# Patient Record
Sex: Male | Born: 1945 | Race: Black or African American | Hispanic: No | Smoking: Never smoker
Health system: Southern US, Community
[De-identification: ages and names within clinical notes are randomized; demographics above are authoritative.]

## PROBLEM LIST (undated history)

## (undated) DIAGNOSIS — M1990 Unspecified osteoarthritis, unspecified site: Secondary | ICD-10-CM

## (undated) DIAGNOSIS — N400 Enlarged prostate without lower urinary tract symptoms: Secondary | ICD-10-CM

## (undated) HISTORY — PX: TONSILLECTOMY: SUR1361

## (undated) HISTORY — PX: CYST REMOVAL NECK: SHX6281

## (undated) HISTORY — DX: Benign prostatic hyperplasia without lower urinary tract symptoms: N40.0

## (undated) HISTORY — PX: BIOPSY PROSTATE: PRO28

## (undated) HISTORY — PX: CHOLECYSTECTOMY: SHX55

---

## 2000-05-06 ENCOUNTER — Ambulatory Visit (HOSPITAL_COMMUNITY): Admission: RE | Admit: 2000-05-06 | Discharge: 2000-05-06 | Payer: Self-pay | Admitting: *Deleted

## 2002-01-28 ENCOUNTER — Emergency Department (HOSPITAL_COMMUNITY): Admission: EM | Admit: 2002-01-28 | Discharge: 2002-01-28 | Payer: Self-pay | Admitting: Emergency Medicine

## 2002-01-28 ENCOUNTER — Encounter: Payer: Self-pay | Admitting: Emergency Medicine

## 2016-12-08 ENCOUNTER — Encounter: Payer: Self-pay | Admitting: Family Medicine

## 2016-12-08 ENCOUNTER — Ambulatory Visit (INDEPENDENT_AMBULATORY_CARE_PROVIDER_SITE_OTHER): Payer: Medicare Other | Admitting: Family Medicine

## 2016-12-08 DIAGNOSIS — R351 Nocturia: Secondary | ICD-10-CM

## 2016-12-08 DIAGNOSIS — N401 Enlarged prostate with lower urinary tract symptoms: Secondary | ICD-10-CM | POA: Diagnosis not present

## 2016-12-08 DIAGNOSIS — E669 Obesity, unspecified: Secondary | ICD-10-CM

## 2016-12-08 DIAGNOSIS — R012 Other cardiac sounds: Secondary | ICD-10-CM | POA: Diagnosis not present

## 2016-12-08 DIAGNOSIS — E66811 Obesity, class 1: Secondary | ICD-10-CM

## 2016-12-08 DIAGNOSIS — N400 Enlarged prostate without lower urinary tract symptoms: Secondary | ICD-10-CM

## 2016-12-08 HISTORY — DX: Benign prostatic hyperplasia without lower urinary tract symptoms: N40.0

## 2016-12-08 NOTE — Assessment & Plan Note (Addendum)
Check EKG today; will refer for echocardiogram

## 2016-12-08 NOTE — Patient Instructions (Addendum)
Let's request your vaccine record from the last provider Return for a Medicare Wellness visit in the next few months Start a coated baby 81 mg aspirin daily for heart protection

## 2016-12-08 NOTE — Assessment & Plan Note (Addendum)
managed by urologist

## 2016-12-08 NOTE — Progress Notes (Signed)
BP 112/78   Pulse 91   Temp 97.6 F (36.4 C) (Oral)   Resp 14   Ht 5\' 10"  (1.778 m)   Wt 209 lb 12.8 oz (95.2 kg)   SpO2 96%   BMI 30.10 kg/m    Subjective:    Patient ID: Colin Barron, male    DOB: 01-Apr-1946, 71 y.o.   MRN: [id number]  HPI: Colin Barron is a 71 y.o. male  Chief Complaint  Patient presents with  . Establish Care    HPI Patient is here to establish care He really does not have much in the way of major health issues His last provider moved  He takes medicine for his prostate, flomax; he goes every six months to see the specialist; his prostate is usually swollen but last time, everything was good; they check his urine and blood work there for that; they have done a couple of biopsies; [facility]  Today's BP is excellent; never had a problem with his BP; usually 117/76 or something close  He gets flu shots every year; he thinks when he turned [age], he already got the flu shot, not absolutely certain  [facility] 12/08/2016  Decreased Interest 0  Down, Depressed, Hopeless 0  PHQ - 2 Score 0   Relevant past medical, surgical, family and social history reviewed Past Medical History:  Diagnosis Date  . BPH (benign prostatic hyperplasia) 12/08/2016    Past Surgical History:  Procedure Laterality Date  . BIOPSY PROSTATE    . CYST REMOVAL NECK    . TONSILLECTOMY     Family History  Problem Relation Age of Onset  . Diabetes Mother   . Hypertension Mother   . Intracerebral hemorrhage Brother   . Hypertension Brother   . Stroke Maternal Grandmother   . Stroke Maternal Grandfather   . Hypertension Brother        POSS   Social History   Social History  . Marital status: Divorced    Spouse name: N/A  . Number of children: N/A  . Years of education: N/A   Occupational History  . Not on file.   Social History Main Topics  . Smoking status: Never Smoker  . Smokeless tobacco: Never Used  . Alcohol use  No  . Drug use: No  . Sexual activity: Yes   Other Topics Concern  . Not on file   Social History Narrative  . No narrative on file    Interim medical history since last visit reviewed. Allergies and medications reviewed  Review of Systems Per HPI unless specifically indicated above     Objective:    BP 112/78   Pulse 91   Temp 97.6 F (36.4 C) (Oral)   Resp 14   Ht 5\' 10"  (1.778 m)   Wt 209 lb 12.8 oz (95.2 kg)   SpO2 96%   BMI 30.10 kg/m   Wt Readings from Last 3 Encounters:  12/08/16 209 lb 12.8 oz (95.2 kg)    Physical Exam  Constitutional: He appears well-developed and well-nourished. No distress.  HENT:  Head: Normocephalic and atraumatic.  Eyes: EOM are normal. No scleral icterus.  Neck: No thyromegaly present.  Cardiovascular: Normal rate and regular rhythm.   Pulmonary/Chest: Effort normal and breath sounds normal.  Abdominal: Soft. Bowel sounds are normal. He exhibits no distension.  Musculoskeletal: He exhibits no edema.  Neurological: Coordination normal.  Skin: Skin is warm and dry. No pallor.  Psychiatric: He has a normal mood and affect. His behavior is normal. Judgment and thought content normal.   No results found for this or any previous visit.    Assessment & Plan:   Problem List Items Addressed This Visit      Genitourinary   BPH (benign prostatic hyperplasia)    managed by urologist      Relevant Medications   tamsulosin (FLOMAX) 0.4 MG CAPS capsule     Other   Obesity (BMI 30.0-34.9)    Encouraged modest weight loss to help prevent problems down the road      Split S2 (second heart sound)    Check EKG today; will refer for echocardiogram      Relevant Orders   EKG 12-Lead (Completed)   ECHOCARDIOGRAM COMPLETE       Follow up plan: Return in about 2 months (around 02/07/2017) for Medicare Wellness check.  An after-visit summary was printed and given to the patient at check-out.  Please see the patient instructions  which may contain other information and recommendations beyond what is mentioned above in the assessment and plan.  Meds ordered this encounter  Medications  . tamsulosin (FLOMAX) 0.4 MG CAPS capsule    Sig: Take 0.4 mg by mouth daily.  . Multiple Vitamin (MULTIVITAMIN) tablet    Sig: Take 1 tablet by mouth daily.  Marland Kitchen aspirin EC 81 MG tablet    Sig: Take 1 tablet (81 mg total) by mouth daily.    Orders Placed This Encounter  Procedures  . EKG 12-Lead  . ECHOCARDIOGRAM COMPLETE

## 2016-12-08 NOTE — Assessment & Plan Note (Signed)
Encouraged modest weight loss to help prevent problems down the road

## 2016-12-17 ENCOUNTER — Telehealth: Payer: Self-pay | Admitting: Family Medicine

## 2016-12-17 NOTE — Telephone Encounter (Signed)
I decided to get an echo for the split S2; left detailed message; I have ordered an echo to investigate the heart sound noted during his exam; do not think anything is worrisome but want to investigate; please call with questions or comments

## 2017-02-07 ENCOUNTER — Ambulatory Visit: Payer: Medicare Other

## 2017-02-08 ENCOUNTER — Telehealth: Payer: Self-pay | Admitting: Family Medicine

## 2017-02-28 ENCOUNTER — Ambulatory Visit: Payer: Medicare Other

## 2017-03-04 ENCOUNTER — Ambulatory Visit: Payer: Medicare Other

## 2017-06-01 NOTE — Telephone Encounter (Signed)
Visit complete.

## 2017-11-24 ENCOUNTER — Telehealth: Payer: Self-pay

## 2017-11-24 NOTE — Telephone Encounter (Signed)
Called pt to sched AWV w/ [facility]. LVM requesting returned call.  

## 2017-12-05 ENCOUNTER — Ambulatory Visit (INDEPENDENT_AMBULATORY_CARE_PROVIDER_SITE_OTHER): Payer: Medicare Other | Admitting: Family Medicine

## 2017-12-05 ENCOUNTER — Encounter: Payer: Self-pay | Admitting: Family Medicine

## 2017-12-05 DIAGNOSIS — Z0000 Encounter for general adult medical examination without abnormal findings: Secondary | ICD-10-CM | POA: Diagnosis not present

## 2017-12-05 NOTE — Assessment & Plan Note (Signed)
Offered to check lipids and glucose; patient declined labs recommended today

## 2017-12-05 NOTE — Progress Notes (Signed)
Patient: Colin Barron, Male    DOB: 11/21/1945, 72 y.o.   MRN: [id number]  Visit Date: 12/05/2017  Today's Provider: Baruch Gouty, MD   Chief Complaint  Patient presents with  . Medicare Wellness    Subjective:   Colin Barron is a 72 y.o. male who presents today for his Subsequent Annual Wellness Visit.  Patient was seen last August; no medical excitement since then reported  Noticed a place of discoloration to left side of face that he feels has spread; states started 9 months ago but is not as visible now. Not itching or painful. Childhood acne  USPSTF grade A and B recommendations Depression:  Depression screen [facility] 2/9 12/05/2017 12/08/2016  Decreased Interest 0 0  Down, Depressed, Hopeless 0 0  PHQ - 2 Score 0 0   Hypertension: BP Readings from Last 3 Encounters:  12/05/17 116/72  12/08/16 112/78   Obesity: Wt Readings from Last 3 Encounters:  12/05/17 205 lb 14.4 oz (93.4 kg)  12/08/16 209 lb 12.8 oz (95.2 kg)   BMI Readings from Last 3 Encounters:  12/05/17 28.72 kg/m  12/08/16 30.10 kg/m    Immunizations: States he got a pneumonia vaccine at 16; discussed vaccines Skin cancer: no history of such; is not wear sunscreen but is not in the sun a lot.  Lung cancer:  No history of smoking, does not qualify  Prostate cancer:  Dad and brother have prostate cancer; follows up with urology every 6 months for surveillance.- takes flomax PRN for flow issues.  No results found for: PSA Colorectal cancer: States last colonscopy was 6 years ago from [facility] and was negative; mother had cancer, but "carcinoma", not sure if colon; see AVS AAA: past age Aspirin: states was taking it in the past but has forgotten to take it. Diet: Started eating more fruits and vegetables, eats chicken, fish, beans. Eats about 1-2 fruits or vegetables a day.  Drinks water and flavored drinks like koolaid 2-3 times a week.  Exercise: walks and mows the yard and works around the house;  states active 7 days a week about 45 minutes.  Alcohol: never Tobacco use:  never HIV, hep B, hep C: sexually active- is in a monogamous relationship with one person, declines testing STD testing and prevention (chl/gon/syphilis): declines  Glucose:  No results found for: GLUCOSE, GLUCAP Lipids:  No results found for: CHOL No results found for: HDL No results found for: LDLCALC No results found for: TRIG No results found for: CHOLHDL No results found for: LDLDIRECT   Review of Systems  Past Medical History:  Diagnosis Date  . BPH (benign prostatic hyperplasia) 12/08/2016    Past Surgical History:  Procedure Laterality Date  . BIOPSY PROSTATE    . CYST REMOVAL NECK    . TONSILLECTOMY      Family History  Problem Relation Age of Onset  . Diabetes Mother   . Hypertension Mother   . Intracerebral hemorrhage Brother   . Hypertension Brother   . Stroke Maternal Grandmother   . Stroke Maternal Grandfather   . Hypertension Brother        POSS    Social History   Tobacco Use  . Smoking status: Never Smoker  . Smokeless tobacco: Never Used  Substance Use Topics  . Alcohol use: No  . Drug use: No    Outpatient Encounter Medications as of 12/05/2017  Medication Sig Note  . tamsulosin (FLOMAX) 0.4 MG CAPS capsule Take 0.4 mg by mouth  daily.   . [DISCONTINUED] aspirin EC 81 MG tablet Take 1 tablet (81 mg total) by mouth daily. 12/05/2017: forgets to take it  . Multiple Vitamin (MULTIVITAMIN) tablet Take 1 tablet by mouth daily.    No facility-administered encounter medications on file as of 12/05/2017.     Functional Ability / Safety Screening 1.  Was the timed Get Up and Go test less than 12 seconds?  yes 2.  Does the patient need help with the phone, transportation, shopping,      preparing meals, housework, laundry, medications, or managing money?  no 3.  Does the patient's home have:  loose throw rugs in the hallway?   no      Grab bars in the bathroom? no       Handrails on the stairs?   yes      Good lighting?   yes 4.  Has the patient noticed any hearing difficulties?   no  Advanced Directives Does patient have a HCPOA?    no If yes, name and contact information: - states would not like to discuss this at this time.  Does patient have a living will or MOST form?  no  Immunizations: may need PCV-13, shringix  Fall Risk Assessment See under rooming  Depression Screen See under rooming [facility] screen [facility] 12/05/2017 12/08/2016  Decreased Interest 0 0  Down, Depressed, Hopeless 0 0  PHQ - 2 Score 0 0    Objective:   Vitals: BP 116/72   Pulse 75   Temp 97.8 F (36.6 C)   Ht 5\' 11"  (1.803 m)   Wt 205 lb 14.4 oz (93.4 kg)   SpO2 96%   BMI 28.72 kg/m  Body mass index is 28.72 kg/m. No exam data present  Physical Exam Mood/affect:  Calm, good eye contact with examiner; euthymic Appearance:  Neatly dressed, casual, good hygiene  No flowsheet data found.  Assessment & Plan:     Annual Wellness Visit  Reviewed patient's Family Medical History Reviewed and updated list of patient's medical providers Assessment of cognitive impairment was done Assessed patient's functional ability Established a written schedule for health screening services Health Risk Assessent Completed and Reviewed  Exercise Activities and Dietary recommendations Goals    . DIET - EAT MORE FRUITS AND VEGETABLES (pt-stated)       Immunization History  Administered Date(s) Administered  . Influenza, High Dose Seasonal PF 01/28/2017    Health Maintenance  Topic Date Due  . INFLUENZA VACCINE  01/05/2018 (Originally 11/24/2017)  . Hepatitis C Screening  12/06/2018 (Originally [birth year])  . PNA vac Low Risk Adult (1 of 2 - PCV13) 12/06/2018 (Originally 04/22/2010)  . COLONOSCOPY  04/26/2021  . TETANUS/TDAP  04/26/2026    Discussed health benefits of physical activity, and encouraged him to engage in regular exercise appropriate for his age and  condition.   No orders of the defined types were placed in this encounter.   Current Outpatient Medications:  .  tamsulosin (FLOMAX) 0.4 MG CAPS capsule, Take 0.4 mg by mouth daily., Disp: , Rfl:  .  Multiple Vitamin (MULTIVITAMIN) tablet, Take 1 tablet by mouth daily., Disp: , Rfl:  Medications Discontinued During This Encounter  Medication Reason  . aspirin EC 81 MG tablet Patient Preference    Next Medicare Wellness Visit in 12+ months  Problem List Items Addressed This Visit      Other   Medicare annual wellness visit, subsequent    USPSTF grade A and B recommendations reviewed  with patient; age-appropriate recommendations, preventive care, screening tests, etc discussed and encouraged; healthy living encouraged; see AVS for patient education given to patient

## 2017-12-05 NOTE — Assessment & Plan Note (Signed)
USPSTF grade A and B recommendations reviewed with patient; age-appropriate recommendations, preventive care, screening tests, etc discussed and encouraged; healthy living encouraged; see AVS for patient education given to patient  

## 2017-12-05 NOTE — Patient Instructions (Addendum)
I will recommend the pneumonia vaccine called Prevnar (PCV-13) You can talk to your pharmacist about it and get it with your flu shot if desired  Consider getting the new shingles vaccine called Shingrix; that is available for individuals 73 years of age and older, and is recommended even if you have had shingles in the past and/or already received the old shingles vaccine (Zostavax); it is a two-part series, and is available at many local pharmacies  Check to see if your mother had colon cancer; if she did, you are DUE for a colonoscopy because you should get screened every FIVE Let me know if I can make that referral for you  Health Maintenance, Male A healthy lifestyle and preventive care is important for your health and wellness. Ask your health care provider about what schedule of regular examinations is right for you. What should I know about weight and diet? Eat a Healthy Diet  Eat plenty of vegetables, fruits, whole grains, low-fat dairy products, and lean protein.  Do not eat a lot of foods high in solid fats, added sugars, or salt.  Maintain a Healthy Weight Regular exercise can help you achieve or maintain a healthy weight. You should:  Do at least 150 minutes of exercise each week. The exercise should increase your heart rate and make you sweat (moderate-intensity exercise).  Do strength-training exercises at least twice a week.  Watch Your Levels of Cholesterol and Blood Lipids  Have your blood tested for lipids and cholesterol every 5 years starting at 73 years of age. If you are at high risk for heart disease, you should start having your blood tested when you are 73 years old. You may need to have your cholesterol levels checked more often if: ? Your lipid or cholesterol levels are high. ? You are older than 73 years of age. ? You are at high risk for heart disease.  What should I know about cancer screening? Many types of cancers can be detected early and may  often be prevented. Lung Cancer  You should be screened every year for lung cancer if: ? You are a current smoker who has smoked for at least 30 years. ? You are a former smoker who has quit within the past 15 years.  Talk to your health care provider about your screening options, when you should start screening, and how often you should be screened.  Colorectal Cancer  Routine colorectal cancer screening usually begins at 73 years of age and should be repeated every 5-10 years until you are 70104 years old. You may need to be screened more often if early forms of precancerous polyps or small growths are found. Your health care provider may recommend screening at an earlier age if you have risk factors for colon cancer.  Your health care provider may recommend using home test kits to check for hidden blood in the stool.  A small camera at the end of a tube can be used to examine your colon (sigmoidoscopy or colonoscopy). This checks for the earliest forms of colorectal cancer.  Prostate and Testicular Cancer  Depending on your age and overall health, your health care provider may do certain tests to screen for prostate and testicular cancer.  Talk to your health care provider about any symptoms or concerns you have about testicular or prostate cancer.  Skin Cancer  Check your skin from head to toe regularly.  Tell your health care provider about any new moles or changes in moles,  especially if: ? There is a change in a mole's size, shape, or color. ? You have a mole that is larger than a pencil eraser.  Always use sunscreen. Apply sunscreen liberally and repeat throughout the day.  Protect yourself by wearing long sleeves, pants, a wide-brimmed hat, and sunglasses when outside.  What should I know about heart disease, diabetes, and high blood pressure?  If you are 7018-73 years of age, have your blood pressure checked every 3-5 years. If you are 73 years of age or older, have your  blood pressure checked every year. You should have your blood pressure measured twice-once when you are at a hospital or clinic, and once when you are not at a hospital or clinic. Record the average of the two measurements. To check your blood pressure when you are not at a hospital or clinic, you can use: ? An automated blood pressure machine at a pharmacy. ? A home blood pressure monitor.  Talk to your health care provider about your target blood pressure.  If you are between 8145-71105 years old, ask your health care provider if you should take aspirin to prevent heart disease.  Have regular diabetes screenings by checking your fasting blood sugar level. ? If you are at a normal weight and have a low risk for diabetes, have this test once every three years after the age of 11045. ? If you are overweight and have a high risk for diabetes, consider being tested at a younger age or more often.  A one-time screening for abdominal aortic aneurysm (AAA) by ultrasound is recommended for men aged 65-75 years who are current or former smokers. What should I know about preventing infection? Hepatitis B If you have a higher risk for hepatitis B, you should be screened for this virus. Talk with your health care provider to find out if you are at risk for hepatitis B infection. Hepatitis C Blood testing is recommended for:  Everyone born from [id number] through 1966.  Anyone with known risk factors for hepatitis C.  Sexually Transmitted Diseases (STDs)  You should be screened each year for STDs including gonorrhea and chlamydia if: ? You are sexually active and are younger than 73 years of age. ? You are older than 73 years of age and your health care provider tells you that you are at risk for this type of infection. ? Your sexual activity has changed since you were last screened and you are at an increased risk for chlamydia or gonorrhea. Ask your health care provider if you are at risk.  Talk with your  health care provider about whether you are at high risk of being infected with HIV. Your health care provider may recommend a prescription medicine to help prevent HIV infection.  What else can I do?  Schedule regular health, dental, and eye exams.  Stay current with your vaccines (immunizations).  Do not use any tobacco products, such as cigarettes, chewing tobacco, and e-cigarettes. If you need help quitting, ask your health care provider.  Limit alcohol intake to no more than 2 drinks per day. One drink equals 12 ounces of beer, 5 ounces of wine, or 1 ounces of hard liquor.  Do not use street drugs.  Do not share needles.  Ask your health care provider for help if you need support or information about quitting drugs.  Tell your health care provider if you often feel depressed.  Tell your health care provider if you have ever been abused or  do not feel safe at home. This information is not intended to replace advice given to you by your health care provider. Make sure you discuss any questions you have with your health care provider. Document Released: 10/09/2007 Document Revised: 12/10/2015 Document Reviewed: 01/14/2015 Elsevier Interactive Patient Education  [employer].

## 2018-07-24 ENCOUNTER — Encounter: Payer: Self-pay | Admitting: Family Medicine

## 2018-10-03 ENCOUNTER — Emergency Department (HOSPITAL_COMMUNITY): Payer: Medicare Other

## 2018-10-03 ENCOUNTER — Encounter (HOSPITAL_COMMUNITY): Payer: Self-pay | Admitting: Emergency Medicine

## 2018-10-03 ENCOUNTER — Other Ambulatory Visit: Payer: Self-pay

## 2018-10-03 ENCOUNTER — Emergency Department (HOSPITAL_COMMUNITY)
Admission: EM | Admit: 2018-10-03 | Discharge: 2018-10-03 | Disposition: A | Discharge status: Home / Self Care | Payer: Medicare Other | Attending: Emergency Medicine | Admitting: Emergency Medicine

## 2018-10-03 DIAGNOSIS — Z79899 Other long term (current) drug therapy: Secondary | ICD-10-CM | POA: Insufficient documentation

## 2018-10-03 DIAGNOSIS — N401 Enlarged prostate with lower urinary tract symptoms: Secondary | ICD-10-CM | POA: Diagnosis not present

## 2018-10-03 DIAGNOSIS — R1030 Lower abdominal pain, unspecified: Secondary | ICD-10-CM | POA: Diagnosis not present

## 2018-10-03 DIAGNOSIS — R338 Other retention of urine: Secondary | ICD-10-CM

## 2018-10-03 DIAGNOSIS — R339 Retention of urine, unspecified: Secondary | ICD-10-CM | POA: Insufficient documentation

## 2018-10-03 LAB — CBC WITH DIFFERENTIAL/PLATELET
Abs Immature Granulocytes: 0.03 10*3/uL (ref 0.00–0.07)
Basophils Absolute: 0 10*3/uL (ref 0.0–0.1)
Basophils Relative: 0 %
Eosinophils Absolute: 0 10*3/uL (ref 0.0–0.5)
Eosinophils Relative: 0 %
HCT: 42.8 % (ref 39.0–52.0)
Hemoglobin: 14.8 g/dL (ref 13.0–17.0)
Immature Granulocytes: 0 %
Lymphocytes Relative: 7 %
Lymphs Abs: 0.7 10*3/uL (ref 0.7–4.0)
MCH: 31.8 pg (ref 26.0–34.0)
MCHC: 34.6 g/dL (ref 30.0–36.0)
MCV: 92 fL (ref 80.0–100.0)
Monocytes Absolute: 0.6 10*3/uL (ref 0.1–1.0)
Monocytes Relative: 6 %
Neutro Abs: 8.1 10*3/uL — ABNORMAL HIGH (ref 1.7–7.7)
Neutrophils Relative %: 87 %
Platelets: 150 10*3/uL (ref 150–400)
RBC: 4.65 MIL/uL (ref 4.22–5.81)
RDW: 12.3 % (ref 11.5–15.5)
WBC: 9.4 10*3/uL (ref 4.0–10.5)
nRBC: 0 % (ref 0.0–0.2)

## 2018-10-03 LAB — URINALYSIS, ROUTINE W REFLEX MICROSCOPIC
Bilirubin Urine: NEGATIVE
Glucose, UA: NEGATIVE mg/dL
Ketones, ur: 5 mg/dL — AB
Leukocytes,Ua: NEGATIVE
Nitrite: NEGATIVE
Protein, ur: NEGATIVE mg/dL
Specific Gravity, Urine: 1.008 (ref 1.005–1.030)
pH: 6 (ref 5.0–8.0)

## 2018-10-03 LAB — BASIC METABOLIC PANEL
Anion gap: 12 (ref 5–15)
BUN: 7 mg/dL — ABNORMAL LOW (ref 8–23)
CO2: 22 mmol/L (ref 22–32)
Calcium: 9.2 mg/dL (ref 8.9–10.3)
Chloride: 103 mmol/L (ref 98–111)
Creatinine, Ser: 0.87 mg/dL (ref 0.61–1.24)
GFR calc Af Amer: >60 mL/min (ref >60)
GFR calc non Af Amer: >60 mL/min (ref >60)
Glucose, Bld: 112 mg/dL — ABNORMAL HIGH (ref 70–99)
Potassium: 3.5 mmol/L (ref 3.5–5.1)
Sodium: 137 mmol/L (ref 135–145)

## 2018-10-03 IMAGING — CT CT ABDOMEN AND PELVIS WITH CONTRAST
2 of 5 series · 16 of 46 positions shown, 18 images · IV contrast (APPLIED)
Comparison: CT scan of [DATE].

CLINICAL DATA: Acute generalized abdominal pain.

EXAM:
CT ABDOMEN AND PELVIS WITH CONTRAST
TECHNIQUE: Multidetector CT imaging of the abdomen and pelvis was performed
using the standard protocol following bolus administration of
intravenous contrast.
CONTRAST:  100mL OMNIPAQUE IOHEXOL 300 MG/ML  SOLN

[Series 3: abd/ pelvis 5.0 i30f 2 · axial · 0.87mm/px · z∈[+798,+1244]mm · 13 of 99 slices shown, 15 images]
[im 5/99  soft-tissue]
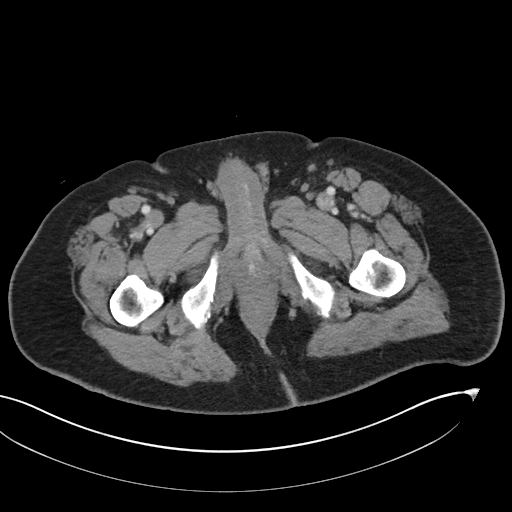
[im 5/99  bone]
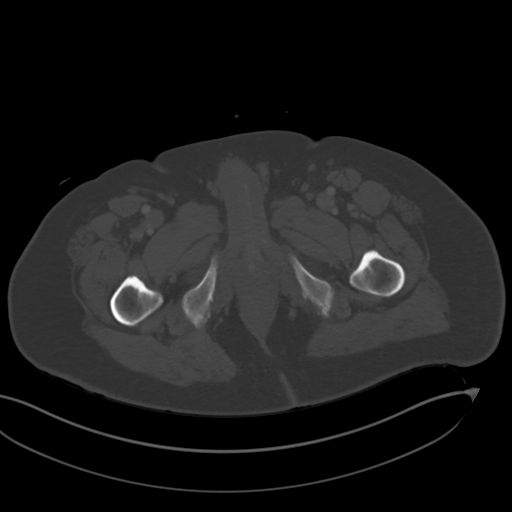
[im 15/99  soft-tissue]
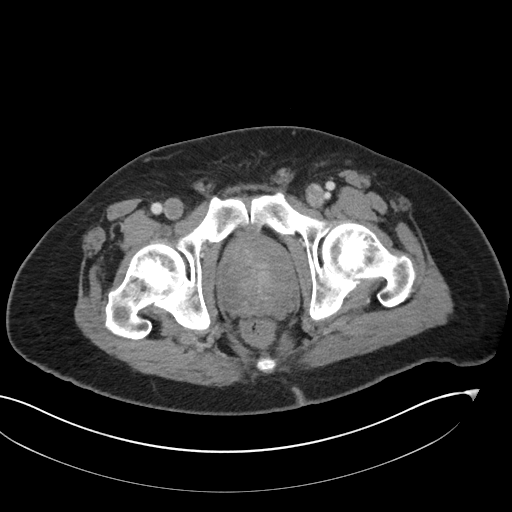
[im 19/99  soft-tissue]
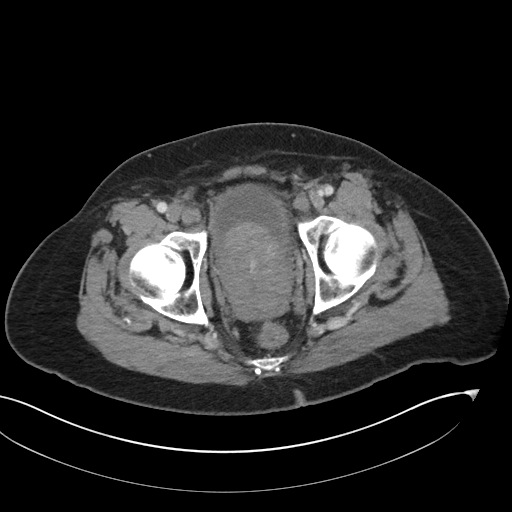
[im 29/99  soft-tissue]
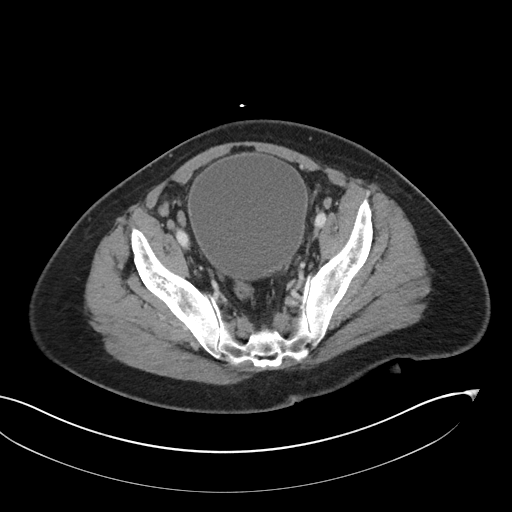
[im 33/99  soft-tissue]
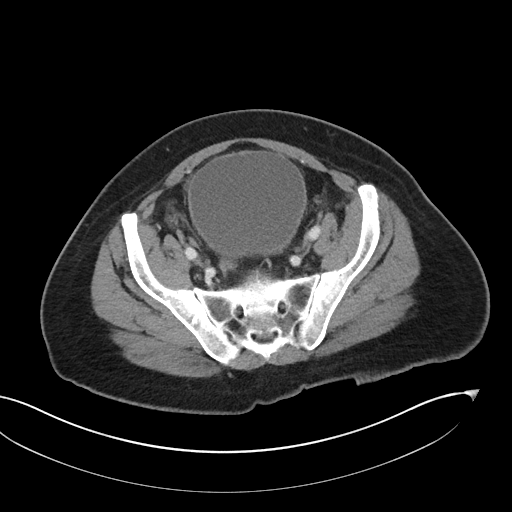
[im 43/99  soft-tissue]
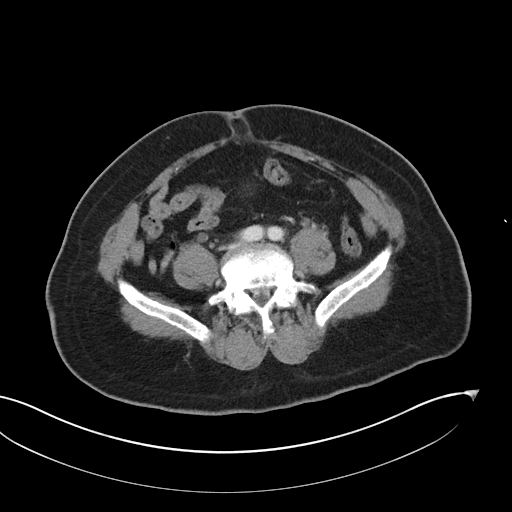
[im 52/99  soft-tissue]
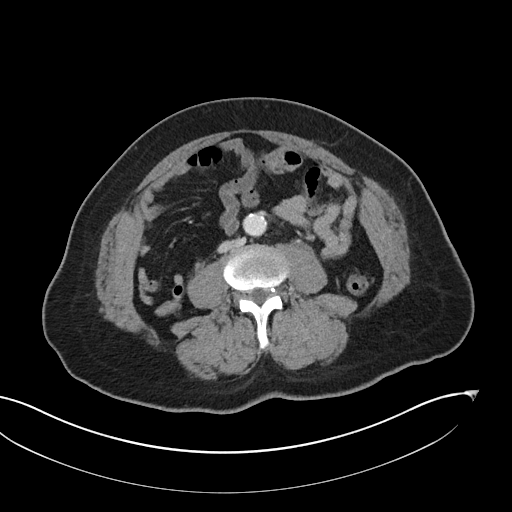
[im 57/99  soft-tissue]
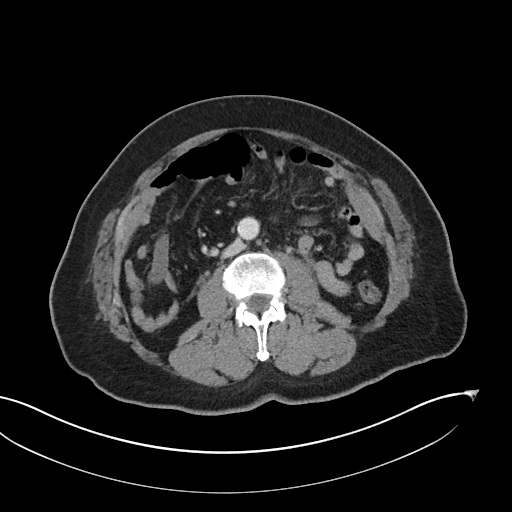
[im 66/99  soft-tissue]
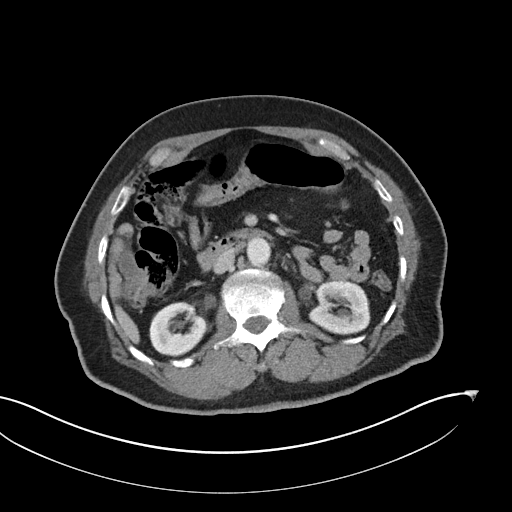
[im 66/99  bone]
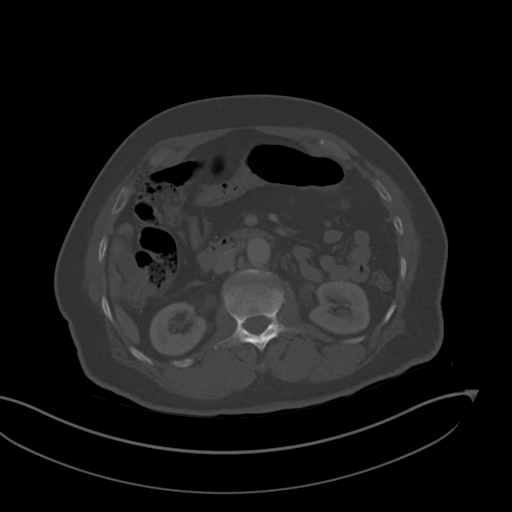
[im 71/99  soft-tissue]
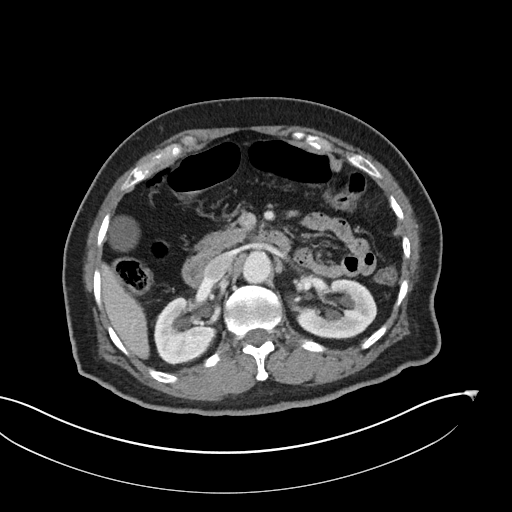
[im 80/99  soft-tissue]
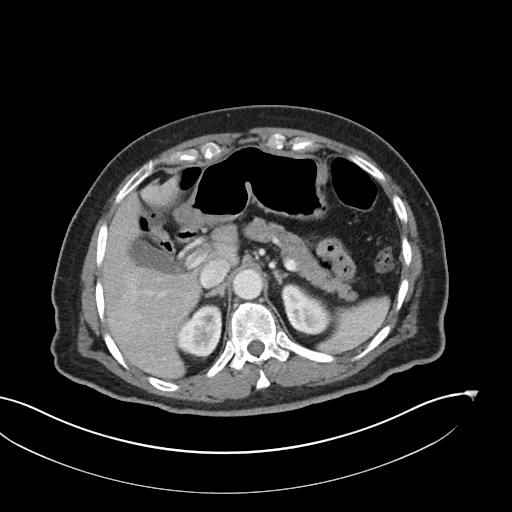
[im 85/99  soft-tissue]
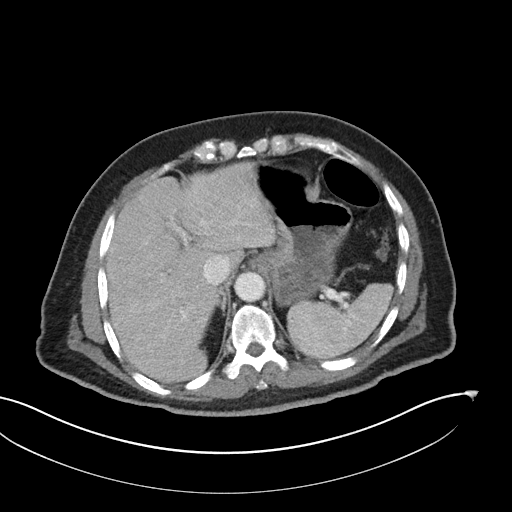
[im 94/99  soft-tissue]
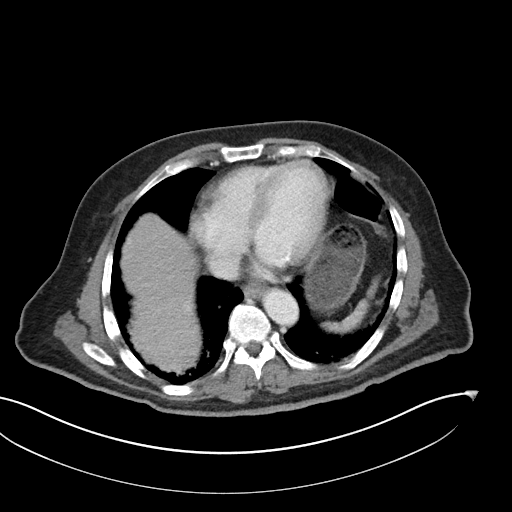

[Series 6: coronal soft tissue · coronal · 0.80mm/px · 3 of 99 slices shown]
[im 33/99  soft-tissue]
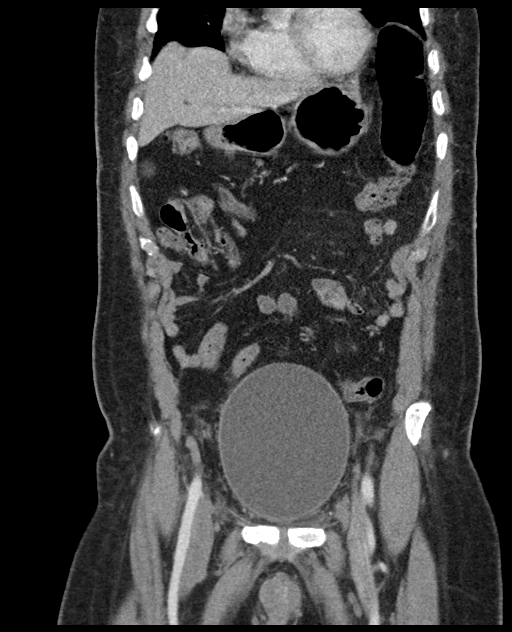
[im 44/99  soft-tissue]
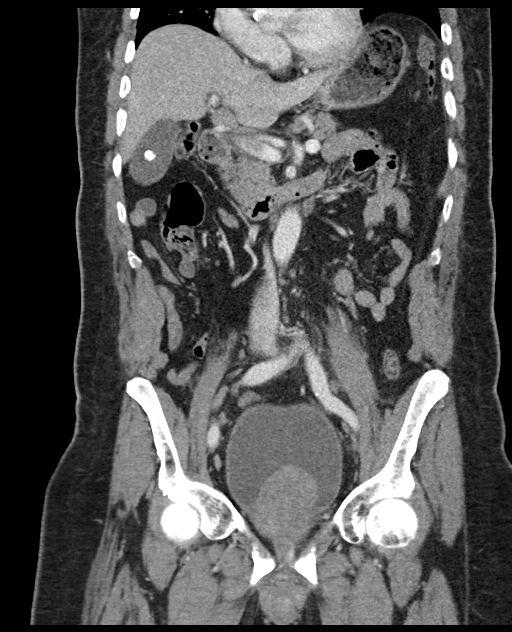
[im 55/99  soft-tissue]
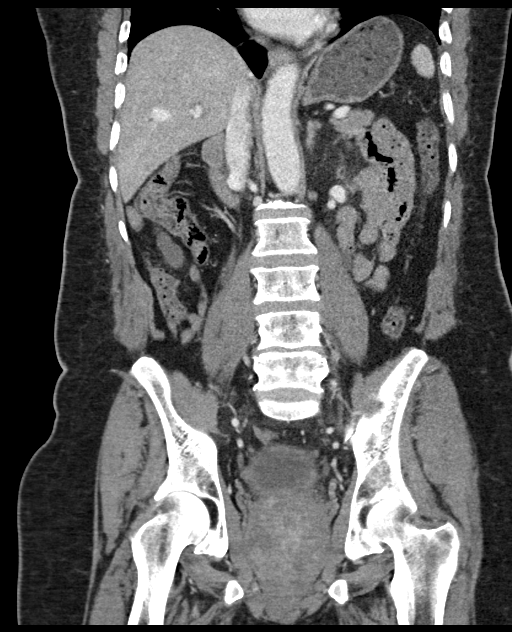

[16 of 46 positions shown; findings below may reference images not displayed]

FINDINGS: Lower chest: No acute abnormality.

Hepatobiliary: Solitary gallstone is noted. No biliary dilatation is
noted. 1.8 cm enhancing abnormality is noted in right hepatic lobe.

Pancreas: Unremarkable. No pancreatic ductal dilatation or
surrounding inflammatory changes.

Spleen: Normal in size without focal abnormality.

Adrenals/Urinary Tract: Adrenal glands appear normal. Bilateral
renal cysts are noted. Mild bilateral hydroureteronephrosis is noted
without obstructing calculus. Mild urinary bladder distention is
noted, most likely due to bladder outlet obstruction secondary to
severely enlarged prostate gland.

Stomach/Bowel: The stomach appears normal. There is no evidence of
bowel obstruction or inflammation. The appendix is not visualized.

Vascular/Lymphatic: Aortic atherosclerosis. No enlarged abdominal or
pelvic lymph nodes.

Reproductive: As noted above, severely enlarged prostate gland is
noted with extension into inferior portion of bladder.

Other: No abdominal wall hernia or abnormality. No abdominopelvic
ascites.

Musculoskeletal: No acute or significant osseous findings.
IMPRESSION: Mild bilateral hydroureteronephrosis is noted without obstructing
calculus. This appears to be due to mild urinary bladder distention,
which most likely is due to bladder outlet obstruction secondary to
severely enlarged prostate gland.

Solitary gallstone is noted.

1.8 cm enhancing abnormality is noted in right hepatic lobe. MRI of
the liver with and without gadolinium administration is recommended
on nonemergent basis to rule out potential neoplasm.

Aortic Atherosclerosis ([QW]-[QW]).

## 2018-10-03 MED ORDER — IOHEXOL 300 MG/ML  SOLN
100.0000 mL | Freq: Once | INTRAMUSCULAR | Status: AC | PRN
  Administered 2018-10-03: 100 mL via INTRAVENOUS

## 2018-10-03 NOTE — ED Notes (Signed)
RN notified brother of plan and that pt is not up for admission or discharge at this time.

## 2018-10-03 NOTE — ED Notes (Signed)
Pt returned from CT °

## 2018-10-03 NOTE — ED Notes (Signed)
Brother notified pt is up for discharge and will be ready in approximately 30-40 minutes.

## 2018-10-03 NOTE — Discharge Instructions (Addendum)
Thank you for allowing me to care for you today. Please return to the emergency department if you have new or worsening symptoms. Take your medications as instructed.  ° °

## 2018-10-03 NOTE — ED Notes (Signed)
Leven Hoel (brother) [phone]

## 2018-10-03 NOTE — ED Triage Notes (Signed)
Pt states he went for a colonoscopy today and has not been able to urinate since the procedure. Pt states he thinks he has not urinated since last night. Pt states he has the urge to urinate but cannot.

## 2018-10-03 NOTE — ED Notes (Signed)
Charge RN notified of placement of foley.

## 2018-10-03 NOTE — ED Notes (Signed)
Patient verbalizes understanding of discharge instructions. Opportunity for questioning and answers were provided. Armband removed by staff, pt discharged from ED. Pt and brother given instructions on foley care and follow up. Pt wheeled to lobby and taken home by brother.

## 2018-10-03 NOTE — ED Notes (Signed)
PT bladder scanned at Greater than 200ML

## 2018-10-03 NOTE — ED Provider Notes (Addendum)
[facility] Provider Note   CSN: [id number] Arrival date & time: 10/03/18  1600    History   Chief Complaint No chief complaint on file.   HPI Colin Barron is a 73 y.o. male.     HPI  73 year old male with history of BPH comes in a chief complaint of urinary discomfort and retention.  Patient states that he had colonoscopy earlier today.  He has not been able to urinate since yesterday and is having some abdominal discomfort.  The abdominal discomfort started few hours after the colonoscopy.  He denies any history of Foley catheter placement or complication from anesthesia.  Past Medical History:  Diagnosis Date  . BPH (benign prostatic hyperplasia) 12/08/2016    Patient Active Problem List   Diagnosis Date Noted  . Medicare annual wellness visit, subsequent 12/05/2017  . 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 BPH (benign prostatic hyperplasia) 12/08/2016    Past Surgical History:  Procedure Laterality Date  . BIOPSY PROSTATE    . CYST REMOVAL NECK    . TONSILLECTOMY          Home Medications    Prior to Admission medications   Medication Sig Start Date End Date Taking? Authorizing Provider  tamsulosin (FLOMAX) 0.4 MG CAPS capsule Take 0.4 mg by mouth daily.   Yes [provider]    Family History Family History  Problem Relation Age of Onset  . Diabetes Mother   . Hypertension Mother   . Intracerebral hemorrhage Brother   . Hypertension Brother   . Stroke Maternal Grandmother   . Stroke Maternal Grandfather   . Hypertension Brother        POSS    Social History Social History   Tobacco Use  . Smoking status: Never Smoker  . Smokeless tobacco: Never Used  Substance Use Topics  . Alcohol use: No  . Drug use: No     Allergies   Amoxicillin   Review of Systems Review of Systems  Constitutional: Positive for activity change.  Respiratory: Negative for shortness of breath.    Cardiovascular: Negative for chest pain.  Gastrointestinal: Positive for abdominal pain.  Genitourinary: Positive for difficulty urinating.  All other systems reviewed and are negative.    Physical Exam Updated Vital Signs BP (!) 159/85   Pulse 67   Temp 98.6 F (37 C) (Oral)   Resp 15   Ht 5\' 11"  (1.803 m)   Wt 93.9 kg   SpO2 96%   BMI 28.87 kg/m   Physical Exam Vitals signs and nursing note reviewed.  Constitutional:      Appearance: He is well-developed.  HENT:     Head: Atraumatic.  Neck:     Musculoskeletal: Neck supple.  Cardiovascular:     Rate and Rhythm: Normal rate.  Pulmonary:     Effort: Pulmonary effort is normal.  Abdominal:     Palpations: Abdomen is soft.     Tenderness: There is abdominal tenderness. There is guarding. There is no rebound.     Comments: Lower quadrant abdominal tenderness with guarding.  Skin:    General: Skin is warm.  Neurological:     Mental Status: He is alert and oriented to person, place, and time.      ED Treatments / Results  Labs (all labs ordered are listed, but only abnormal results are displayed) Labs Reviewed  BASIC METABOLIC PANEL - Abnormal; Notable for the following components:  Result Value   Glucose, Bld 112 (*)    BUN 7 (*)    All other components within normal limits  CBC WITH DIFFERENTIAL/PLATELET - Abnormal; Notable for the following components:   Neutro Abs 8.1 (*)    All other components within normal limits  URINE CULTURE  URINALYSIS, ROUTINE W REFLEX MICROSCOPIC    EKG None  Radiology No results found.  Procedures Procedures (including critical care time)  Medications Ordered in ED Medications - No data to display   Initial Impression / Assessment and Plan / ED Course  I have reviewed the triage vital signs and the nursing notes.  Pertinent labs & imaging results that were available during my care of the patient were reviewed by me and considered in my medical decision making  (see chart for details).  Clinical Course as of Oct 04 1526  Tue Oct 03, 2018  1908 Just had colonoscopy today. Hx BPH. Has not urinated since last night. Diffuse abd tenderness. Plan to CT to make sure there is no complication from scope. If neg, place foley and get UA and culture. Urology f/u.    [KM]  2108 I called [phone] and attempted to speak with patient's brother, Aline Brochure to discuss findings and plan. I received no answer.I left voicemail to call me back.  Patient has distended bladder on CT scan with mild bilateral hydronephrosis and enlarged prostate which is consistent with the patient's inability to urinate.  We will place a Foley catheter.  Patient reports that he has a urologist already Dr. Dia Sitter will follow-up with the urologist.  We will send for a urinalysis and urine culture.  Lab work is reassuring with a normal CBC and BMP.   [KM]    Clinical Course User Index [KM] Alveria Apley, PA-C       73 year old with history of BPH comes in with chief complaint of lower quadrant abdominal pain and urinary retention.  He is status post colonoscopy from earlier today.  On exam he is noted to have lower quadrant abdominal tenderness with some guarding.  Initial concern was that likely patient is having urinary retention post anesthesia due to colonoscopy and that the pain is because of bladder spasm.  Bladder scan however reveals  inconsistent readings on postvoid residual between 100 to 200 cc and more than 200 cc.  Given the reduced postvoid residual findings on multiple attempts and the recent CT scan, we will get a CAT scan to ensure there is no complication of colonoscopy such as perforated viscus.  If the CT scan is negative for acute finding then we will put in a Foley catheter and discharge patient with either PCP or urology follow-up. APP to f.u.  Final Clinical Impressions(s) / ED Diagnoses   Final diagnoses:  None    ED Discharge Orders    None        Varney Biles, MD 10/03/18 Bevelyn Buckles, MD 10/04/18 [phone]

## 2018-10-03 NOTE — ED Notes (Signed)
PT bladder scanned at <200

## 2018-10-04 ENCOUNTER — Encounter: Payer: Self-pay | Admitting: Family Medicine

## 2018-10-05 LAB — URINE CULTURE: Culture: NO GROWTH

## 2018-10-27 ENCOUNTER — Other Ambulatory Visit: Payer: Self-pay

## 2018-10-27 ENCOUNTER — Emergency Department (HOSPITAL_COMMUNITY)
Admission: EM | Admit: 2018-10-27 | Discharge: 2018-10-27 | Disposition: A | Discharge status: Home / Self Care | Payer: Medicare Other | Attending: Emergency Medicine | Admitting: Emergency Medicine

## 2018-10-27 ENCOUNTER — Encounter (HOSPITAL_COMMUNITY): Payer: Self-pay

## 2018-10-27 DIAGNOSIS — Z79899 Other long term (current) drug therapy: Secondary | ICD-10-CM | POA: Diagnosis not present

## 2018-10-27 DIAGNOSIS — N390 Urinary tract infection, site not specified: Secondary | ICD-10-CM | POA: Diagnosis not present

## 2018-10-27 DIAGNOSIS — R339 Retention of urine, unspecified: Secondary | ICD-10-CM | POA: Insufficient documentation

## 2018-10-27 LAB — URINALYSIS, ROUTINE W REFLEX MICROSCOPIC
Bilirubin Urine: NEGATIVE
Glucose, UA: NEGATIVE mg/dL
Ketones, ur: NEGATIVE mg/dL
Nitrite: POSITIVE — AB
Protein, ur: 100 mg/dL — AB
RBC / HPF: >50 RBC/hpf — ABNORMAL HIGH (ref 0–5)
Specific Gravity, Urine: 1.012 (ref 1.005–1.030)
WBC, UA: >50 WBC/hpf — ABNORMAL HIGH (ref 0–5)
pH: 5 (ref 5.0–8.0)

## 2018-10-27 MED ORDER — CEPHALEXIN 500 MG PO CAPS: 500.0000 mg | ORAL_CAPSULE | Freq: Three times a day (TID) | ORAL | 0 refills | Status: AC

## 2018-10-27 NOTE — ED Provider Notes (Signed)
[facility] DEPT Provider Note   CSN: [id number] Arrival date & time: 10/27/18  0840    History   Chief Complaint Chief Complaint  Patient presents with  . Urinary Retention    HPI Colin Barron is a 73 y.o. male with past medical history of BPH, presenting to the emergency department with urinary retention.  Patient states he just had his Foley catheter removed yesterday at [facility] urology.  He states the plan was to self cath in and out at home, however he was unable to do so.  He has been able to void small amounts of urine since yesterday, however not much.  He states he has been having bladder spasms this morning.  He has had a small amount of blood in his urine and some burning with urination.  Denies fever, nausea, vomiting, or other complaints. He is followed by Dr. Roni Bread with [facility].     The history is provided by the patient.    Past Medical History:  Diagnosis Date  . BPH (benign prostatic hyperplasia) 12/08/2016    Patient Active Problem List   Diagnosis Date Noted  . Medicare annual wellness visit, subsequent 12/05/2017  . 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 BPH (benign prostatic hyperplasia) 12/08/2016    Past Surgical History:  Procedure Laterality Date  . BIOPSY PROSTATE    . CHOLECYSTECTOMY    . CYST REMOVAL NECK    . TONSILLECTOMY          Home Medications    Prior to Admission medications   Medication Sig Start Date End Date Taking? Authorizing Provider  tamsulosin (FLOMAX) 0.4 MG CAPS capsule Take 0.4 mg by mouth daily after supper.    Yes [provider]  cephALEXin (KEFLEX) 500 MG capsule Take 1 capsule (500 mg total) by mouth 3 (three) times daily for 7 days. 10/27/18 11/03/18  Demetria Iwai, [place] N, PA-C    Family History Family History  Problem Relation Age of Onset  . Diabetes Mother   . Hypertension Mother   . Intracerebral hemorrhage Brother   . Hypertension Brother   . Stroke  Maternal Grandmother   . Stroke Maternal Grandfather   . Hypertension Brother        POSS    Social History Social History   Tobacco Use  . Smoking status: Never Smoker  . Smokeless tobacco: Never Used  Substance Use Topics  . Alcohol use: No  . Drug use: No     Allergies   Amoxicillin   Review of Systems Review of Systems  Gastrointestinal: Positive for abdominal pain.  Genitourinary: Positive for difficulty urinating, dysuria and hematuria.  All other systems reviewed and are negative.    Physical Exam Updated Vital Signs BP 115/73   Pulse 73   Temp 98.3 F (36.8 C) (Oral)   Resp 15   Ht 5\' 11"  (1.803 m)   Wt 93 kg   SpO2 98%   BMI 28.59 kg/m   Physical Exam Vitals signs and nursing note reviewed.  Constitutional:      General: He is not in acute distress.    Appearance: He is well-developed.  HENT:     Head: Normocephalic and atraumatic.  Eyes:     Conjunctiva/sclera: Conjunctivae normal.  Cardiovascular:     Rate and Rhythm: Regular rhythm. Tachycardia present.  Pulmonary:     Effort: Pulmonary effort is normal. No respiratory distress.     Breath sounds: Normal breath sounds.  Abdominal:  General: Bowel sounds are normal.     Palpations: Abdomen is soft.     Tenderness: There is abdominal tenderness. There is no guarding or rebound.  Skin:    General: Skin is warm.  Neurological:     Mental Status: He is alert.  Psychiatric:        Behavior: Behavior normal.      ED Treatments / Results  Labs (all labs ordered are listed, but only abnormal results are displayed) Labs Reviewed  URINALYSIS, ROUTINE W REFLEX MICROSCOPIC - Abnormal; Notable for the following components:      Result Value   Color, Urine YELLOW (*)    APPearance TURBID (*)    Hgb urine dipstick LARGE (*)    Protein, ur 100 (*)    Nitrite POSITIVE (*)    Leukocytes,Ua MODERATE (*)    RBC / HPF >50 (*)    WBC, UA >50 (*)    Bacteria, UA MANY (*)    All other  components within normal limits  URINE CULTURE    EKG None  Radiology No results found.  Procedures Procedures (including critical care time)  Medications Ordered in ED Medications - No data to display   Initial Impression / Assessment and Plan / ED Course  I have reviewed the triage vital signs and the nursing notes.  Pertinent labs & imaging results that were available during my care of the patient were reviewed by me and considered in my medical decision making (see chart for details).        Patient with history of BPH, presenting with recurrent urinary retention.  He was seen at his urologist yesterday, and had his Foley catheter removed.  He was sent home with the intention of self cathing in and out to void, however he was unable to do so in the clinic he says and also at home.  He states he had 4 attempts yesterday and was unsuccessful.  He comes in with bladder spasms and urinary retention.  Prior to placing a coud catheter, his bladder scan showed about 560 cc of urine.  Urine appeared cloudy, however drained well.  UA consistent with infection.  Culture sent.  Will discharge with catheter in place and leg bag, as well as Keflex for UTI.  Instructed patient to follow-up closely with his urologist for further management.  Return precautions discussed.  Patient agreeable to plan and safe for discharge.  Patient was discussed with and evaluated by Dr. Rush Landmarkegeler.  Discussed results, findings, treatment and follow up. Patient advised of return precautions. Patient verbalized understanding and agreed with plan.   Final Clinical Impressions(s) / ED Diagnoses   Final diagnoses:  Urinary retention  Acute lower UTI    ED Discharge Orders         Ordered    cephALEXin (KEFLEX) 500 MG capsule  3 times daily     10/27/18 1134           Tamla Winkels, [place] N, [place] JerseyPA-C 10/27/18 1134    Tegeler, Canary Brimhristopher J, MD 10/27/18 [phone]

## 2018-10-27 NOTE — ED Notes (Signed)
Leg bag provided for patient upon discharge.

## 2018-10-27 NOTE — ED Triage Notes (Signed)
Patient reports that he has an obstruction and was told he could have surgery or have a catheter placed. Patient states his catheter was removed on Thursday. Patient has not urinated since then.

## 2018-10-27 NOTE — Discharge Instructions (Addendum)
Please read instructions below. Take your antibiotic, keflex, every 8 hours (3 times per day) until it is gone. Drink plenty of water. Schedule an appointment with your urologist as soon as possible for a visit. Return to the ER if you develop a fever, vomiting, back pain, or new or concerning symptoms.

## 2018-10-29 LAB — URINE CULTURE: Culture: >=100000 — AB

## 2018-10-30 ENCOUNTER — Telehealth: Payer: Self-pay | Admitting: Emergency Medicine

## 2018-10-30 NOTE — Telephone Encounter (Signed)
Post ED Visit - Positive Culture Follow-up: Successful Patient Follow-Up  Culture assessed and recommendations reviewed by:  []  Elenor Quinones, Pharm.D. []  Heide Guile, Pharm.D., BCPS AQ-ID []  Parks Neptune, Pharm.D., BCPS []  Alycia Rossetti, Pharm.D., BCPS []  [place], Pharm.D., BCPS, AAHIVP []  Legrand Como, Pharm.D., BCPS, AAHIVP []  Salome Arnt, PharmD, BCPS []  Johnnette Gourd, PharmD, BCPS []  Hughes Better, PharmD, BCPS []  Leeroy Cha, PharmD Karel Jarvis PharmD  Positive urine culture  []  Patient discharged without antimicrobial prescription and treatment is now indicated []  Organism is resistant to prescribed ED discharge antimicrobial []  Patient with positive blood cultures  Changes discussed with ED provider: Rodell Perna PA Symptom check, check to see if afebrile, if febrile needs to return to ED for reeval and possible blood cultures  Attempting to contact patient   Hazle Nordmann 10/30/2018, 1:48 PM

## 2018-10-30 NOTE — Progress Notes (Signed)
ED Antimicrobial Stewardship Positive Culture Follow Up   Colin Barron is an 73 y.o. male who presented to [facility] on 10/27/2018 with a chief complaint of  Chief Complaint  Patient presents with  . Urinary Retention    Recent Results (from the past 720 hour(s))  Urine culture     Status: None   Collection Time: 10/03/18  9:38 PM   Specimen: Urine, Catheterized  Result Value Ref Range Status   Specimen Description URINE, CATHETERIZED  Final   Special Requests NONE  Final   Culture   Final    NO GROWTH Performed at [facility], [address], [place], [place] [address]    Report Status 10/05/2018 FINAL  Final  Urine culture     Status: Abnormal   Collection Time: 10/27/18  9:53 AM   Specimen: Urine, Clean Catch  Result Value Ref Range Status   Specimen Description   Final    URINE, CLEAN CATCH Performed at [facility], [place] [address] [place], [place] [address]    Special Requests   Final    NONE Performed at [facility], [place] [address] [place], [place] [address]    Culture >=100,000 COLONIES/mL STAPHYLOCOCCUS AUREUS (A)  Final   Report Status 10/29/2018 FINAL  Final   Organism ID, Bacteria STAPHYLOCOCCUS AUREUS (A)  Final      Susceptibility   Staphylococcus aureus - MIC*    CIPROFLOXACIN <=0.5 SENSITIVE Sensitive     GENTAMICIN <=0.5 SENSITIVE Sensitive     NITROFURANTOIN <=16 SENSITIVE Sensitive     OXACILLIN <=0.25 SENSITIVE Sensitive     TETRACYCLINE <=1 SENSITIVE Sensitive     VANCOMYCIN 1 SENSITIVE Sensitive     TRIMETH/SULFA <=10 SENSITIVE Sensitive     CLINDAMYCIN <=0.25 SENSITIVE Sensitive     RIFAMPIN <=0.5 SENSITIVE Sensitive     Inducible Clindamycin NEGATIVE Sensitive     * >=100,000 COLONIES/mL STAPHYLOCOCCUS AUREUS    [x]  Treated with Keflex , organism sensitive to prescribed antimicrobial []  Patient discharged originally without antimicrobial agent and treatment is now indicated  But - with  Staph aureus in urine, concern for urosepsis.  Risk low with patient afebrile in ED, and no prosthetic devices.  ID pharmacist suggests call to patient, ensure afebrile, and if fever -not improved, would obtain blood cx.   ED Provider: Rodell Perna PA (telephone)   [name] 10/30/2018, 10:43 AM Clinical Pharmacist [phone]

## 2018-11-10 ENCOUNTER — Telehealth: Payer: Self-pay | Admitting: Emergency Medicine

## 2018-11-10 NOTE — Telephone Encounter (Signed)
Post ED Visit - Positive Culture Follow-up  Culture report reviewed by antimicrobial stewardship pharmacist: [place] Team []  Elenor Quinones, Pharm.D. []  Heide Guile, Pharm.D., BCPS AQ-ID []  Parks Neptune, Pharm.D., BCPS []  Alycia Rossetti, Pharm.D., BCPS []  [place], Pharm.D., BCPS, AAHIVP []  Legrand Como, Pharm.D., BCPS, AAHIVP []  Salome Arnt, PharmD, BCPS []  Johnnette Gourd, PharmD, BCPS []  Hughes Better, PharmD, BCPS []  Leeroy Cha, PharmD []  Laqueta Linden, PharmD, BCPS []  Albertina Parr, PharmD  [place] Team []  Leodis Sias, PharmD []  Lindell Spar, PharmD []  Royetta Asal, PharmD [x]  Graylin Shiver, Rph []  Rema Fendt) Glennon Mac, PharmD []  Arlyn Dunning, PharmD []  Netta Cedars, PharmD []  Dia Sitter, PharmD []  Leone Haven, PharmD []  Gretta Arab, PharmD []  Theodis Shove, PharmD []  Peggyann Juba, PharmD []  Reuel Boom, PharmD   Positive urine culture Treated with Cephalexin, organism sensitive to the same.Instructed to call patient to make sure still afebrile.   11/10/2018 - patient returned call after receiving letter. Denies any fever and reports following up with PMD and urology.    Sandi Raveling Ansley Stanwood 11/10/2018, 2:48 PM

## 2018-11-20 ENCOUNTER — Ambulatory Visit (INDEPENDENT_AMBULATORY_CARE_PROVIDER_SITE_OTHER): Payer: Medicare Other | Admitting: Nurse Practitioner

## 2018-11-20 ENCOUNTER — Encounter: Payer: Self-pay | Admitting: Nurse Practitioner

## 2018-11-20 ENCOUNTER — Other Ambulatory Visit: Payer: Self-pay

## 2018-11-20 VITALS — BP 117/80 | HR 80 | Ht 71.0 in | Wt 200.0 lb

## 2018-11-20 DIAGNOSIS — N401 Enlarged prostate with lower urinary tract symptoms: Secondary | ICD-10-CM

## 2018-11-20 DIAGNOSIS — R338 Other retention of urine: Secondary | ICD-10-CM | POA: Diagnosis not present

## 2018-11-20 DIAGNOSIS — I700 Atherosclerosis of aorta: Secondary | ICD-10-CM

## 2018-11-20 DIAGNOSIS — R609 Edema, unspecified: Secondary | ICD-10-CM

## 2018-11-20 NOTE — Progress Notes (Signed)
Virtual Visit via Video Note  I connected with Colin Barron on 11/20/18 at  9:40 AM EDT by a video enabled telemedicine application and verified that I am speaking with the correct person using two identifiers.   Staff discussed the limitations of evaluation and management by telemedicine and the availability of in person appointments. The patient expressed understanding and agreed to proceed.  Patient location: brother house  My location: work office Other people present: Romeo AppleHarrison Truss.  HPI  Patient has gone to the ER twice in the past 2 months for urinary retention on 10/03/2018 and 10/27/2927. He has a history of BPH and  Has been seeing [facility] in [place]. States he went in for a colonoscopy and states after colonoscopy was unable to urinate so he went to ER Urodynamic test- shows bladder is working fine. Urology states he may need surgery or to do self-catheterization but he is unable to self-cath. He has foley catheter placed. States he has mild swelling in bilateral ankles since starting 2 weeks ago after he was started on oxybutynin and finasteride. Has been doing foot massage and elevating feet and has had significant improvement.   He would like a second opinion- [facility] Earlene Plateravis Viprakasit; he has appointment for 11/28/2018.   Aortic atherosclerosis  Seen on CT scan done in ER.   PHQ2/9: Depression screen [facility] 2[facility] 11/20/2018 12/05/2017 12/08/2016  Decreased Interest 0 0 0  Down, Depressed, Hopeless 0 0 0  PHQ - 2 Score 0 0 0  Altered sleeping 0 - -  Tired, decreased energy 0 - -  Change in appetite 0 - -  Feeling bad or failure about yourself  0 - -  Trouble concentrating 0 - -  Moving slowly or fidgety/restless 0 - -  Suicidal thoughts 0 - -  PHQ-9 Score 0 - -  Difficult doing work/chores Not difficult at all - -    PHQ reviewed. Negative  Patient Active Problem List   Diagnosis Date Noted  . Medicare annual wellness visit, subsequent  12/05/2017  . 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 BPH (benign prostatic hyperplasia) 12/08/2016    Past Medical History:  Diagnosis Date  . BPH (benign prostatic hyperplasia) 12/08/2016    Past Surgical History:  Procedure Laterality Date  . BIOPSY PROSTATE    . CHOLECYSTECTOMY    . CYST REMOVAL NECK    . TONSILLECTOMY      Social History   Tobacco Use  . Smoking status: Never Smoker  . Smokeless tobacco: Never Used  Substance Use Topics  . Alcohol use: No     Current Outpatient Medications:  .  docusate sodium (COLACE) 100 MG capsule, Take 100 mg by mouth daily., Disp: , Rfl:  .  finasteride (PROSCAR) 5 MG tablet, Take 5 mg by mouth daily., Disp: , Rfl:  .  oxybutynin (DITROPAN) 5 MG tablet, Take 5 mg by mouth 3 (three) times daily as needed., Disp: , Rfl:  .  tamsulosin (FLOMAX) 0.4 MG CAPS capsule, Take 0.4 mg by mouth daily after supper. , Disp: , Rfl:   Allergies  Allergen Reactions  . Amoxicillin Other (See Comments)    Did it involve swelling of the face/tongue/throat, SOB, or low BP? No Did it involve sudden or severe rash/hives, skin peeling, or any reaction on the inside of your mouth or nose? No Did you need to seek medical attention at a hospital or doctor's office? No When did it last happen?Over 10 years  ago If all above answers are "NO", may proceed with cephalosporin use.  Brown Spots    ROS   No other specific complaints in a complete review of systems (except as listed in HPI above).  Objective  Vitals:   11/20/18 0933  BP: 117/80  Pulse: 80  SpO2: 96%  Weight: 200 lb (90.7 kg)  Height: 5\' 11"  (1.803 m)     Body mass index is 27.89 kg/m.  Nursing Note and Vital Signs reviewed.  Physical Exam  Constitutional: Patient appears well-developed and well-nourished. No distress.  HENT: Head: Normocephalic and atraumatic. Cardiovascular: Normal rate, bilateral mild ankle swelling.  Pulmonary/Chest: Effort normal   Musculoskeletal: Normal range of motion,  Neurological: alert and oriented, speech normal.  Psychiatric: Patient has a normal mood and affect. behavior is normal. Judgment and thought content normal.    Assessment & Plan 1. Benign prostatic hyperplasia with urinary retention Continue follow-up with urology.   2. Aortic atherosclerosis (HCC) Discussed diet, will check lipids at follow-up   3. Peripheral edema Limit sodium, 15-70mmHg compression stockings, elevate legs Discussed     Follow Up Instructions:    I discussed the assessment and treatment plan with the patient. The patient was provided an opportunity to ask questions and all were answered. The patient agreed with the plan and demonstrated an understanding of the instructions.   The patient was advised to call back or seek an in-person evaluation if the symptoms worsen or if the condition fails to improve as anticipated.  I provided 32 minutes of non-face-to-face time during this encounter.   Fredderick Severance, NP

## 2018-11-28 ENCOUNTER — Encounter: Payer: Self-pay | Admitting: Family Medicine

## 2018-12-08 ENCOUNTER — Encounter: Payer: Medicare Other | Admitting: Family Medicine

## 2018-12-18 ENCOUNTER — Other Ambulatory Visit: Payer: Self-pay

## 2018-12-18 ENCOUNTER — Ambulatory Visit (INDEPENDENT_AMBULATORY_CARE_PROVIDER_SITE_OTHER): Payer: Medicare Other | Admitting: Nurse Practitioner

## 2018-12-18 ENCOUNTER — Encounter: Payer: Self-pay | Admitting: Nurse Practitioner

## 2018-12-18 VITALS — BP 108/76 | HR 63 | Temp 96.9°F | Resp 14 | Ht 74.0 in | Wt 192.5 lb

## 2018-12-18 DIAGNOSIS — Z0000 Encounter for general adult medical examination without abnormal findings: Secondary | ICD-10-CM

## 2018-12-18 NOTE — Patient Instructions (Addendum)
Always:  - Increase water intake - Increase activity as tolerated.  _______________ Can Do always or sometimes:   - Metamucil Powder; 1 teaspon start once a day for 3 days then twice a day for 3 days then can go up to 3 times a day if needed.  - Take docusate sodium 100 mg twice a day  ________  Rarely just as needed; Please follow direction on box:   - Take Polyethylene glycol 17 grams per day  OR  - Sennosides 8.6 mg: 2 tablets orally once a day at bedtime   _______ If unable to have Bowel movement, having abdominal pain, or nausea and vomiting please get medical assistance immediately.  General recommendations: 150 minutes of physical activity weekly, eat two servings of fish weekly, eat one serving of tree nuts ( cashews, pistachios, pecans, almonds.Marland Kitchen) every other day, eat 6 servings of fruit/vegetables daily and drink plenty of water and avoid sweet beverages. Recommend at least 64 ounces of water daily.   Stay Safe in the [place] The majority of sun exposure occurs before age 65 and skin cancer can take 20 years or more to develop. Whether your sun bathing days are behind you or you still spend time pursuing the perfect tan, you should be concerned about skin cancer.  Remember, the sun's ultraviolet (UV) rays can reflect off water, sand, concrete and snow, and can reach below the water's surface. Certain types of UV light penetrate fog and clouds, so it's possible to get sunburn even on overcast days.  Avoid direct sunlight as much as possible during the peak sun hours, generally 10 a.m. to 3 p.m., or seek shade during this part of day. Wear broad-spectrum sunscreen - with an SPF of at least 30 - containing both UVA and UVB protection. Look for ingredients like [employer] (also known as avobenzone) or titanium dioxide on the label. Reapply sunscreen frequently, at least every two hours when outdoors, especially if you perspire or you've been swimming. Your best bet is to choose  water-resistant products that are more likely to stay on your skin. Wear lip balm with an SPF 15 or higher. Wear a hat and other protective clothing while in the sun. Tightly woven fibers and darker clothing generally provide more protection. Also, look for products approved by the American Academy of Dermatology. Wear UV-protective sunglasses.

## 2018-12-18 NOTE — Progress Notes (Signed)
Name: Colin CuretLeon H Barron   MRN: [id number]    DOB: Oct 29, 1945   Date:12/18/2018       Progress Note  Subjective  Chief Complaint  Chief Complaint  Patient presents with  . Medicare Wellness    HPI  Patient presents for annual CPE.  USPSTF grade A and B recommendations: Saw urologist for BPH with obstruction plans to have simple prostatectomy. He has urinary bag.  Diet:  Eats three meals a day Has good appetite Eats vegetables 2-3 days Eats fruits 1-2 times a day.  Water- drinks 8 glasses of water a day Exercise:  Limited activity with catheter bag Walks abot 35-40 minutes daily.   Depression: phq 9 is negative Depression screen [facility] 2/9 12/18/2018 11/20/2018 12/05/2017 12/08/2016  Decreased Interest 0 0 0 0  Down, Depressed, Hopeless 0 0 0 0  PHQ - 2 Score 0 0 0 0  Altered sleeping 0 0 - -  Tired, decreased energy 0 0 - -  Change in appetite 0 0 - -  Feeling bad or failure about yourself  0 0 - -  Trouble concentrating 0 0 - -  Moving slowly or fidgety/restless 0 0 - -  Suicidal thoughts 0 0 - -  PHQ-9 Score 0 0 - -  Difficult doing work/chores Not difficult at all Not difficult at all - -    Hypertension:  BP Readings from Last 3 Encounters:  12/18/18 108/76  11/20/18 117/80  10/27/18 137/73    Obesity: Wt Readings from Last 3 Encounters:  12/18/18 192 lb 8 oz (87.3 kg)  11/20/18 200 lb (90.7 kg)  10/27/18 205 lb (93 kg)   BMI Readings from Last 3 Encounters:  12/18/18 24.72 kg/m  11/20/18 27.89 kg/m  10/27/18 28.59 kg/m     Lipids:  No results found for: CHOL No results found for: HDL No results found for: LDLCALC No results found for: TRIG No results found for: CHOLHDL No results found for: LDLDIRECT Glucose:  Glucose, Bld  Date Value Ref Range Status  10/03/2018 112 (H) 70 - 99 mg/dL Final      [facility]  AUDIT-C Score  0      Divorced STD testing and prevention  (HIV/chl/gon/syphilis): denies  Hep C: negative   Skin cancer: discussed Colorectal cancer: up to date  Prostate cancer: seeing urologist  No results found for: PSA  Lung cancer:  Low Dose CT Chest recommended if Age 47-80 years, 30 pack-year currently smoking OR have quit w/in 15years. Patient does not qualify.   AAA:  The USPSTF recommends one-time screening with ultrasonography in men ages 7965 to 75 years who have ever smoked. Does not qualify   Advanced Care Planning: A voluntary discussion about advance care planning including the explanation and discussion of advance directives.  Discussed health care proxy and Living will, and the patient was able to identify a health care proxy as brother, [name] .  Patient does not have a living will at present time. If patient does have living will, I have requested they bring this to the clinic to be scanned in to their chart.  Patient Active Problem List   Diagnosis Date Noted  . Aortic atherosclerosis (HCC) 11/20/2018  . 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 BPH (benign prostatic hyperplasia) 12/08/2016    Past Surgical History:  Procedure Laterality Date  . BIOPSY PROSTATE    . CHOLECYSTECTOMY    . CYST REMOVAL NECK    .  TONSILLECTOMY      Family History  Problem Relation Age of Onset  . Diabetes Mother   . Hypertension Mother   . Intracerebral hemorrhage Brother   . Hypertension Brother   . Stroke Maternal Grandmother   . Stroke Maternal Grandfather   . Hypertension Brother        POSS    Social History   Socioeconomic History  . Marital status: Divorced    Spouse name: Not on file  . Number of children: Not on file  . Years of education: 63  . Highest education level: Bachelor's degree (e.g., BA, AB, BS)  Occupational History  . Not on file  Social Needs  . Financial resource strain: Not hard at all  . Food insecurity    Worry: Never true    Inability: Never true  . Transportation needs     Medical: No    Non-medical: No  Tobacco Use  . Smoking status: Never Smoker  . Smokeless tobacco: Never Used  Substance and Sexual Activity  . Alcohol use: No  . Drug use: No  . Sexual activity: Yes  Lifestyle  . Physical activity    Days per week: 0 days    Minutes per session: 0 min  . Stress: Only a little  Relationships  . Social connections    Talks on phone: More than three times a week    Gets together: More than three times a week    Attends religious service: Never    Active member of club or organization: No    Attends meetings of clubs or organizations: Never    Relationship status: Divorced  . Intimate partner violence    Fear of current or ex partner: No    Emotionally abused: No    Physically abused: No    Forced sexual activity: No  Other Topics Concern  . Not on file  Social History Narrative  . Not on file     Current Outpatient Medications:  .  docusate sodium (COLACE) 100 MG capsule, Take 100 mg by mouth daily., Disp: , Rfl:  .  finasteride (PROSCAR) 5 MG tablet, Take 5 mg by mouth daily., Disp: , Rfl:  .  tamsulosin (FLOMAX) 0.4 MG CAPS capsule, Take 0.4 mg by mouth daily after supper. , Disp: , Rfl:  .  oxybutynin (DITROPAN) 5 MG tablet, Take 5 mg by mouth 3 (three) times daily as needed., Disp: , Rfl:   Allergies  Allergen Reactions  . Amoxicillin Other (See Comments)    Did it involve swelling of the face/tongue/throat, SOB, or low BP? No Did it involve sudden or severe rash/hives, skin peeling, or any reaction on the inside of your mouth or nose? No Did you need to seek medical attention at a hospital or doctor's office? No When did it last happen?Over 10 years ago If all above answers are "NO", may proceed with cephalosporin use.  Brown Spots     Review of Systems  Constitutional: Negative for chills, fever and malaise/fatigue.  HENT: Negative for sore throat and tinnitus.   Respiratory: Negative for cough and shortness of breath.    Cardiovascular: Negative for chest pain, palpitations and leg swelling.  Gastrointestinal: Negative for abdominal pain, blood in stool, constipation and diarrhea.  Musculoskeletal: Negative for joint pain and myalgias.  Skin: Negative for rash.  Neurological: Negative for dizziness, tingling and headaches.  Psychiatric/Behavioral: The patient is not nervous/anxious and does not have insomnia.      Objective  Vitals:   12/18/18 1143  BP: 108/76  Pulse: 63  Resp: 14  Temp: (!) 96.9 F (36.1 C)  SpO2: 97%  Weight: 192 lb 8 oz (87.3 kg)  Height: 6\' 2"  (1.88 m)    Body mass index is 24.72 kg/m.  Physical Exam Constitutional: Patient appears well-developed and well-nourished. No distress.  HENT: Head: Normocephalic and atraumatic. Ears: B TMs ok, no erythema or effusion;  Eyes: Conjunctivae and EOM are normal. Pupils are equal, round, and reactive to light. No scleral icterus.  Neck: Normal range of motion. Neck supple. No JVD present. No thyromegaly present.  Cardiovascular: Normal rate, regular rhythm and normal heart sounds.  No murmur heard. mild BLE edema. Pulmonary/Chest: Effort normal and breath sounds normal. No respiratory distress. Abdominal: Soft. Bowel sounds are normal, no distension. There is no tenderness. no masses MALE GENITALIA: has urinary catheter palced RECTAL: Prostate normal size and consistency, no rectal masses or hemorrhoids Musculoskeletal: Normal range of motion, no joint effusions. No gross deformities Neurological: he is alert and oriented to person, place, and time. No cranial nerve deficit. Coordination, balance, strength, speech and gait are normal.  Skin: Skin is warm and dry. No rash noted. No erythema.  Psychiatric: Patient has a normal mood and affect. behavior is normal. Judgment and thought content normal.   Recent Results (from the past 2160 hour(s))  Basic metabolic panel     Status: Abnormal   Collection Time: 10/03/18  6:33 PM   Result Value Ref Range   Sodium 137 135 - 145 mmol/L   Potassium 3.5 3.5 - 5.1 mmol/L   Chloride 103 98 - 111 mmol/L   CO2 22 22 - 32 mmol/L   Glucose, Bld 112 (H) 70 - 99 mg/dL   BUN 7 (L) 8 - 23 mg/dL   Creatinine, Ser 1.610.87 0.61 - 1.24 mg/dL   Calcium 9.2 8.9 - 09.610.3 mg/dL   GFR calc non Af Amer >60 >60 mL/min   GFR calc Af Amer >60 >60 mL/min   Anion gap 12 5 - 15    Comment: Performed at [facility], [address] [place], [place] [address]  CBC with Differential     Status: Abnormal   Collection Time: 10/03/18  6:33 PM  Result Value Ref Range   WBC 9.4 4.0 - 10.5 K/uL   RBC 4.65 4.22 - 5.81 MIL/uL   Hemoglobin 14.8 13.0 - 17.0 g/dL   HCT 98.142.8 19.139.0 - 47.852.0 %   MCV 92.0 80.0 - 100.0 fL   MCH 31.8 26.0 - 34.0 pg   MCHC 34.6 30.0 - 36.0 g/dL   RDW 29.512.3 62.111.5 - 30.815.5 %   Platelets 150 150 - 400 K/uL   nRBC 0.0 0.0 - 0.2 %   Neutrophils Relative % 87 %   Neutro Abs 8.1 (H) 1.7 - 7.7 K/uL   Lymphocytes Relative 7 %   Lymphs Abs 0.7 0.7 - 4.0 K/uL   Monocytes Relative 6 %   Monocytes Absolute 0.6 0.1 - 1.0 K/uL   Eosinophils Relative 0 %   Eosinophils Absolute 0.0 0.0 - 0.5 K/uL   Basophils Relative 0 %   Basophils Absolute 0.0 0.0 - 0.1 K/uL   Immature Granulocytes 0 %   Abs Immature Granulocytes 0.03 0.00 - 0.07 K/uL    Comment: Performed at [facility], [address] [place], [place] [address]  Urine culture     Status: None   Collection Time: 10/03/18  9:38 PM  Specimen: Urine, Catheterized  Result Value Ref Range   Specimen Description URINE, CATHETERIZED    Special Requests NONE    Culture      NO GROWTH Performed at [facility], [address], [place], [place] [address]    Report Status 10/05/2018 FINAL   Urinalysis, Routine w reflex microscopic     Status: Abnormal   Collection Time: 10/03/18  9:43 PM  Result Value Ref Range   Color, Urine YELLOW YELLOW   APPearance CLEAR CLEAR   Specific Gravity, Urine 1.008 1.005 - 1.030   pH 6.0  5.0 - 8.0   Glucose, UA NEGATIVE NEGATIVE mg/dL   Hgb urine dipstick SMALL (A) NEGATIVE   Bilirubin Urine NEGATIVE NEGATIVE   Ketones, ur 5 (A) NEGATIVE mg/dL   Protein, ur NEGATIVE NEGATIVE mg/dL   Nitrite NEGATIVE NEGATIVE   Leukocytes,Ua NEGATIVE NEGATIVE   RBC / HPF 0-5 0 - 5 RBC/hpf   WBC, UA 0-5 0 - 5 WBC/hpf   Bacteria, UA RARE (A) NONE SEEN   Squamous Epithelial / LPF 0-5 0 - 5   Mucus PRESENT     Comment: Performed at [facility], [address] [place], [place] [address]  Urinalysis, Routine w reflex microscopic- may I&O cath if menses     Status: Abnormal   Collection Time: 10/27/18  9:53 AM  Result Value Ref Range   Color, Urine YELLOW (A) YELLOW    Comment: BIOCHEMICALS MAY BE AFFECTED BY COLOR   APPearance TURBID (A) CLEAR   Specific Gravity, Urine 1.012 1.005 - 1.030   pH 5.0 5.0 - 8.0   Glucose, UA NEGATIVE NEGATIVE mg/dL   Hgb urine dipstick LARGE (A) NEGATIVE   Bilirubin Urine NEGATIVE NEGATIVE   Ketones, ur NEGATIVE NEGATIVE mg/dL   Protein, ur 098 (A) NEGATIVE mg/dL   Nitrite POSITIVE (A) NEGATIVE   Leukocytes,Ua MODERATE (A) NEGATIVE   RBC / HPF >50 (H) 0 - 5 RBC/hpf   WBC, UA >50 (H) 0 - 5 WBC/hpf   Bacteria, UA MANY (A) NONE SEEN   WBC Clumps PRESENT     Comment: Performed at [facility], [address] [place], [place] [address]  Urine culture     Status: Abnormal   Collection Time: 10/27/18  9:53 AM   Specimen: Urine, Clean Catch  Result Value Ref Range   Specimen Description      URINE, CLEAN CATCH Performed at [facility], [address] [place], [place] [address]    Special Requests      NONE Performed at [facility], [address] [place], [place] [address]    Culture >=100,000 COLONIES/mL STAPHYLOCOCCUS AUREUS (A)    Report Status 10/29/2018 FINAL    Organism ID, Bacteria STAPHYLOCOCCUS AUREUS (A)       Susceptibility   Staphylococcus aureus - MIC*    CIPROFLOXACIN  <=0.5 SENSITIVE Sensitive     GENTAMICIN <=0.5 SENSITIVE Sensitive     NITROFURANTOIN <=16 SENSITIVE Sensitive     OXACILLIN <=0.25 SENSITIVE Sensitive     TETRACYCLINE <=1 SENSITIVE Sensitive     VANCOMYCIN 1 SENSITIVE Sensitive     TRIMETH/SULFA <=10 SENSITIVE Sensitive     CLINDAMYCIN <=0.25 SENSITIVE Sensitive     RIFAMPIN <=0.5 SENSITIVE Sensitive     Inducible Clindamycin NEGATIVE Sensitive     * >=100,000 COLONIES/mL STAPHYLOCOCCUS AUREUS      Fall Risk: Fall Risk  12/18/2018 11/20/2018 12/05/2017 12/08/2016  Falls in  the past year? 0 0 No No  Number falls in past yr: 0 0 - -  Injury with Fall? 0 0 - -      Functional Status Survey: Is the patient deaf or have difficulty hearing?: No Does the patient have difficulty seeing, even when wearing glasses/contacts?: No Does the patient have difficulty concentrating, remembering, or making decisions?: No Does the patient have difficulty walking or climbing stairs?: No Does the patient have difficulty dressing or bathing?: No Does the patient have difficulty doing errands alone such as visiting a doctor's office or shopping?: No   Assessment & Plan  1. Preventative health care - Lipid Profile - COMPLETE METABOLIC PANEL WITH GFR   -Prostate cancer screening and PSA options - following up with urology  -USPSTF grade A and B recommendations reviewed with patient; age-appropriate recommendations, preventive care, screening tests, etc discussed and encouraged; healthy living encouraged; see AVS for patient education given to patient -Discussed importance of 150 minutes of physical activity weekly, eat two servings of fish weekly, eat one serving of tree nuts ( cashews, pistachios, pecans, almonds.Marland Kitchen.) every other day, eat 6 servings of fruit/vegetables daily and drink plenty of water and avoid sweet beverages.

## 2018-12-19 LAB — COMPLETE METABOLIC PANEL WITH GFR
AG Ratio: 1.2 (calc) (ref 1.0–2.5)
ALT: 8 U/L — ABNORMAL LOW (ref 9–46)
AST: 14 U/L (ref 10–35)
Albumin: 4.2 g/dL (ref 3.6–5.1)
Alkaline phosphatase (APISO): 54 U/L (ref 35–144)
BUN: 7 mg/dL (ref 7–25)
CO2: 28 mmol/L (ref 20–32)
Calcium: 9.7 mg/dL (ref 8.6–10.3)
Chloride: 103 mmol/L (ref 98–110)
Creat: 0.85 mg/dL (ref 0.70–1.18)
GFR, Est African American: 100 mL/min/{1.73_m2} (ref >=60)
GFR, Est Non African American: 86 mL/min/{1.73_m2} (ref >=60)
Globulin: 3.5 g/dL (calc) (ref 1.9–3.7)
Glucose, Bld: 102 mg/dL — ABNORMAL HIGH (ref 65–99)
Potassium: 4.2 mmol/L (ref 3.5–5.3)
Sodium: 137 mmol/L (ref 135–146)
Total Bilirubin: 0.9 mg/dL (ref 0.2–1.2)
Total Protein: 7.7 g/dL (ref 6.1–8.1)

## 2018-12-19 LAB — LIPID PANEL
Cholesterol: 156 mg/dL (ref <200)
HDL: 50 mg/dL (ref >=40)
LDL Cholesterol (Calc): 88 mg/dL (calc)
Non-HDL Cholesterol (Calc): 106 mg/dL (calc) (ref <130)
Total CHOL/HDL Ratio: 3.1 (calc) (ref <5.0)
Triglycerides: 85 mg/dL (ref <150)

## 2019-02-20 ENCOUNTER — Other Ambulatory Visit: Payer: Self-pay

## 2019-02-20 ENCOUNTER — Ambulatory Visit (INDEPENDENT_AMBULATORY_CARE_PROVIDER_SITE_OTHER): Payer: Medicare Other

## 2019-02-20 VITALS — BP 118/78 | HR 92 | Temp 96.8°F | Resp 16 | Ht 71.0 in | Wt 185.7 lb

## 2019-02-20 DIAGNOSIS — Z0000 Encounter for general adult medical examination without abnormal findings: Secondary | ICD-10-CM | POA: Diagnosis not present

## 2019-02-20 NOTE — Progress Notes (Signed)
Subjective:   Colin Barron is a 73 y.o. male who presents for Medicare Annual/Subsequent preventive examination.  Review of Systems:   Cardiac Risk Factors include: advanced age (>3men, >42 women);male gender     Objective:    Vitals: BP 118/78 (BP Location: Left Arm, Patient Position: Sitting, Cuff Size: Normal)   Pulse 92   Temp (!) 96.8 F (36 C) (Temporal)   Resp 16   Ht 5\' 11"  (1.803 m)   Wt 185 lb 11.2 oz (84.2 kg)   SpO2 99%   BMI 25.90 kg/m   Body mass index is 25.9 kg/m.  Advanced Directives 02/20/2019 10/27/2018 10/03/2018 12/08/2016  Does Patient Have a Medical Advance Directive? No No No No  Would patient like information on creating a medical advance directive? No - Patient declined No - Patient declined - -    Tobacco Social History   Tobacco Use  Smoking Status Never Smoker  Smokeless Tobacco Never Used     Counseling given: Not Answered   Clinical Intake:  Pre-visit preparation completed: Yes  Pain : No/denies pain     BMI - recorded: 25.9 Nutritional Status: BMI 25 -29 Overweight Nutritional Risks: None Diabetes: No  How often do you need to have someone help you when you read instructions, pamphlets, or other written materials from your doctor or pharmacy?: 1 - Never  Interpreter Needed?: No  Information entered by :: [email or web address] LPN  Past Medical History:  Diagnosis Date  . BPH (benign prostatic hyperplasia) 12/08/2016   Past Surgical History:  Procedure Laterality Date  . BIOPSY PROSTATE    . CHOLECYSTECTOMY    . CYST REMOVAL NECK    . TONSILLECTOMY     Family History  Problem Relation Age of Onset  . Diabetes Mother   . Hypertension Mother   . Intracerebral hemorrhage Brother   . Hypertension Brother   . Stroke Maternal Grandmother   . Stroke Maternal Grandfather   . Hypertension Brother        POSS   Social History   Socioeconomic History  . Marital status: Divorced    Spouse name: Not on file  . Number of  children: 3  . Years of education: 60  . Highest education level: Bachelor's degree (e.g., BA, AB, BS)  Occupational History  . Not on file  Social Needs  . Financial resource strain: Not hard at all  . Food insecurity    Worry: Never true    Inability: Never true  . Transportation needs    Medical: No    Non-medical: No  Tobacco Use  . Smoking status: Never Smoker  . Smokeless tobacco: Never Used  Substance and Sexual Activity  . Alcohol use: No  . Drug use: No  . Sexual activity: Yes  Lifestyle  . Physical activity    Days per week: 4 days    Minutes per session: 40 min  . Stress: Only a little  Relationships  . Social connections    Talks on phone: More than three times a week    Gets together: More than three times a week    Attends religious service: Never    Active member of club or organization: No    Attends meetings of clubs or organizations: Never    Relationship status: Divorced  Other Topics Concern  . Not on file  Social History Narrative   Pt lives with his brother    Outpatient Encounter Medications as of 02/20/2019  Medication Sig  .  docusate sodium (COLACE) 100 MG capsule Take 100 mg by mouth daily.  . finasteride (PROSCAR) 5 MG tablet Take 5 mg by mouth daily.  . tamsulosin (FLOMAX) 0.4 MG CAPS capsule Take 0.4 mg by mouth daily after supper.   . [DISCONTINUED] oxybutynin (DITROPAN) 5 MG tablet Take 5 mg by mouth 3 (three) times daily as needed.   No facility-administered encounter medications on file as of 02/20/2019.     Activities of Daily Living In your present state of health, do you have any difficulty performing the following activities: 02/20/2019 12/18/2018  Hearing? N N  Comment declines hearing aids -  Vision? N N  Difficulty concentrating or making decisions? N N  Walking or climbing stairs? N N  Dressing or bathing? N N  Doing errands, shopping? N N  Preparing Food and eating ? N -  Using the Toilet? N -  In the past six  months, have you accidently leaked urine? Y -  Comment prostate surgery -  Do you have problems with loss of bowel control? N -  Managing your Medications? N -  Managing your Finances? N -  Housekeeping or managing your Housekeeping? N -  Some recent data might be hidden    Patient Care Team: Lada, Janit BernMelinda P, MD as PCP - General (Family Medicine)   Assessment:   This is a routine wellness examination for Colin Barron.  Exercise Activities and Dietary recommendations Current Exercise Habits: Home exercise routine, Type of exercise: walking, Time (Minutes): 40, Frequency (Times/Week): 4, Weekly Exercise (Minutes/Week): 160, Intensity: Moderate, Exercise limited by: None identified  Goals    . DIET - EAT MORE FRUITS AND VEGETABLES (pt-stated)       Fall Risk Fall Risk  02/20/2019 12/18/2018 11/20/2018 12/05/2017 12/08/2016  Falls in the past year? 0 0 0 No No  Number falls in past yr: 0 0 0 - -  Injury with Fall? 0 0 0 - -  Follow up Falls prevention discussed - - - -   FALL RISK PREVENTION PERTAINING TO THE HOME:  Any stairs in or around the home? Yes  If so, do they handrails? Yes   Home free of loose throw rugs in walkways, pet beds, electrical cords, etc? Yes  Adequate lighting in your home to reduce risk of falls? Yes   ASSISTIVE DEVICES UTILIZED TO PREVENT FALLS:  Life alert? No  Use of a cane, walker or w/c? No  Grab bars in the bathroom? No  Shower chair or bench in shower? No  Elevated toilet seat or a handicapped toilet? No   DME ORDERS:  DME order needed?  No   TIMED UP AND GO:  Was the test performed? Yes .  Length of time to ambulate 10 feet: 5 sec.   GAIT:  Appearance of gait: Gait stead-fast and without the use of an assistive device.   Education: Fall risk prevention has been discussed.  Intervention(s) required? No   Depression Screen PHQ 2/9 Scores 02/20/2019 12/18/2018 11/20/2018 12/05/2017  PHQ - 2 Score 0 0 0 0  PHQ- 9 Score - 0 0 -    Cognitive  Function     6CIT Screen 02/20/2019  What Year? 0 points  What month? 0 points  What time? 0 points  Count back from 20 0 points  Months in reverse 0 points  Repeat phrase 0 points  Total Score 0    Immunization History  Administered Date(s) Administered  . Influenza, High Dose Seasonal PF 01/28/2017  Qualifies for Shingles Vaccine? Yes . Due for Shingrix. Education has been provided regarding the importance of this vaccine. Pt has been advised to call insurance company to determine out of pocket expense. Advised may also receive vaccine at local pharmacy or Health Dept. Verbalized acceptance and understanding.  Tdap: Although this vaccine is not a covered service during a Wellness Exam, does the patient still wish to receive this vaccine today?  No .  Education has been provided regarding the importance of this vaccine. Advised may receive this vaccine at local pharmacy or Health Dept. Aware to provide a copy of the vaccination record if obtained from local pharmacy or Health Dept. Verbalized acceptance and understanding.  Flu Vaccine: Due for Flu vaccine. Does the patient want to receive this vaccine today?  No . Education has been provided regarding the importance of this vaccine but still declined. Advised may receive this vaccine at local pharmacy or Health Dept. Aware to provide a copy of the vaccination record if obtained from local pharmacy or Health Dept. Verbalized acceptance and understanding.  Pneumococcal Vaccine: Due for Pneumococcal vaccine. Does the patient want to receive this vaccine today?  No . Education has been provided regarding the importance of this vaccine but still declined. Advised may receive this vaccine at local pharmacy or Health Dept. Aware to provide a copy of the vaccination record if obtained from local pharmacy or Health Dept. Verbalized acceptance and understanding.   Screening Tests Health Maintenance  Topic Date Due  . INFLUENZA VACCINE   07/25/2019 (Originally 11/25/2018)  . PNA vac Low Risk Adult (1 of 2 - PCV13) 02/20/2020 (Originally 04/22/2010)  . TETANUS/TDAP  04/26/2026  . COLONOSCOPY  10/02/2028  . Hepatitis C Screening  Completed   Cancer Screenings:  Colorectal Screening: Completed 10/04/18. Repeat every 10 years;   Lung Cancer Screening: (Low Dose CT Chest recommended if Age 68-80 years, 30 pack-year currently smoking OR have quit w/in 15years.) does not qualify.   Additional Screening:  Hepatitis C Screening: does qualify; Completed 08/02/17  Vision Screening: Recommended annual ophthalmology exams for early detection of glaucoma and other disorders of the eye. Is the patient up to date with their annual eye exam?  Yes  Who is the provider or what is the name of the office in which the pt attends annual eye exams? Dr. Gershon Crane  Dental Screening: Recommended annual dental exams for proper oral hygiene  Community Resource Referral:  CRR required this visit?  No       Plan:    I have personally reviewed and addressed the Medicare Annual Wellness questionnaire and have noted the following in the patient's chart:  A. Medical and social history B. Use of alcohol, tobacco or illicit drugs  C. Current medications and supplements D. Functional ability and status E.  Nutritional status F.  Physical activity G. Advance directives H. List of other physicians I.  Hospitalizations, surgeries, and ER visits in previous 12 months J.  [place] such as hearing and vision if needed, cognitive and depression L. Referrals and appointments   In addition, I have reviewed and discussed with patient certain preventive protocols, quality metrics, and best practice recommendations. A written personalized care plan for preventive services as well as general preventive health recommendations were provided to patient.   Signed,  Clemetine Marker, LPN Nurse Health Advisor   Nurse Notes: pt doing well and appreciative  of visit today.

## 2019-02-20 NOTE — Patient Instructions (Signed)
Colin Barron , Thank you for taking time to come for your Medicare Wellness Visit. I appreciate your ongoing commitment to your health goals. Please review the following plan we discussed and let me know if I can assist you in the future.   Screening recommendations/referrals: Colonoscopy: done 10/03/18 Recommended yearly ophthalmology/optometry visit for glaucoma screening and checkup Recommended yearly dental visit for hygiene and checkup  Vaccinations: Influenza vaccine: postponed Pneumococcal vaccine: postponed Tdap vaccine: postponed Shingles vaccine: Shingrix discussed. Please contact your pharmacy for coverage information.   Advanced directives: Advance directive discussed with you today. Even though you declined this today please call our office should you change your mind and we can give you the proper paperwork for you to fill out.  Conditions/risks identified: Keep up the great work!  Next appointment: Please follow up in one year for your Medicare Annual Wellness visit.    Preventive Care 42 Years and Older, Male Preventive care refers to lifestyle choices and visits with your health care provider that can promote health and wellness. What does preventive care include?  A yearly physical exam. This is also called an annual well check.  Dental exams once or twice a year.  Routine eye exams. Ask your health care provider how often you should have your eyes checked.  Personal lifestyle choices, including:  Daily care of your teeth and gums.  Regular physical activity.  Eating a healthy diet.  Avoiding tobacco and drug use.  Limiting alcohol use.  Practicing safe sex.  Taking low doses of aspirin every day.  Taking vitamin and mineral supplements as recommended by your health care provider. What happens during an annual well check? The services and screenings done by your health care provider during your annual well check will depend on your age, overall  health, lifestyle risk factors, and family history of disease. Counseling  Your health care provider may ask you questions about your:  Alcohol use.  Tobacco use.  Drug use.  Emotional well-being.  Home and relationship well-being.  Sexual activity.  Eating habits.  History of falls.  Memory and ability to understand (cognition).  Work and work Statistician. Screening  You may have the following tests or measurements:  Height, weight, and BMI.  Blood pressure.  Lipid and cholesterol levels. These may be checked every 5 years, or more frequently if you are over 24 years old.  Skin check.  Lung cancer screening. You may have this screening every year starting at age 63 if you have a 30-pack-year history of smoking and currently smoke or have quit within the past 15 years.  Fecal occult blood test (FOBT) of the stool. You may have this test every year starting at age 30.  Flexible sigmoidoscopy or colonoscopy. You may have a sigmoidoscopy every 5 years or a colonoscopy every 10 years starting at age 74.  Prostate cancer screening. Recommendations will vary depending on your family history and other risks.  Hepatitis C blood test.  Hepatitis B blood test.  Sexually transmitted disease (STD) testing.  Diabetes screening. This is done by checking your blood sugar (glucose) after you have not eaten for a while (fasting). You may have this done every 1-3 years.  Abdominal aortic aneurysm (AAA) screening. You may need this if you are a current or former smoker.  Osteoporosis. You may be screened starting at age 69 if you are at high risk. Talk with your health care provider about your test results, treatment options, and if necessary, the need for  more tests. Vaccines  Your health care provider may recommend certain vaccines, such as:  Influenza vaccine. This is recommended every year.  Tetanus, diphtheria, and acellular pertussis (Tdap, Td) vaccine. You may need a Td  booster every 10 years.  Zoster vaccine. You may need this after age 67.  Pneumococcal 13-valent conjugate (PCV13) vaccine. One dose is recommended after age 61.  Pneumococcal polysaccharide (PPSV23) vaccine. One dose is recommended after age 45. Talk to your health care provider about which screenings and vaccines you need and how often you need them. This information is not intended to replace advice given to you by your health care provider. Make sure you discuss any questions you have with your health care provider. Document Released: 05/09/2015 Document Revised: 12/31/2015 Document Reviewed: 02/11/2015 Elsevier Interactive Patient Education  2017 [place] Prevention in the Home Falls can cause injuries. They can happen to people of all ages. There are many things you can do to make your home safe and to help prevent falls. What can I do on the outside of my home?  Regularly fix the edges of walkways and driveways and fix any cracks.  Remove anything that might make you trip as you walk through a door, such as a raised step or threshold.  Trim any bushes or trees on the path to your home.  Use bright outdoor lighting.  Clear any walking paths of anything that might make someone trip, such as rocks or tools.  Regularly check to see if handrails are loose or broken. Make sure that both sides of any steps have handrails.  Any raised decks and porches should have guardrails on the edges.  Have any leaves, snow, or ice cleared regularly.  Use sand or salt on walking paths during winter.  Clean up any spills in your garage right away. This includes oil or grease spills. What can I do in the bathroom?  Use night lights.  Install grab bars by the toilet and in the tub and shower. Do not use towel bars as grab bars.  Use non-skid mats or decals in the tub or shower.  If you need to sit down in the shower, use a plastic, non-slip stool.  Keep the floor dry. Clean up  any water that spills on the floor as soon as it happens.  Remove soap buildup in the tub or shower regularly.  Attach bath mats securely with double-sided non-slip rug tape.  Do not have throw rugs and other things on the floor that can make you trip. What can I do in the bedroom?  Use night lights.  Make sure that you have a light by your bed that is easy to reach.  Do not use any sheets or blankets that are too big for your bed. They should not hang down onto the floor.  Have a firm chair that has side arms. You can use this for support while you get dressed.  Do not have throw rugs and other things on the floor that can make you trip. What can I do in the kitchen?  Clean up any spills right away.  Avoid walking on wet floors.  Keep items that you use a lot in easy-to-reach places.  If you need to reach something above you, use a strong step stool that has a grab bar.  Keep electrical cords out of the way.  Do not use floor polish or wax that makes floors slippery. If you must use wax, use non-skid  floor wax.  Do not have throw rugs and other things on the floor that can make you trip. What can I do with my stairs?  Do not leave any items on the stairs.  Make sure that there are handrails on both sides of the stairs and use them. Fix handrails that are broken or loose. Make sure that handrails are as long as the stairways.  Check any carpeting to make sure that it is firmly attached to the stairs. Fix any carpet that is loose or worn.  Avoid having throw rugs at the top or bottom of the stairs. If you do have throw rugs, attach them to the floor with carpet tape.  Make sure that you have a light switch at the top of the stairs and the bottom of the stairs. If you do not have them, ask someone to add them for you. What else can I do to help prevent falls?  Wear shoes that:  Do not have high heels.  Have rubber bottoms.  Are comfortable and fit you well.  Are  closed at the toe. Do not wear sandals.  If you use a stepladder:  Make sure that it is fully opened. Do not climb a closed stepladder.  Make sure that both sides of the stepladder are locked into place.  Ask someone to hold it for you, if possible.  Clearly mark and make sure that you can see:  Any grab bars or handrails.  First and last steps.  Where the edge of each step is.  Use tools that help you move around (mobility aids) if they are needed. These include:  Canes.  Walkers.  Scooters.  Crutches.  Turn on the lights when you go into a dark area. Replace any light bulbs as soon as they burn out.  Set up your furniture so you have a clear path. Avoid moving your furniture around.  If any of your floors are uneven, fix them.  If there are any pets around you, be aware of where they are.  Review your medicines with your doctor. Some medicines can make you feel dizzy. This can increase your chance of falling. Ask your doctor what other things that you can do to help prevent falls. This information is not intended to replace advice given to you by your health care provider. Make sure you discuss any questions you have with your health care provider. Document Released: 02/06/2009 Document Revised: 09/18/2015 Document Reviewed: 05/17/2014 Elsevier Interactive Patient Education  2017 [employer].

## 2019-03-16 ENCOUNTER — Telehealth: Payer: Self-pay | Admitting: Family Medicine

## 2019-03-16 ENCOUNTER — Other Ambulatory Visit: Payer: Self-pay

## 2019-03-16 ENCOUNTER — Encounter: Payer: Self-pay | Admitting: Family Medicine

## 2019-03-16 ENCOUNTER — Ambulatory Visit (INDEPENDENT_AMBULATORY_CARE_PROVIDER_SITE_OTHER): Payer: Medicare Other | Admitting: Family Medicine

## 2019-03-16 VITALS — BP 122/84 | HR 78 | Temp 97.9°F | Resp 14 | Ht 71.0 in | Wt 186.9 lb

## 2019-03-16 DIAGNOSIS — R8290 Unspecified abnormal findings in urine: Secondary | ICD-10-CM

## 2019-03-16 LAB — POCT URINALYSIS DIPSTICK
Bilirubin, UA: NEGATIVE
Glucose, UA: NEGATIVE
Ketones, UA: NEGATIVE
Nitrite, UA: NEGATIVE
Protein, UA: POSITIVE — AB
Spec Grav, UA: 1.015 (ref 1.010–1.025)
Urobilinogen, UA: 1 E.U./dL
pH, UA: 6 (ref 5.0–8.0)

## 2019-03-16 MED ORDER — SULFAMETHOXAZOLE-TRIMETHOPRIM 800-160 MG PO TABS: 1.0000 | ORAL_TABLET | Freq: Two times a day (BID) | ORAL | 0 refills | Status: AC

## 2019-03-16 NOTE — Telephone Encounter (Signed)
Colin Barron has sent script

## 2019-03-16 NOTE — Telephone Encounter (Signed)
Pt was seen today and states that you where suppose to call an antibotic in. He is asking that you send it to [facility] because he is staying in town with his brother

## 2019-03-16 NOTE — Telephone Encounter (Signed)
Pt calling again.  Upset that medication is not at the pharmacy for him.  Pt would like a call from the office once medication has been called I to the [facility] so that he can know to go and pick it up.

## 2019-03-16 NOTE — Progress Notes (Signed)
Name: Colin Barron   MRN: [id number]    DOB: 11-14-1945   Date:03/16/2019       Progress Note  Subjective  Chief Complaint  Chief Complaint  Patient presents with  . Follow-up    had prostae surgery 7 weeks ago now have odor when urinating    HPI  Pt presents with his brother for concern for possible UTI.  His brother report noticing strong odor to his urine for several weeks now.  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 Barbee Cough with [facility].  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  Denies fevers/chills, abdominal pain, no frank hematuria, no back pain.  Still taking flomax and proscar.  Patient Active Problem List   Diagnosis Date Noted  . Aortic atherosclerosis ([place]) 11/20/2018  . 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 BPH (benign prostatic hyperplasia) 12/08/2016    Social History   Tobacco Use  . Smoking status: Never Smoker  . Smokeless tobacco: Never Used  Substance Use Topics  . Alcohol use: No    Current Outpatient Medications:  .  docusate sodium (COLACE) 100 MG capsule, Take 100 mg by mouth daily., Disp: , Rfl:  .  finasteride (PROSCAR) 5 MG tablet, Take 5 mg by mouth daily., Disp: , Rfl:  .  tamsulosin (FLOMAX) 0.4 MG CAPS capsule, Take 0.4 mg by mouth daily after supper. , Disp: , Rfl:   Allergies  Allergen Reactions  . Amoxicillin Other (See Comments)    Did it involve swelling of the face/tongue/throat, SOB, or low BP? No Did it involve sudden or severe rash/hives, skin peeling, or any reaction on the inside of your mouth or nose? No Did you need to seek medical attention at a hospital or doctor's office? No When did it last happen?Over 10 years ago If all above answers are "NO", may proceed with cephalosporin use.  [place]    I personally reviewed active problem list, medication list, allergies, notes from last encounter, lab results with the  patient/caregiver today.  ROS  Ten systems reviewed and is negative except as mentioned in HPI  Objective  Vitals:   03/16/19 0835  BP: 122/84  Pulse: 78  Resp: 14  Temp: 97.9 F (36.6 C)  TempSrc: Temporal  SpO2: 96%  Weight: 186 lb 14.4 oz (84.8 kg)  Height: 5\' 11"  (1.803 m)    Body mass index is 26.07 kg/m.  Nursing Note and Vital Signs reviewed.  Physical Exam  Constitutional: Patient appears well-developed and well-nourished. No distress.  HENT: Head: Normocephalic and atraumatic. Eyes: Conjunctivae and EOM are normal. Pupils are equal, round, and reactive to light. No scleral icterus.  Neck: Normal range of motion. Neck supple. No JVD present. No thyromegaly present.  Cardiovascular: Normal rate, regular rhythm and normal heart sounds.  No murmur heard. No BLE edema. Pulmonary/Chest: Effort normal and breath sounds normal. No respiratory distress. Abdominal: Soft. Bowel sounds are normal, no distension. There is no tenderness. no masses. No CVA tenderness. MALE GENITALIA: Normal descended testes bilaterally, no masses palpated, no hernias, no lesions, no discharge RECTAL: Deferred Musculoskeletal: Normal range of motion, no joint effusions. No gross deformities Neurological: he is alert and oriented to person, place, and time. No cranial nerve deficit. Coordination, balance, strength, speech and gait are normal.  Skin: Skin is warm and dry. No rash noted. No erythema.  Psychiatric: Patient has a normal mood and affect. behavior  is normal. Judgment and thought content normal.  Results for orders placed or performed in visit on 03/16/19 (from the past 72 hour(s))  POCT urinalysis dipstick     Status: Abnormal   Collection Time: 03/16/19  8:45 AM  Result Value Ref Range   Color, UA gold    Clarity, UA cloudy    Glucose, UA Negative Negative   Bilirubin, UA negative    Ketones, UA negative    Spec Grav, UA 1.015 1.010 - 1.025   Blood, UA moderate    pH, UA 6.0  5.0 - 8.0   Protein, UA Positive (A) Negative   Urobilinogen, UA 1.0 0.2 or 1.0 E.U./dL   Nitrite, UA negative    Leukocytes, UA Large (3+) (A) Negative   Appearance cloudy    Odor small     Assessment & Plan  1. Foul smelling urine - POCT urinalysis dipstick - sulfamethoxazole-trimethoprim (BACTRIM DS) 800-160 MG tablet; Take 1 tablet by mouth 2 (two) times daily for 5 days.  Dispense: 10 tablet; Refill: 0 - Will treat proactively due to UA and recent surgery. - Urine Culture  -Red flags and when to present for emergency care or RTC including fever >101.92F, chest pain, shortness of breath, new/worsening/un-resolving symptoms, reviewed with patient at time of visit. Follow up and care instructions discussed and provided in AVS.

## 2019-03-18 LAB — URINE CULTURE

## 2019-06-04 ENCOUNTER — Other Ambulatory Visit: Payer: Self-pay

## 2019-06-04 ENCOUNTER — Ambulatory Visit: Payer: Medicare PPO | Attending: Internal Medicine

## 2019-06-04 DIAGNOSIS — Z23 Encounter for immunization: Secondary | ICD-10-CM | POA: Insufficient documentation

## 2019-06-04 NOTE — Progress Notes (Signed)
   Covid-19 Vaccination Clinic  Name:  ABDULRAHMAN BRACEY    MRN: [id number] DOB: 06/25/1945  06/04/2019  Mr. Eilers was observed post Covid-19 immunization for 15 minutes without incidence. He was provided with Vaccine Information Sheet and instruction to access the V-Safe system.   Mr. Karim was instructed to call 911 with any severe reactions post vaccine: Marland Kitchen Difficulty breathing  . Swelling of your face and throat  . A fast heartbeat  . A bad rash all over your body  . Dizziness and weakness    Immunizations Administered    Name Date Dose VIS Date Route   Moderna COVID-19 Vaccine 06/04/2019 10:21 AM 0.5 mL 03/27/2019 Intramuscular   Manufacturer: Moderna   Lot: [id number]   NDC: 26948-546-27

## 2019-07-04 ENCOUNTER — Ambulatory Visit: Payer: Medicare PPO | Attending: Internal Medicine

## 2019-07-04 DIAGNOSIS — Z23 Encounter for immunization: Secondary | ICD-10-CM | POA: Insufficient documentation

## 2019-07-04 NOTE — Progress Notes (Signed)
   Covid-19 Vaccination Clinic  Name:  Colin Barron    MRN: [id number] DOB: 18-Mar-1946  07/04/2019  Mr. Colin Barron was observed post Covid-19 immunization for 15 minutes without incident. He was provided with Vaccine Information Sheet and instruction to access the V-Safe system.   Mr. Colin Barron was instructed to call 911 with any severe reactions post vaccine: Marland Kitchen Difficulty breathing  . Swelling of face and throat  . A fast heartbeat  . A bad rash all over body  . Dizziness and weakness   Immunizations Administered    Name Date Dose VIS Date Route   Moderna COVID-19 Vaccine 07/04/2019  9:39 AM 0.5 mL 03/27/2019 Intramuscular   Manufacturer: Moderna   Lot: [id number]   NDC: 64383-779-39

## 2019-12-20 NOTE — Progress Notes (Signed)
Name: Colin CuretLeon H Barron   MRN: [id number]    DOB: 10/08/1945   Date:12/21/2019       Progress Note  Subjective  Chief Complaint  Chief Complaint  Patient presents with  . Annual Exam    HPI  Patient is a 74 year old male Last annual exam was in August 2020 Presents today for his annual exam. 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 His gland size is estimated at 300 g. He is s/p Robotic Simple Prostatectomy on 01/09/19 with Dr. Celso Sickleaynor.  Did well post surgery, some leakage before voids.  Also with ED.  His Flomax and finasteride were stopped, with the recent addition of mirabegron and changed to Vesicare due to cost.  Also was prescribed Viagra for ED, never took it.  Also last saw ophthalmology 05/08/2019 with dry eyes and age-related cataracts noted and artificial tears prescribed, not using every day. Right eye was red when awoke last Sunday, looked like a blood vessel broke as he described, better after applied eye drops had at home (sulfacetamide) that he had from previous.  He has taken those drops for 5 days now.  Also notes some intermittent left shoulder/neck sx's, intermitttent discomfort, no pains down arm and no numbness and tingling down arm.  Noted does often feel better when gets out of the warm shower, although then can get more stiff her again. Notes thinks facial muslces on left side "less dense", denies any facial droop, any facial muscle weakness, just thinks that cheek is less prominent on the left than the right.  He thinks it has to do with the soft tissue. Had neck surgery 40 years ago for lipoma removal  In general, has been feeling well. He had a [address] [place] hat on today.  USPSTF grade A and B recommendations:  Diet:   notes tries to watch and eat healthy, appetite is good Exercise:   notes no  regular exercise, stays very active with activities throughout the day.  Depression: phq 9 is negative Depression screen [facility] 12/21/2019 03/16/2019 02/20/2019 12/18/2018 11/20/2018  Decreased Interest 0 0 0 0 0  Down, Depressed, Hopeless - 0 0 0 0  PHQ - 2 Score 0 0 0 0 0  Altered sleeping - 0 - 0 0  Tired, decreased energy - 0 - 0 0  Change in appetite - 0 - 0 0  Feeling bad or failure about yourself  - 0 - 0 0  Trouble concentrating - 0 - 0 0  Moving slowly or fidgety/restless - 0 - 0 0  Suicidal thoughts - 0 - 0 0  PHQ-9 Score - 0 - 0 0  Difficult doing work/chores - Not difficult at all - Not difficult at all Not difficult at all    Hypertension:  BP Readings from Last 3 Encounters:  12/21/19 138/84  03/16/19 122/84  02/20/19 118/78    Obesity: Weight remains stable Wt Readings from Last 3 Encounters:  12/21/19 188 lb 14.4 oz (85.7 kg)  03/16/19 186 lb 14.4 oz (84.8 kg)  02/20/19 185 lb 11.2 oz (84.2 kg)   BMI Readings from Last 3 Encounters:  12/21/19 26.35 kg/m  03/16/19 26.07 kg/m  02/20/19 25.90 kg/m     Lipids:  Lab Results  Component Value Date   CHOL 156 12/18/2018   Lab Results  Component Value Date  HDL 50 12/18/2018   Lab Results  Component Value Date   LDLCALC 88 12/18/2018   Lab Results  Component Value Date   TRIG 85 12/18/2018   Lab Results  Component Value Date   CHOLHDL 3.1 12/18/2018   No results found for: LDLDIRECT  Glucose:  Glucose, Bld  Date Value Ref Range Status  12/18/2018 102 (H) 65 - 99 mg/dL Final    Comment:    .            Fasting reference interval . For someone without known diabetes, a glucose value between 100 and 125 mg/dL is consistent with prediabetes and should be confirmed with a follow-up test. .   10/03/2018 112 (H) 70 - 99 mg/dL Final      [facility] Visit from 03/16/2019 [facility]  AUDIT-C Score 0     Divorced STD testing and prevention (HIV/chl/gon/syphilis):  denies  any new partners and not sexually active.  Not felt any need for testing for STDs. Hep C: negative prior check  Skin cancer:  No lesions of concern noted by patient in recent past, has a lot of skin lesions on his back, chest, and does not take any have had concerning changes recently Colorectal cancer:  Completed 10/04/2018, repeat every 10 years noted  prostate cancer: seeing urologist, s/p prostatectomy, notes last PSA was very low, continues to be followed by urology every 6 months.  Lung cancer:  N/A - Low Dose CT Chest recommended if Age 23-80 years, 30 pack-year currently smoking OR have quit w/in 15years. Patient does not qualify.   AAA:  N/A - The USPSTF recommends one-time screening with ultrasonography in men ages 72 to 37 years who have ever smoked. Does not qualify   Advanced Care Planning: A voluntary discussion about advance care planning including the explanation and discussion of advance directives.  Discussed health care proxy and Living will, and the patient was able to identify a health care proxy as brother, Colin Barron .  Patient does not have a living will at present time. If patient does have living will, I have requested they bring this to the clinic to be scanned in to their chart.   Tobacco-never smoker   Alcohol-denies use  Patient Active Problem List   Diagnosis Date Noted  . Aortic atherosclerosis (HCC) 11/20/2018  . 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 BPH (benign prostatic hyperplasia) 12/08/2016    Past Surgical History:  Procedure Laterality Date  . BIOPSY PROSTATE    . CHOLECYSTECTOMY    . CYST REMOVAL NECK    . TONSILLECTOMY      Family History  Problem Relation Age of Onset  . Diabetes Mother   . Hypertension Mother   . Intracerebral hemorrhage Brother   . Hypertension Brother   . Stroke Maternal Grandmother   . Stroke Maternal Grandfather   . Hypertension Brother        POSS    Social History   Socioeconomic  History  . Marital status: Divorced    Spouse name: Not on file  . Number of children: 3  . Years of education: 17  . Highest education level: Bachelor's degree (e.g., BA, AB, BS)  Occupational History  . Not on file  Tobacco Use  . Smoking status: Never Smoker  . Smokeless tobacco: Never Used  Vaping Use  . Vaping Use: Never used  Substance and Sexual Activity  . Alcohol use: No  . Drug use: No  .  Sexual activity: Yes  Other Topics Concern  . Not on file  Social History Narrative   Pt lives with his brother   Social Determinants of Health   Financial Resource Strain:   . Difficulty of Paying Living Expenses: Not on file  Food Insecurity:   . Worried About Programme researcher, broadcasting/film/video in the Last Year: Not on file  . Ran Out of Food in the Last Year: Not on file  Transportation Needs:   . Lack of Transportation (Medical): Not on file  . Lack of Transportation (Non-Medical): Not on file  Physical Activity: Sufficiently Active  . Days of Exercise per Week: 4 days  . Minutes of Exercise per Session: 40 min  Stress:   . Feeling of Stress : Not on file  Social Connections:   . Frequency of Communication with Friends and Family: Not on file  . Frequency of Social Gatherings with Friends and Family: Not on file  . Attends Religious Services: Not on file  . Active Member of Clubs or Organizations: Not on file  . Attends Banker Meetings: Not on file  . Marital Status: Not on file  Intimate Partner Violence:   . Fear of Current or Ex-Partner: Not on file  . Emotionally Abused: Not on file  . Physically Abused: Not on file  . Sexually Abused: Not on file    No current outpatient medications on file.  Allergies  Allergen Reactions  . Amoxicillin Other (See Comments)    Did it involve swelling of the face/tongue/throat, SOB, or low BP? No Did it involve sudden or severe rash/hives, skin peeling, or any reaction on the inside of your mouth or nose? No Did you need to  seek medical attention at a hospital or doctor's office? No When did it last happen?Over 10 years ago If all above answers are "NO", may proceed with cephalosporin use.  Brown Spots     ROS: As noted above in HPI denies any recent unintentional weight loss, increased fatigue No recent fevers or other Covid concerning sx's, did have the vaccine No increase headaches, vision changes No CP, palpitations,  No increased SOB,   No persistent abdominal pains or change in bowel habits,  No rectal bleeding or dark/black stools,  No flank pains or dysuria,  Was up 1 time overnight last night to urinate, and denies increased frequency No lower extremity swelling,  Positive for question some swelling intermittently in the left scrotal region, noted was given a cream to apply and thought the swelling increased some, so stopped applying.  Thinks it is lessened some again. No increase joint aches, did note the increased discomfort in the left neck upper shoulder area No numbness, tingling or weakness in the extremities Denies other specific complaints on systems review except as noted in HPI    Objective  Vitals:   12/21/19 1334  BP: 138/84  Pulse: 85  Resp: 16  Temp: 98.1 F (36.7 C)  TempSrc: Oral  SpO2: 99%  Weight: 188 lb 14.4 oz (85.7 kg)  Height:  (1.803 m)    Body mass index is 26.35 kg/m.  NAD, masked, pleasant HEENT - sclera anicteric, PERRL, EOMI, conj - non-inj'ed on the left, a small subconjunctiva hemorrhage was noted in the right eye inferior to the iris, no discharge or tearing from the eye, no sinus tenderness, TM's and canals clear, pharynx clear Neck - supple, no adenopathy, no TM, no JVD, carotids 2+ and = without bruits bilat, nontender  with palpation over the cervical spine, not markedly tender left of the cervical spine towards the trap muscle complex where he feels his intermittent discomfort, adequate range of motion of the neck today Car - RRR without  m/g/r Pulm- CTA without wheeze or rales Abd - soft, NT, ND, BS+, no obvious HSM, no masses Back - no CVA tenderness, no spinal tenderness, no infrascapular discomfort on palpation on the left, not markedly tender palpating over the trap muscle in the upper back where he sometimes feels discomfort intermittently. Skin-a very large number of pigmented lesions noted on his back and slightly less on the upper chest abdomen area, the vast majority looking like benign seborrheic keratotic lesions (like stuck on).  Difficult to know changes as this is my first visit with him today.  He denied any concerns for changes recently, although would be hard for him to identify with the sheer number and also the vast majority on his back. Ext - no LE edema, no active joints GU - no swelling in inguinal/suprapubic region, NT   Left testes firm and prominent, larger than the right, not tender to palpate,, no inguinal adenopathy Rectal:  Deferred is followed by urology presently Neuro - affect was not flat, appropriate with conversation  CN's II-XII intact  Grossly non-focal with good strength on testing, sensation intact to LT in distal extremities, DTR's 2+ and = patella, Romberg neg, no pronator drift, good balance on one foot, good finger to nose    No results found for this or any previous visit (from the past 2160 hour(s)).   PHQ2/9: Depression screen [facility] 2/9 12/21/2019 03/16/2019 02/20/2019 12/18/2018 11/20/2018  Decreased Interest 0 0 0 0 0  Down, Depressed, Hopeless - 0 0 0 0  PHQ - 2 Score 0 0 0 0 0  Altered sleeping - 0 - 0 0  Tired, decreased energy - 0 - 0 0  Change in appetite - 0 - 0 0  Feeling bad or failure about yourself  - 0 - 0 0  Trouble concentrating - 0 - 0 0  Moving slowly or fidgety/restless - 0 - 0 0  Suicidal thoughts - 0 - 0 0  PHQ-9 Score - 0 - 0 0  Difficult doing work/chores - Not difficult at all - Not difficult at all Not difficult at all    Fall Risk: Fall Risk   12/21/2019 03/16/2019 02/20/2019 12/18/2018 11/20/2018  Falls in the past year? 0 0 0 0 0  Number falls in past yr: 0 0 0 0 0  Injury with Fall? 0 0 0 0 0  Follow up Falls evaluation completed Falls evaluation completed Falls prevention discussed - -     Functional Status Survey: Is the patient deaf or have difficulty hearing?: Yes Does the patient have difficulty seeing, even when wearing glasses/contacts?: Yes Does the patient have difficulty concentrating, remembering, or making decisions?: No Does the patient have difficulty walking or climbing stairs?: No Does the patient have difficulty dressing or bathing?: No Does the patient have difficulty doing errands alone such as visiting a doctor's office or shopping?: No    Assessment & Plan  General Health/Health Maintenance:   -Prostate cancer screening and PSA options (with potential risks and benefits of testing vs not testing) were discussed along with recent recs/guidelines.  Continues to follow with urology to help in this regard.  -USPSTF grade A and B recommendations reviewed with patient; age-appropriate recommendations, preventive care, screening tests, etc discussed and encouraged; healthy living  encouraged; see AVS for any further patient education given to patient  -Discussed importance of regular physical activity weekly, including range of motion exercises for his neck and shoulders  -Discussed importance of eating healthy diet and healthy weight maintenance.  Has been doing a pretty good job with this in the recent past.  -Reviewed Health Maintenance:  Adult vaccines due  Topic Date Due  . TETANUS/TDAP  04/26/2026   1. Benign prostatic hyperplasia with urinary retention Continue follow-up with urology  - COMPLETE METABOLIC PANEL WITH GFR - CBC with Differential/Platelet  2. Erectile dysfunction, unspecified erectile dysfunction type He notes he does not take the Viagra, and has not been an issue in the recent  past.  3. Screening for lipid disorders Last lipid panel reviewed, and he wanted to check this again this year. Ordered today. - Lipid panel  4. Annual physical exam  - COMPLETE METABOLIC PANEL WITH GFR - CBC with Differential/Platelet  5. Hyperglycemia Blood sugar was just borderline high last year, and will recheck a blood sugar in the comp panel again this year. - COMPLETE METABOLIC PANEL WITH GFR  6. Subconjunctival hemorrhage of right eye Educated on this diagnosis,  Did note it will take a while for this to slowly resolve. Can stop the antibiotic drops, as I do not think that is helpful presently. If it does not continue to slowly improve or if more concerning symptoms arising, does need to see his eye doctor again  7. Seborrheic keratoses Discussed the difficulty following the skin lesions, with so many noted on exam today. He denied any that had recent changes although extremely difficult for him to monitor. Also, difficult as a clinician to follow, with none particularly very concerning in appearance today. He did not seem too concerned with this, although may want to have dermatology's involvement over time, especially if any do raise concern for changes.  8. Neck stiffness 9. Trapezius muscle spasm Do feel his symptoms may have some arthritic contribution, although do feel there is definitely a muscle component. Recommended the importance of staying active, and recommended warm warmth to the neck muscle area and trap muscle region on the left, followed by some range of motion exercises, and can and with cold to help prevent more inflammation from arising. Also can use some Tylenol products as needed for discomfort   10. Scrotal swelling By history, he noted some of the intermittent swelling in this region, although seems to be a little better in the recent past.  On exam, did have more prominence of that left testicle compared to the right, and recommended he follow-up  again with the urologist to help assess, and did discuss if there are some concerns, an ultrasound may be a good next step.  Await urology's input presently  Await lab results presently, and continued input from his eye doctor as well as the urologist noted to be important today. Follows up at least yearly for his annual physicals, and should follow-up sooner as needed.

## 2019-12-21 ENCOUNTER — Encounter: Payer: Self-pay | Admitting: Internal Medicine

## 2019-12-21 ENCOUNTER — Other Ambulatory Visit: Payer: Self-pay

## 2019-12-21 ENCOUNTER — Ambulatory Visit (INDEPENDENT_AMBULATORY_CARE_PROVIDER_SITE_OTHER): Payer: Medicare PPO | Admitting: Internal Medicine

## 2019-12-21 VITALS — BP 138/84 | HR 85 | Temp 98.1°F | Resp 16 | Ht 71.0 in | Wt 188.9 lb

## 2019-12-21 DIAGNOSIS — N401 Enlarged prostate with lower urinary tract symptoms: Secondary | ICD-10-CM

## 2019-12-21 DIAGNOSIS — M436 Torticollis: Secondary | ICD-10-CM

## 2019-12-21 DIAGNOSIS — N529 Male erectile dysfunction, unspecified: Secondary | ICD-10-CM

## 2019-12-21 DIAGNOSIS — Z13220 Encounter for screening for lipoid disorders: Secondary | ICD-10-CM | POA: Diagnosis not present

## 2019-12-21 DIAGNOSIS — M62838 Other muscle spasm: Secondary | ICD-10-CM

## 2019-12-21 DIAGNOSIS — Z0000 Encounter for general adult medical examination without abnormal findings: Secondary | ICD-10-CM

## 2019-12-21 DIAGNOSIS — R739 Hyperglycemia, unspecified: Secondary | ICD-10-CM

## 2019-12-21 DIAGNOSIS — N5089 Other specified disorders of the male genital organs: Secondary | ICD-10-CM

## 2019-12-21 DIAGNOSIS — R338 Other retention of urine: Secondary | ICD-10-CM

## 2019-12-21 DIAGNOSIS — L821 Other seborrheic keratosis: Secondary | ICD-10-CM | POA: Insufficient documentation

## 2019-12-21 DIAGNOSIS — H1131 Conjunctival hemorrhage, right eye: Secondary | ICD-10-CM | POA: Insufficient documentation

## 2019-12-22 LAB — LIPID PANEL
Cholesterol: 161 mg/dL (ref <200)
HDL: 57 mg/dL (ref >=40)
LDL Cholesterol (Calc): 89 mg/dL (calc)
Non-HDL Cholesterol (Calc): 104 mg/dL (calc) (ref <130)
Total CHOL/HDL Ratio: 2.8 (calc) (ref <5.0)
Triglycerides: 64 mg/dL (ref <150)

## 2019-12-22 LAB — CBC WITH DIFFERENTIAL/PLATELET
Absolute Monocytes: 573 cells/uL (ref 200–950)
Basophils Absolute: 18 cells/uL (ref 0–200)
Basophils Relative: 0.3 %
Eosinophils Absolute: 122 cells/uL (ref 15–500)
Eosinophils Relative: 2 %
HCT: 44.2 % (ref 38.5–50.0)
Hemoglobin: 14.8 g/dL (ref 13.2–17.1)
Lymphs Abs: 1543 cells/uL (ref 850–3900)
MCH: 30.8 pg (ref 27.0–33.0)
MCHC: 33.5 g/dL (ref 32.0–36.0)
MCV: 92.1 fL (ref 80.0–100.0)
MPV: 11.6 fL (ref 7.5–12.5)
Monocytes Relative: 9.4 %
Neutro Abs: 3843 cells/uL (ref 1500–7800)
Neutrophils Relative %: 63 %
Platelets: 177 10*3/uL (ref 140–400)
RBC: 4.8 10*6/uL (ref 4.20–5.80)
RDW: 12.3 % (ref 11.0–15.0)
Total Lymphocyte: 25.3 %
WBC: 6.1 10*3/uL (ref 3.8–10.8)

## 2019-12-22 LAB — COMPLETE METABOLIC PANEL WITH GFR
AG Ratio: 1.5 (calc) (ref 1.0–2.5)
ALT: 13 U/L (ref 9–46)
AST: 17 U/L (ref 10–35)
Albumin: 4.5 g/dL (ref 3.6–5.1)
Alkaline phosphatase (APISO): 75 U/L (ref 35–144)
BUN: 7 mg/dL (ref 7–25)
CO2: 27 mmol/L (ref 20–32)
Calcium: 9.5 mg/dL (ref 8.6–10.3)
Chloride: 103 mmol/L (ref 98–110)
Creat: 0.83 mg/dL (ref 0.70–1.18)
GFR, Est African American: 100 mL/min/{1.73_m2} (ref >=60)
GFR, Est Non African American: 87 mL/min/{1.73_m2} (ref >=60)
Globulin: 3.1 g/dL (calc) (ref 1.9–3.7)
Glucose, Bld: 89 mg/dL (ref 65–99)
Potassium: 4.1 mmol/L (ref 3.5–5.3)
Sodium: 137 mmol/L (ref 135–146)
Total Bilirubin: 1.5 mg/dL — ABNORMAL HIGH (ref 0.2–1.2)
Total Protein: 7.6 g/dL (ref 6.1–8.1)

## 2020-02-26 ENCOUNTER — Ambulatory Visit (INDEPENDENT_AMBULATORY_CARE_PROVIDER_SITE_OTHER): Payer: Medicare PPO

## 2020-02-26 DIAGNOSIS — Z0000 Encounter for general adult medical examination without abnormal findings: Secondary | ICD-10-CM | POA: Diagnosis not present

## 2020-02-26 NOTE — Patient Instructions (Signed)
Mr. Colin Barron , Thank you for taking time to come for your Medicare Wellness Visit. I appreciate your ongoing commitment to your health goals. Please review the following plan we discussed and let me know if I can assist you in the future.   Screening recommendations/referrals: Colonoscopy: done 10/03/18 Recommended yearly ophthalmology/optometry visit for glaucoma screening and checkup Recommended yearly dental visit for hygiene and checkup  Vaccinations: Influenza vaccine: declined Pneumococcal vaccine: declined Tdap vaccine: due Shingles vaccine: Shingrix discussed. Please contact your pharmacy for coverage information.  Covid-19: done 06/04/19 & 07/04/19  Advanced directives: Advance directive discussed with you today. Even though you declined this today please call our office should you change your mind and we can give you the proper paperwork for you to fill out.  Conditions/risks identified: Keep up the great work!  Next appointment: Follow up in one year for your annual wellness visit.   Preventive Care 75 Years and Older, Male Preventive care refers to lifestyle choices and visits with your health care provider that can promote health and wellness. What does preventive care include?  A yearly physical exam. This is also called an annual well check.  /Dental exams once or twice a year./  Routine eye exams. Ask your health care provider how often you should have your eyes checked.  Personal lifestyle choices, including:  Daily care of your teeth and gums.  Regular physical activity.  Eating a healthy diet.  Avoiding tobacco and drug use.  Limiting alcohol use.  Practicing safe sex.  Taking low doses of aspirin every day.  Taking vitamin and mineral supplements as recommended by your health care provider. What happens during an annual well check? The services and screenings done by your health care provider during your annual well check will depend on your age,  overall health, lifestyle risk factors, and family history of disease. Counseling  Your health care provider may ask you questions about your:  Alcohol use.  Tobacco use.  Drug use.  Emotional well-being.  Home and relationship well-being.  Sexual activity.  Eating habits.  History of falls.  Memory and ability to understand (cognition).  Work and work Astronomer. Screening  You may have the following tests or measurements:  Height, weight, and BMI.  Blood pressure.  Lipid and cholesterol levels. These may be checked every 5 years, or more frequently if you are over 39 years old.  Skin check.  Lung cancer screening. You may have this screening every year starting at age 75 if you have a 30-pack-year history of smoking and currently smoke or have quit within the past 15 years.  Fecal occult blood test (FOBT) of the stool. You may have this test every year starting at age 75.  Flexible sigmoidoscopy or colonoscopy. You may have a sigmoidoscopy every 5 years or a colonoscopy every 10 years starting at age 75.  Prostate cancer screening. Recommendations will vary depending on your family history and other risks.  Hepatitis C blood test.  Hepatitis B blood test.  Sexually transmitted disease (STD) testing.  Diabetes screening. This is done by checking your blood sugar (glucose) after you have not eaten for a while (fasting). You may have this done every 1-3 years.  Abdominal aortic aneurysm (AAA) screening. You may need this if you are a current or former smoker.  Osteoporosis. You may be screened starting at age 75 if you are at high risk. Talk with your health care provider about your test results, treatment options, and if necessary, the  need for more tests. Vaccines  Your health care provider may recommend certain vaccines, such as:  Influenza vaccine. This is recommended every year.  Tetanus, diphtheria, and acellular pertussis (Tdap, Td) vaccine. You may  need a Td booster every 10 years.  Zoster vaccine. You may need this after age 75.  Pneumococcal 13-valent conjugate (PCV13) vaccine. One dose is recommended after age 22.  Pneumococcal polysaccharide (PPSV23) vaccine. One dose is recommended after age 24. Talk to your health care provider about which screenings and vaccines you need and how often you need them. This information is not intended to replace advice given to you by your health care provider. Make sure you discuss any questions you have with your health care provider. Document Released: 05/09/2015 Document Revised: 12/31/2015 Document Reviewed: 02/11/2015 Elsevier Interactive Patient Education  2017 [place] Prevention in the Home Falls can cause injuries. They can happen to people of all ages. There are many things you can do to make your home safe and to help prevent falls. What can I do on the outside of my home?  Regularly fix the edges of walkways and driveways and fix any cracks.  Remove anything that might make you trip as you walk through a door, such as a raised step or threshold.  Trim any bushes or trees on the path to your home.  Use bright outdoor lighting.  Clear any walking paths of anything that might make someone trip, such as rocks or tools.  Regularly check to see if handrails are loose or broken. Make sure that both sides of any steps have handrails.  Any raised decks and porches should have guardrails on the edges.  Have any leaves, snow, or ice cleared regularly.  Use sand or salt on walking paths during winter.  Clean up any spills in your garage right away. This includes oil or grease spills. What can I do in the bathroom?  Use night lights.  Install grab bars by the toilet and in the tub and shower. Do not use towel bars as grab bars.  Use non-skid mats or decals in the tub or shower.  If you need to sit down in the shower, use a plastic, non-slip stool.  Keep the floor  dry. Clean up any water that spills on the floor as soon as it happens.  Remove soap buildup in the tub or shower regularly.  Attach bath mats securely with double-sided non-slip rug tape.  Do not have throw rugs and other things on the floor that can make you trip. What can I do in the bedroom?  Use night lights.  Make sure that you have a light by your bed that is easy to reach.  Do not use any sheets or blankets that are too big for your bed. They should not hang down onto the floor.  Have a firm chair that has side arms. You can use this for support while you get dressed.  Do not have throw rugs and other things on the floor that can make you trip. What can I do in the kitchen?  Clean up any spills right away.  Avoid walking on wet floors.  Keep items that you use a lot in easy-to-reach places.  If you need to reach something above you, use a strong step stool that has a grab bar.  Keep electrical cords out of the way.  Do not use floor polish or wax that makes floors slippery. If you must use wax,  use non-skid floor wax.  Do not have throw rugs and other things on the floor that can make you trip. What can I do with my stairs?  Do not leave any items on the stairs.  Make sure that there are handrails on both sides of the stairs and use them. Fix handrails that are broken or loose. Make sure that handrails are as long as the stairways.  Check any carpeting to make sure that it is firmly attached to the stairs. Fix any carpet that is loose or worn.  Avoid having throw rugs at the top or bottom of the stairs. If you do have throw rugs, attach them to the floor with carpet tape.  Make sure that you have a light switch at the top of the stairs and the bottom of the stairs. If you do not have them, ask someone to add them for you. What else can I do to help prevent falls?  Wear shoes that:  Do not have high heels.  Have rubber bottoms.  Are comfortable and fit you  well.  Are closed at the toe. Do not wear sandals.  If you use a stepladder:  Make sure that it is fully opened. Do not climb a closed stepladder.  Make sure that both sides of the stepladder are locked into place.  Ask someone to hold it for you, if possible.  Clearly mark and make sure that you can see:  Any grab bars or handrails.  First and last steps.  Where the edge of each step is.  Use tools that help you move around (mobility aids) if they are needed. These include:  Canes.  Walkers.  Scooters.  Crutches.  Turn on the lights when you go into a dark area. Replace any light bulbs as soon as they burn out.  Set up your furniture so you have a clear path. Avoid moving your furniture around.  If any of your floors are uneven, fix them.  If there are any pets around you, be aware of where they are.  Review your medicines with your doctor. Some medicines can make you feel dizzy. This can increase your chance of falling. Ask your doctor what other things that you can do to help prevent falls. This information is not intended to replace advice given to you by your health care provider. Make sure you discuss any questions you have with your health care provider. Document Released: 02/06/2009 Document Revised: 09/18/2015 Document Reviewed: 05/17/2014 Elsevier Interactive Patient Education  2017 [employer].

## 2020-02-26 NOTE — Progress Notes (Signed)
Subjective:   Colin Barron is a 74 y.o. male who presents for Medicare Annual/Subsequent preventive examination.  Virtual Visit via Telephone Note  I connected with  Deylan Canterbury [place] on 02/26/20 at 10:40 AM EDT by telephone and verified that I am speaking with the correct person using two identifiers.  Medicare Annual Wellness visit completed telephonically due to Covid-19 pandemic.   Location: Patient: home Provider: [facility]   I discussed the limitations, risks, security and privacy concerns of performing an evaluation and management service by telephone and the availability of in person appointments. The patient expressed understanding and agreed to proceed.  Unable to perform video visit due to video visit attempted and failed and/or patient does not have video capability.   Some vital signs may be absent or patient reported.   Reather Littler, LPN   Review of Systems     Cardiac Risk Factors include: advanced age (>25men, >43 women);male gender     Objective:    There were no vitals filed for this visit. There is no height or weight on file to calculate BMI.  Advanced Directives 02/26/2020 02/20/2019 10/27/2018 10/03/2018 12/08/2016  Does Patient Have a Medical Advance Directive? No No No No No  Would patient like information on creating a medical advance directive? No - Patient declined No - Patient declined No - Patient declined - -    Current Medications (verified) Outpatient Encounter Medications as of 02/26/2020  Medication Sig  . [DISCONTINUED] SULFACETAMIDE SODIUM EX Apply topically. Eye drops daily   No facility-administered encounter medications on file as of 02/26/2020.    Allergies (verified) Amoxicillin   History: Past Medical History:  Diagnosis Date  . BPH (benign prostatic hyperplasia) 12/08/2016   Past Surgical History:  Procedure Laterality Date  . BIOPSY PROSTATE    . CHOLECYSTECTOMY    . CYST REMOVAL NECK    . TONSILLECTOMY     Family  History  Problem Relation Age of Onset  . Diabetes Mother   . Hypertension Mother   . Intracerebral hemorrhage Brother   . Hypertension Brother   . Stroke Maternal Grandmother   . Stroke Maternal Grandfather   . Hypertension Brother        POSS   Social History   Socioeconomic History  . Marital status: Divorced    Spouse name: Not on file  . Number of children: 3  . Years of education: 15  . Highest education level: Bachelor's degree (e.g., BA, AB, BS)  Occupational History  . Not on file  Tobacco Use  . Smoking status: Never Smoker  . Smokeless tobacco: Never Used  Vaping Use  . Vaping Use: Never used  Substance and Sexual Activity  . Alcohol use: No  . Drug use: No  . Sexual activity: Yes  Other Topics Concern  . Not on file  Social History Narrative   Pt lives with his brother   Social Determinants of Health   Financial Resource Strain: Low Risk   . Difficulty of Paying Living Expenses: Not hard at all  Food Insecurity: No Food Insecurity  . Worried About Programme researcher, broadcasting/film/video in the Last Year: Never true  . Ran Out of Food in the Last Year: Never true  Transportation Needs: No Transportation Needs  . Lack of Transportation (Medical): No  . Lack of Transportation (Non-Medical): No  Physical Activity: Insufficiently Active  . Days of Exercise per Week: 4 days  . Minutes of Exercise per Session: 30 min  Stress:  No Stress Concern Present  . Feeling of Stress : Not at all  Social Connections: Socially Isolated  . Frequency of Communication with Friends and Family: More than three times a week  . Frequency of Social Gatherings with Friends and Family: More than three times a week  . Attends Religious Services: Never  . Active Member of Clubs or Organizations: No  . Attends Banker Meetings: Never  . Marital Status: Divorced    Tobacco Counseling Counseling given: Not Answered   Clinical Intake:  Pre-visit preparation completed: Yes  Pain :  No/denies pain     Nutritional Risks: None Diabetes: No  How often do you need to have someone help you when you read instructions, pamphlets, or other written materials from your doctor or pharmacy?: 1 - Never    Interpreter Needed?: No  Information entered by :: Reather Littler LPN   Activities of Daily Living In your present state of health, do you have any difficulty performing the following activities: 02/26/2020 12/21/2019  Hearing? N Y  Comment declines hearing aids -  Vision? N Y  Difficulty concentrating or making decisions? N N  Walking or climbing stairs? N N  Dressing or bathing? N N  Doing errands, shopping? N N  Preparing Food and eating ? N -  Using the Toilet? N -  In the past six months, have you accidently leaked urine? Y -  Comment wears guard for protection -  Do you have problems with loss of bowel control? N -  Managing your Medications? N -  Managing your Finances? N -  Housekeeping or managing your Housekeeping? N -  Some recent data might be hidden    Patient Care Team: Jamelle Haring, MD as PCP - General (Internal Medicine)  Indicate any recent Medical Services you may have received from other than [facility] providers in the past year (date may be approximate).     Assessment:   This is a routine wellness examination for Kashtyn.  Hearing/Vision screen  Hearing Screening   125Hz  250Hz  500Hz  1000Hz  2000Hz  3000Hz  4000Hz  6000Hz  8000Hz   Right ear:           Left ear:           Comments: Pt denies hearing difficulty  Vision Screening Comments:  [facility]ual vision screenings done at [facility]  Dietary issues and exercise activities discussed: Current Exercise Habits: Home exercise routine, Type of exercise: Other - see comments (yard work), Time (Minutes): 30, Frequency (Times/Week): 4, Weekly Exercise (Minutes/Week): 120, Intensity: Moderate, Exercise limited by: None identified  Goals    .  DIET - EAT MORE FRUITS AND VEGETABLES (pt-stated)       Depression Screen PHQ 2/9 Scores 02/26/2020 12/21/2019 03/16/2019 02/20/2019 12/18/2018 11/20/2018 12/05/2017  PHQ - 2 Score 0 0 0 0 0 0 0  PHQ- 9 Score - - 0 - 0 0 -    Fall Risk Fall Risk  02/26/2020 12/21/2019 03/16/2019 02/20/2019 12/18/2018  Falls in the past year? 0 0 0 0 0  Number falls in past yr: 0 0 0 0 0  Injury with Fall? 0 0 0 0 0  Risk for fall due to : No Fall Risks - - - -  Follow up Falls prevention discussed Falls evaluation completed Falls evaluation completed Falls prevention discussed -    Any stairs in or around the home? Yes  If so, are there any without handrails? No  Home free of loose throw rugs in  walkways, pet beds, electrical cords, etc? Yes  Adequate lighting in your home to reduce risk of falls? Yes   ASSISTIVE DEVICES UTILIZED TO PREVENT FALLS:  Life alert? No  Use of a cane, walker or w/c? No  Grab bars in the bathroom? No  Shower chair or bench in shower? No  Elevated toilet seat or a handicapped toilet? Yes   TIMED UP AND GO:  Was the test performed? No . Telephonic visit.   Cognitive Function: 6CIT deferred for 2021 AWV; pt states no memory issues     6CIT Screen 02/20/2019  What Year? 0 points  What month? 0 points  What time? 0 points  Count back from 20 0 points  Months in reverse 0 points  Repeat phrase 0 points  Total Score 0    Immunizations Immunization History  Administered Date(s) Administered  . Influenza, High Dose Seasonal PF 01/28/2017  . Moderna SARS-COVID-2 Vaccination 06/04/2019, 07/04/2019    TDAP status: Due, Education has been provided regarding the importance of this vaccine. Advised may receive this vaccine at local pharmacy or Health Dept. Aware to provide a copy of the vaccination record if obtained from local pharmacy or Health Dept. Verbalized acceptance and understanding.   Flu Vaccine status: Declined, Education has been provided regarding the importance of this vaccine but patient still declined.  Advised may receive this vaccine at local pharmacy or Health Dept. Aware to provide a copy of the vaccination record if obtained from local pharmacy or Health Dept. Verbalized acceptance and understanding.   Pneumococcal vaccine status: Declined,  Education has been provided regarding the importance of this vaccine but patient still declined. Advised may receive this vaccine at local pharmacy or Health Dept. Aware to provide a copy of the vaccination record if obtained from local pharmacy or Health Dept. Verbalized acceptance and understanding.  Patient states he had a pneumonia vaccine a  Covid-19 vaccine status: Completed vaccines  Qualifies for Shingles Vaccine? Yes   Zostavax completed No   Shingrix Completed?: No.    Education has been provided regarding the importance of this vaccine. Patient has been advised to call insurance company to determine out of pocket expense if they have not yet received this vaccine. Advised may also receive vaccine at local pharmacy or Health Dept. Verbalized acceptance and understanding.  Screening Tests Health Maintenance  Topic Date Due  . INFLUENZA VACCINE  07/24/2020 (Originally 11/25/2019)  . PNA vac Low Risk Adult (1 of 2 - PCV13) 02/25/2021 (Originally 04/22/2010)  . TETANUS/TDAP  04/26/2026  . COLONOSCOPY  10/02/2028  . COVID-19 Vaccine  Completed  . Hepatitis C Screening  Completed    Health Maintenance  There are no preventive care reminders to display for this patient.  Colorectal cancer screening: Completed 10/03/18. Repeat every 10 years  Lung Cancer Screening: (Low Dose CT Chest recommended if Age 12-80 years, 30 pack-year currently smoking OR have quit w/in 15years.) does not qualify.   Additional Screening:  Hepatitis C Screening: does qualify; Completed 08/02/17  Vision Screening: Recommended annual ophthalmology exams for early detection of glaucoma and other disorders of the eye. Is the patient up to date with their annual eye exam?   Yes  Who is the provider or what is the name of the office in which the patient attends annual eye exams? Dr. Nile Riggs   Dental Screening: Recommended annual dental exams for proper oral hygiene  Community Resource Referral / Chronic Care Management: CRR required this visit?  No   CCM  required this visit?  No      Plan:     I have personally reviewed and noted the following in the patient's chart:   . Medical and social history . Use of alcohol, tobacco or illicit drugs  . Current medications and supplements . Functional ability and status . Nutritional status . Physical activity . Advanced directives . List of other physicians . Hospitalizations, surgeries, and ER visits in previous 12 months . Vitals . Screenings to include cognitive, depression, and falls . Referrals and appointments  In addition, I have reviewed and discussed with patient certain preventive protocols, quality metrics, and best practice recommendations. A written personalized care plan for preventive services as well as general preventive health recommendations were provided to patient.     Reather LittlerKasey Alyn Riedinger, LPN   16/1/096011/05/2019   Nurse Notes: none

## 2020-10-23 ENCOUNTER — Encounter: Payer: Self-pay | Admitting: Family Medicine

## 2020-10-23 ENCOUNTER — Ambulatory Visit: Payer: Medicare PPO | Admitting: Family Medicine

## 2020-10-23 ENCOUNTER — Ambulatory Visit (INDEPENDENT_AMBULATORY_CARE_PROVIDER_SITE_OTHER): Payer: Medicare PPO

## 2020-10-23 ENCOUNTER — Other Ambulatory Visit: Payer: Self-pay

## 2020-10-23 DIAGNOSIS — M542 Cervicalgia: Secondary | ICD-10-CM

## 2020-10-23 NOTE — Progress Notes (Signed)
Office Visit Note   Patient: Colin Barron           Date of Birth: 1945-08-01           MRN: [id number] Visit Date: 10/23/2020 Requested by: [facility] [address] [place],  [place] [address] PCP: [facility] [place], [place]  Subjective: Chief Complaint  Patient presents with   Neck - Pain    Intermittent pain in the left side of neck and into the shoulder x years. Has been worse for the past 2 years. NKI, but says he started walking tilted forward after he had prostate surgery -- neck pain started afterward. No radiating pain down the arms. No numbness/tingling in the hands/fingers. Occasional left-sided headache -- not bad enough to take any meds for it -- goes away on its own.    HPI: He is here with neck pain.  Symptoms started 2 to 3 years ago, no injury.  Pain and stiffness in his neck with very infrequent spasms in his right arm.  He does not take medication for his pain.  He feels like his posture has changed, and that he walks forward leaning.  He states that probably 20 years ago he had to stop writing with his right hand due to a tremor.  He is still able to eat with his right hand but now he writes with his left hand.  He is not sure whether these things are related.  He denies any weakness or numbness in his arms.               ROS:   All other systems were reviewed and are negative.  Objective: Vital Signs: There were no vitals taken for this visit.  Physical Exam:  General:  Alert and oriented, in no acute distress. Pulm:  Breathing unlabored. Psy:  Normal mood, congruent affect.  Neck: He has decreased rotation and sidebending range of motion bilaterally and symmetrically.  Spurling's test is negative.  He has some tender trigger points in the left cervical paraspinous muscles and trapezius.  Upper extremity strength and reflexes remain normal.  No tremor at rest or with movement today.    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   Assessment & Plan: Cervical spine spondylosis -Discussed options and elected to try physical therapy along with glucosamine and turmeric.  If symptoms worsen, MRI scan.     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   BPH (benign prostatic hyperplasia) 12/08/2016   Past Medical History:  Diagnosis Date   BPH (benign prostatic hyperplasia) 12/08/2016    Family History  Problem Relation Age of Onset   Diabetes Mother    Hypertension Mother    Intracerebral hemorrhage Brother    Hypertension Brother    Stroke Maternal Grandmother    Stroke Maternal Grandfather    Hypertension Brother        POSS    Past Surgical History:  Procedure Laterality Date   BIOPSY PROSTATE     CHOLECYSTECTOMY     CYST REMOVAL NECK     TONSILLECTOMY     Social History  Occupational History   Not on file  Tobacco Use   Smoking status: Never   Smokeless tobacco: Never  Vaping Use   Vaping Use: Never used  Substance and Sexual Activity   Alcohol use: No   Drug use: No   Sexual activity: Yes

## 2020-10-23 NOTE — Patient Instructions (Signed)
    Try these:  Glucosamine Sulfate 1,000 mg twice daily  Turmeric 500 mg twice daily

## 2020-11-04 ENCOUNTER — Telehealth: Payer: Self-pay | Admitting: Family Medicine

## 2020-11-04 NOTE — Telephone Encounter (Signed)
Pt calling asking about the P.T referral Dr. Prince Rome sent out for him. Pt stated at last appt with [facility] he spoke of sending him to another provider for P.T but did not know who it was. The best call back number is [phone].

## 2020-11-04 NOTE — Telephone Encounter (Signed)
I called the patient: he found the referral to [facility] PT. He will call and schedule an appointment.

## 2020-12-22 ENCOUNTER — Encounter: Payer: Medicare PPO | Admitting: Internal Medicine

## 2021-01-01 ENCOUNTER — Ambulatory Visit: Payer: Self-pay | Admitting: *Deleted

## 2021-01-01 NOTE — Telephone Encounter (Signed)
Reason for Disposition  [1] HIGH RISK for severe COVID complications (e.g., weak immune system, age > 64 years, obesity with BMI > 25, pregnant, chronic lung disease or other chronic medical condition) AND [2] COVID symptoms (e.g., cough, fever)  (Exceptions: Already seen by PCP and no new or worsening symptoms.)  Answer Assessment - Initial Assessment Questions 1. COVID-19 DIAGNOSIS: "Who made your COVID-19 diagnosis?" "Was it confirmed by a positive lab test or self-test?" If not diagnosed by a doctor (or NP/PA), ask "Are there lots of cases (community spread) where you live?" Note: See public health department website, if unsure.     + home test today 2. COVID-19 EXPOSURE: "Was there any known exposure to COVID before the symptoms began?" CDC Definition of close contact: within 6 feet (2 meters) for a total of 15 minutes or more over a 24-hour period.      No known exposure 3. ONSET: "When did the COVID-19 symptoms start?"      yesterday 4. WORST SYMPTOM: "What is your worst symptom?" (e.g., cough, fever, shortness of breath, muscle aches)     Discomfort on left side- arthritis 5. COUGH: "Do you have a cough?" If Yes, ask: "How bad is the cough?"       No cough 6. FEVER: "Do you have a fever?" If Yes, ask: "What is your temperature, how was it measured, and when did it start?"     no 7. RESPIRATORY STATUS: "Describe your breathing?" (e.g., shortness of breath, wheezing, unable to speak)      normal 8. BETTER-SAME-WORSE: "Are you getting better, staying the same or getting worse compared to yesterday?"  If getting worse, ask, "In what way?"     same 9. HIGH RISK DISEASE: "Do you have any chronic medical problems?" (e.g., asthma, heart or lung disease, weak immune system, obesity, etc.)     no 10. VACCINE: "Have you had the COVID-19 vaccine?" If Yes, ask: "Which one, how many shots, when did you get it?"       Yes- Moderna 11. BOOSTER: "Have you received your COVID-19 booster?" If Yes, ask:  "Which one and when did you get it?"       yes 12. PREGNANCY: "Is there any chance you are pregnant?" "When was your last menstrual period?"       N/a 13. OTHER SYMPTOMS: "Do you have any other symptoms?"  (e.g., chills, fatigue, headache, loss of smell or taste, muscle pain, sore throat)       no 14. O2 SATURATION MONITOR:  "Do you use an oxygen saturation monitor (pulse oximeter) at home?" If Yes, ask "What is your reading (oxygen level) today?" "What is your usual oxygen saturation reading?" (e.g., 95%)       no  Protocols used: Coronavirus (COVID-19) Diagnosed or Suspected-A-AH

## 2021-01-01 NOTE — Telephone Encounter (Signed)
Pt has an appt for 01/02/21 with Della Goo FNP

## 2021-01-01 NOTE — Telephone Encounter (Signed)
Patient is calling to report he has tested + COVID. Patient states he has sore joints where he has arthritis- but otherwise very mild symptoms. Symptoms started yesterday. Patient advised per COVID protocol for treatment/symptoms. Patient advised would send his message to provider for review and any other treatment options since he is in moderate category.

## 2021-01-02 ENCOUNTER — Telehealth (INDEPENDENT_AMBULATORY_CARE_PROVIDER_SITE_OTHER): Payer: Medicare PPO | Admitting: Nurse Practitioner

## 2021-01-02 DIAGNOSIS — U071 COVID-19: Secondary | ICD-10-CM

## 2021-01-02 MED ORDER — BENZONATATE 100 MG PO CAPS: 100.0000 mg | ORAL_CAPSULE | Freq: Two times a day (BID) | ORAL | 0 refills | Status: DC | PRN

## 2021-01-02 NOTE — Progress Notes (Signed)
Name: Colin Barron   MRN: [id number]    DOB: Jul 16, 1945   Date:01/02/2021       Progress Note  Subjective  Chief Complaint  Chief Complaint  Patient presents with   Covid Positive    9/7 Pt has a mild cough mainly in the mornings. Pt has been taking cough syrup. Headaches on Left side. Pt would like to know if he can take something OTC for cough.    I connected with  Navdeep Halt Rhoads  on 01/02/21 at  1:00 PM EDT by a telephone telemedicine application and verified that I am speaking with the correct person using two identifiers.  I discussed the limitations of evaluation and management by telemedicine and the availability of in person appointments. The patient expressed understanding and agreed to proceed with a virtual visit  Staff also discussed with the patient that there may be a patient responsible charge related to this service. Patient Location: home Provider Location: [facility] Additional Individuals present: alone  HPI  Covid -19: Symptoms started Wednesday 12/31/20.  He says he tested positive on Wednesday.  Symptoms include; fatigue, cough, left sided headache (pain 4/10) throbbing and nasal congestion.  He denies any fever, chest pain, or shortness of breath.  He is already treading symptoms with OTC.  He says he is drinking plenty of fluids.  He also says he is feeling a little bit better. Discussed Paxlovid, he is not interested.  He says he just wants something to help with his cough.  Discussed concerning signs and symptoms to go to the emergency department.  Also, discussed OTC treatments for symptoms.  Discussed CDC quarantine recommendations. He is agreeable to plan and will reach out if needed.   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   BPH (benign prostatic hyperplasia) 12/08/2016     Social History   Tobacco Use   Smoking status: Never   Smokeless tobacco: Never  Substance Use Topics   Alcohol use: No     Current Outpatient Medications:    benzonatate (TESSALON) 100 MG capsule, Take 1 capsule (100 mg total) by mouth 2 (two) times daily as needed for cough., Disp: 20 capsule, Rfl: 0   Cholecalciferol (VITAMIN D3 PO), Take by mouth daily., Disp: , Rfl:    Omega-3 Fatty Acids (FISH OIL PO), Take by mouth., Disp: , Rfl:    Polyethyl Glycol-Propyl Glycol (SYSTANE) 0.4-0.3 % SOLN, Apply to eye daily., Disp: , Rfl:   Allergies  Allergen Reactions   Amoxicillin Other (See Comments)    Did it involve swelling of the face/tongue/throat, SOB, or low BP? No Did it involve sudden or severe rash/hives, skin peeling, or any reaction on the inside of your mouth or nose? No Did you need to seek medical attention at a hospital or doctor's office? No When did it last happen?   Over 10 years ago If all above answers are "NO", may proceed with cephalosporin use.  Manson Passey Spots    I personally reviewed active problem list, medication list, allergies with the patient/caregiver today.  ROS  Constitutional: Negative for fever or weight change.  HEENT: positive for nasal congestion, negative for sore throat Respiratory: positive for cough, negative for shortness of breath.   Cardiovascular: Negative for chest pain or palpitations.  Gastrointestinal: Negative for abdominal pain, no bowel changes.  Musculoskeletal: Negative for gait problem or joint swelling.  Skin: Negative for rash.  Neurological: Negative for dizziness, positive for headache.  No other specific complaints in a complete review of systems (except as listed in HPI above).   Objective  Virtual encounter, vitals not obtained.  There is no height or weight on file to calculate BMI.  Nursing Note and Vital Signs reviewed.  Physical Exam  Awake, alert and oriented  No results found for this or any  previous visit (from the past 72 hour(s)).  Assessment & Plan  1. COVID-19 -discussed treating symptoms with OTC treatments - discussed concerning symptoms to go to er -discussed pushing fluids, and getting enough rest. - benzonatate (TESSALON) 100 MG capsule; Take 1 capsule (100 mg total) by mouth 2 (two) times daily as needed for cough.  Dispense: 20 capsule; Refill: 0   -Red flags and when to present for emergency care or RTC including fever >101.2F, chest pain, shortness of breath, new/worsening/un-resolving symptoms, reviewed with patient at time of visit. Follow up and care instructions discussed and provided in AVS. - I discussed the assessment and treatment plan with the patient. The patient was provided an opportunity to ask questions and all were answered. The patient agreed with the plan and demonstrated an understanding of the instructions.  I provided 15 minutes of non-face-to-face time during this encounter.  Berniece Salines, FNP

## 2021-02-06 ENCOUNTER — Encounter (HOSPITAL_BASED_OUTPATIENT_CLINIC_OR_DEPARTMENT_OTHER): Payer: Self-pay

## 2021-02-06 ENCOUNTER — Observation Stay (HOSPITAL_BASED_OUTPATIENT_CLINIC_OR_DEPARTMENT_OTHER)
Admission: EM | Admit: 2021-02-06 | Discharge: 2021-02-08 | Disposition: A | Discharge status: Home / Self Care | Payer: Medicare PPO | Attending: Internal Medicine | Admitting: Internal Medicine

## 2021-02-06 ENCOUNTER — Ambulatory Visit
Admission: EM | Admit: 2021-02-06 | Discharge: 2021-02-06 | Disposition: A | Discharge status: Home / Self Care | Payer: Medicare PPO

## 2021-02-06 ENCOUNTER — Emergency Department (HOSPITAL_BASED_OUTPATIENT_CLINIC_OR_DEPARTMENT_OTHER): Payer: Medicare PPO

## 2021-02-06 ENCOUNTER — Other Ambulatory Visit: Payer: Self-pay

## 2021-02-06 ENCOUNTER — Encounter: Payer: Self-pay | Admitting: Emergency Medicine

## 2021-02-06 DIAGNOSIS — Y900 Blood alcohol level of less than 20 mg/100 ml: Secondary | ICD-10-CM | POA: Insufficient documentation

## 2021-02-06 DIAGNOSIS — G459 Transient cerebral ischemic attack, unspecified: Secondary | ICD-10-CM | POA: Diagnosis not present

## 2021-02-06 DIAGNOSIS — Z79899 Other long term (current) drug therapy: Secondary | ICD-10-CM | POA: Diagnosis not present

## 2021-02-06 DIAGNOSIS — R262 Difficulty in walking, not elsewhere classified: Secondary | ICD-10-CM | POA: Insufficient documentation

## 2021-02-06 DIAGNOSIS — R8281 Pyuria: Secondary | ICD-10-CM | POA: Diagnosis present

## 2021-02-06 DIAGNOSIS — R519 Headache, unspecified: Secondary | ICD-10-CM | POA: Diagnosis present

## 2021-02-06 DIAGNOSIS — Z20822 Contact with and (suspected) exposure to covid-19: Secondary | ICD-10-CM | POA: Diagnosis not present

## 2021-02-06 HISTORY — DX: Unspecified osteoarthritis, unspecified site: M19.90

## 2021-02-06 LAB — CBC
HCT: 41.7 % (ref 39.0–52.0)
Hemoglobin: 14 g/dL (ref 13.0–17.0)
MCH: 30.9 pg (ref 26.0–34.0)
MCHC: 33.6 g/dL (ref 30.0–36.0)
MCV: 92.1 fL (ref 80.0–100.0)
Platelets: 194 10*3/uL (ref 150–400)
RBC: 4.53 MIL/uL (ref 4.22–5.81)
RDW: 12.3 % (ref 11.5–15.5)
WBC: 5.8 10*3/uL (ref 4.0–10.5)
nRBC: 0 % (ref 0.0–0.2)

## 2021-02-06 LAB — DIFFERENTIAL
Abs Immature Granulocytes: 0.02 10*3/uL (ref 0.00–0.07)
Basophils Absolute: 0 10*3/uL (ref 0.0–0.1)
Basophils Relative: 0 %
Eosinophils Absolute: 0 10*3/uL (ref 0.0–0.5)
Eosinophils Relative: 1 %
Immature Granulocytes: 0 %
Lymphocytes Relative: 16 %
Lymphs Abs: 0.9 10*3/uL (ref 0.7–4.0)
Monocytes Absolute: 0.5 10*3/uL (ref 0.1–1.0)
Monocytes Relative: 9 %
Neutro Abs: 4.3 10*3/uL (ref 1.7–7.7)
Neutrophils Relative %: 74 %

## 2021-02-06 LAB — URINALYSIS, ROUTINE W REFLEX MICROSCOPIC
Bilirubin Urine: NEGATIVE
Glucose, UA: NEGATIVE mg/dL
Hgb urine dipstick: NEGATIVE
Ketones, ur: NEGATIVE mg/dL
Nitrite: NEGATIVE
Specific Gravity, Urine: 1.014 (ref 1.005–1.030)
WBC, UA: >50 WBC/hpf — ABNORMAL HIGH (ref 0–5)
pH: 7.5 (ref 5.0–8.0)

## 2021-02-06 LAB — COMPREHENSIVE METABOLIC PANEL
ALT: 11 U/L (ref 0–44)
AST: 17 U/L (ref 15–41)
Albumin: 4.3 g/dL (ref 3.5–5.0)
Alkaline Phosphatase: 67 U/L (ref 38–126)
Anion gap: 7 (ref 5–15)
BUN: 9 mg/dL (ref 8–23)
CO2: 29 mmol/L (ref 22–32)
Calcium: 10.3 mg/dL (ref 8.9–10.3)
Chloride: 102 mmol/L (ref 98–111)
Creatinine, Ser: 0.77 mg/dL (ref 0.61–1.24)
GFR, Estimated: >60 mL/min (ref >60)
Glucose, Bld: 153 mg/dL — ABNORMAL HIGH (ref 70–99)
Potassium: 3.9 mmol/L (ref 3.5–5.1)
Sodium: 138 mmol/L (ref 135–145)
Total Bilirubin: 1 mg/dL (ref 0.3–1.2)
Total Protein: 7.8 g/dL (ref 6.5–8.1)

## 2021-02-06 LAB — RAPID URINE DRUG SCREEN, HOSP PERFORMED
Amphetamines: NOT DETECTED
Barbiturates: NOT DETECTED
Benzodiazepines: NOT DETECTED
Cocaine: NOT DETECTED
Opiates: NOT DETECTED
Tetrahydrocannabinol: NOT DETECTED

## 2021-02-06 LAB — CBG MONITORING, ED: Glucose-Capillary: 146 mg/dL — ABNORMAL HIGH (ref 70–99)

## 2021-02-06 LAB — RESP PANEL BY RT-PCR (FLU A&B, COVID) ARPGX2
Influenza A by PCR: NEGATIVE
Influenza B by PCR: NEGATIVE
SARS Coronavirus 2 by RT PCR: NEGATIVE

## 2021-02-06 LAB — ETHANOL: Alcohol, Ethyl (B): 13 mg/dL — ABNORMAL HIGH (ref <10)

## 2021-02-06 LAB — PROTIME-INR
INR: 1.1 (ref 0.8–1.2)
Prothrombin Time: 13.9 seconds (ref 11.4–15.2)

## 2021-02-06 LAB — APTT: aPTT: 32 seconds (ref 24–36)

## 2021-02-06 IMAGING — CT CT HEAD W/O CM
4 series · 17 of 47 positions shown, 19 images · non-contrast
Comparison: None.

CLINICAL DATA: Left-sided headache, dizziness

EXAM:
CT HEAD WITHOUT CONTRAST
TECHNIQUE: Contiguous axial images were obtained from the base of the skull
through the vertex without intravenous contrast.

[Series 2: head wo · axial · 0.48mm/px · z∈[+1195,+1315]mm · 7 of 33 slices shown, 9 images]
[im 5/33  brain]
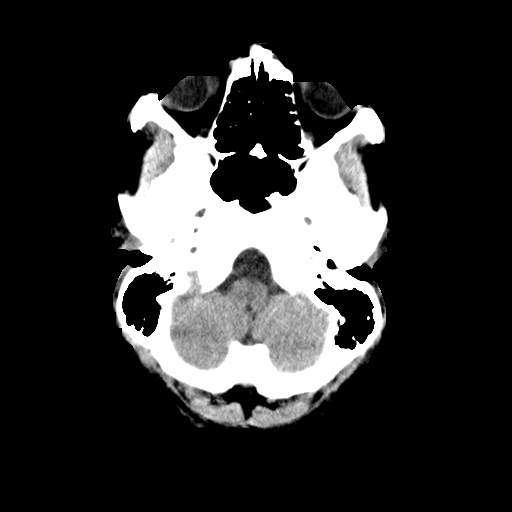
[im 5/33  bone]
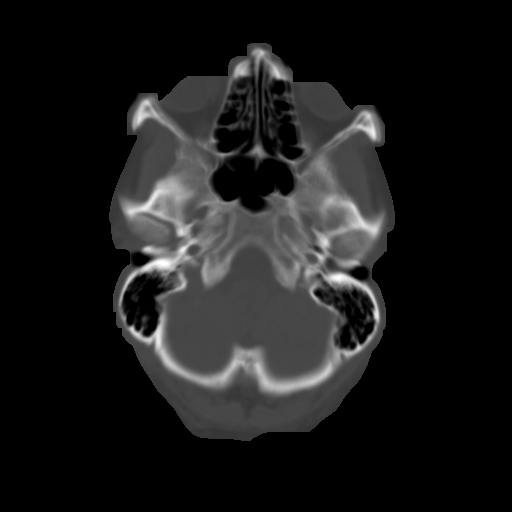
[im 9/33  brain]
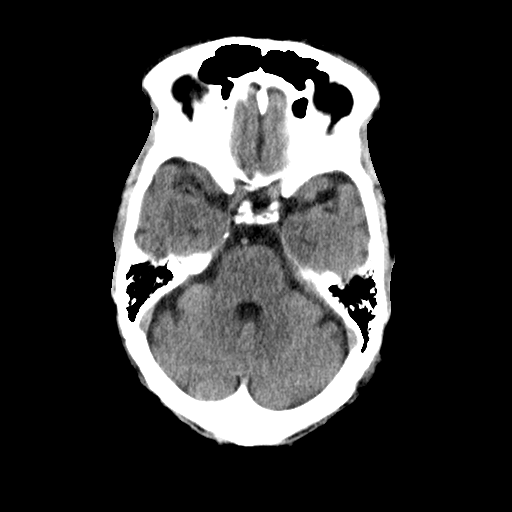
[im 13/33  brain]
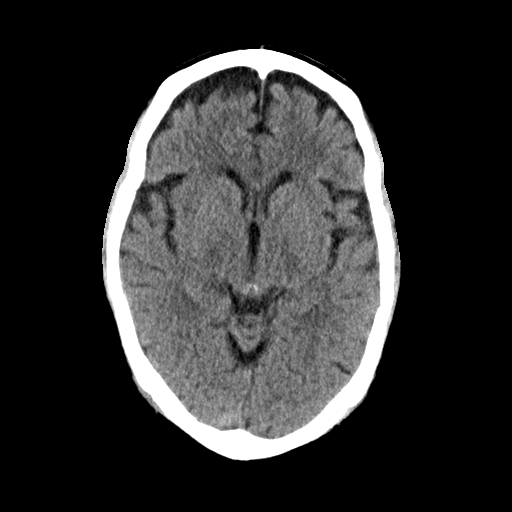
[im 17/33  brain]
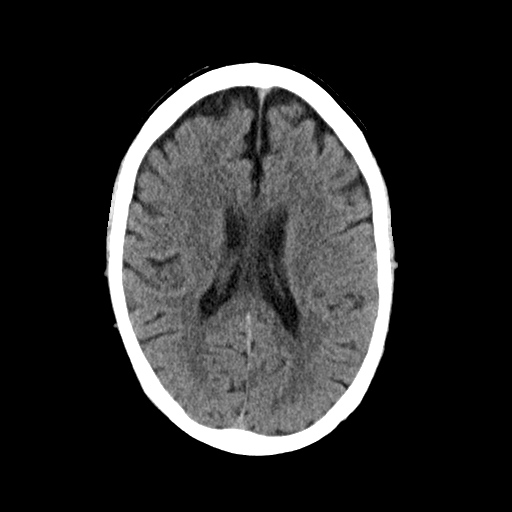
[im 21/33  brain]
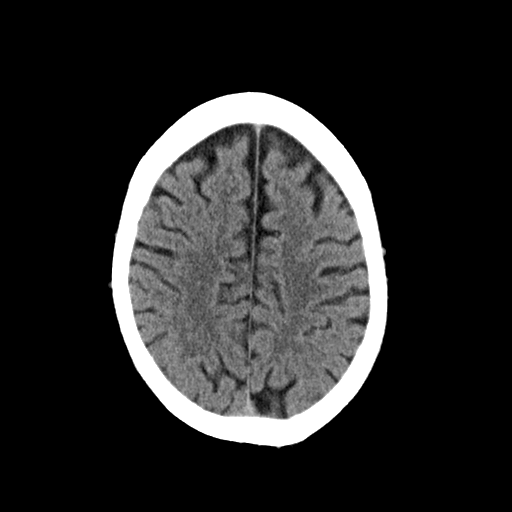
[im 21/33  bone]
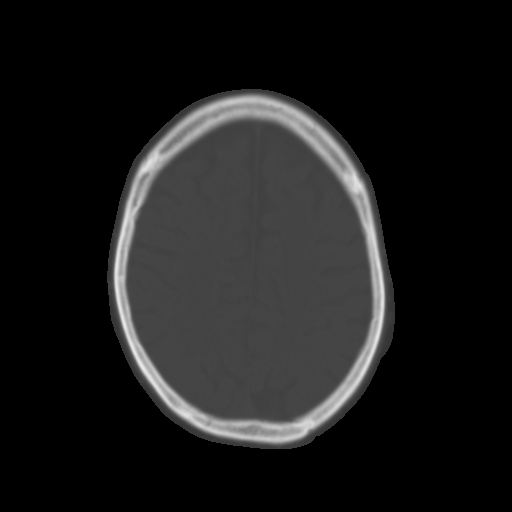
[im 25/33  brain]
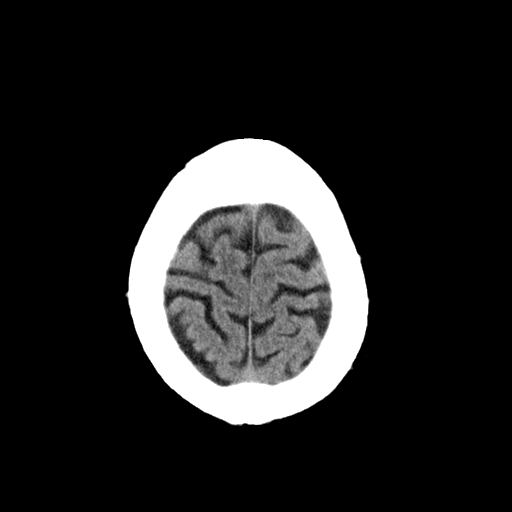
[im 29/33  brain]
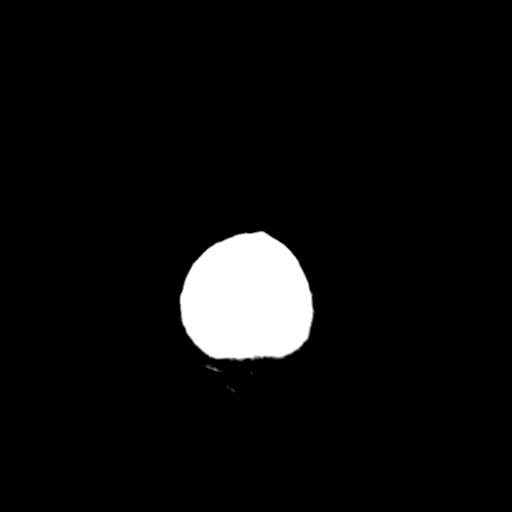

[Series 3: head bone · axial · 0.48mm/px · z∈[+1191,+1247]mm · 4 of 81 slices shown]
[im 9/81  bone]
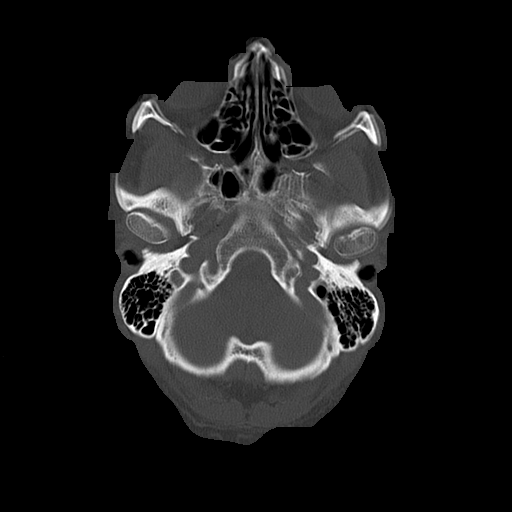
[im 17/81  bone]
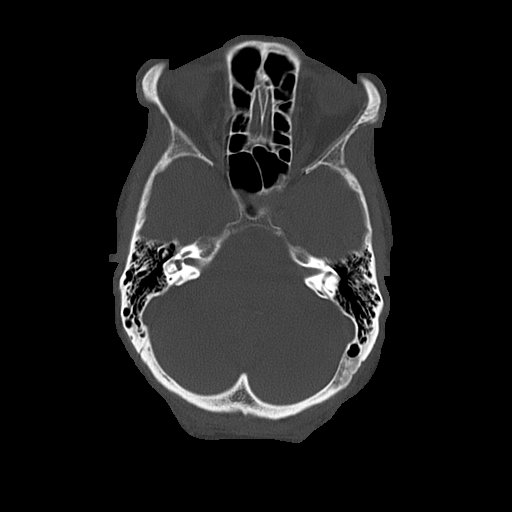
[im 25/81  bone]
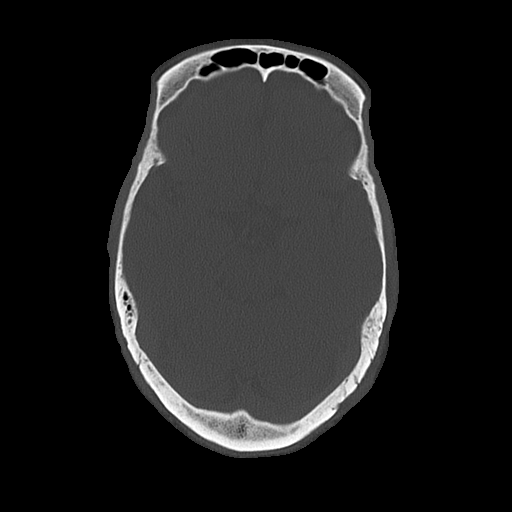
[im 37/81  bone]
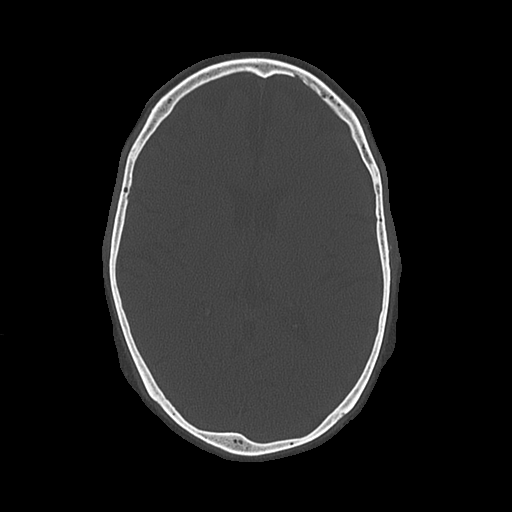

[Series 4: coronal soft · coronal · 0.34mm/px · 3 of 72 slices shown]
[im 24/72  brain]
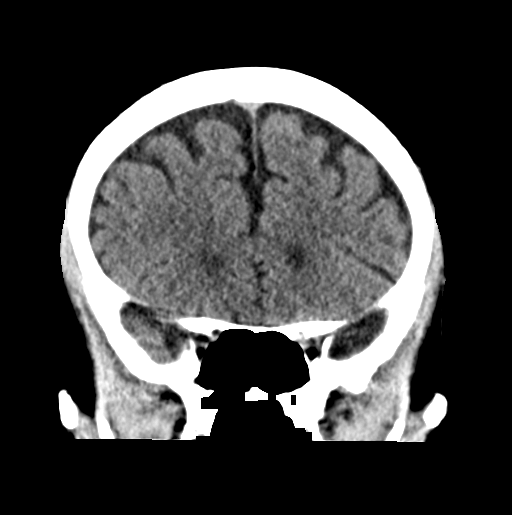
[im 32/72  brain]
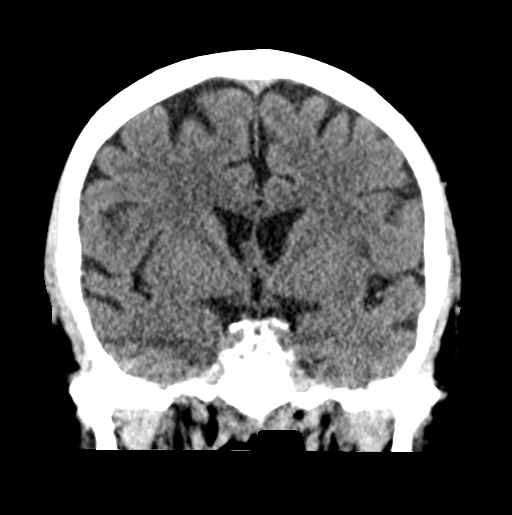
[im 40/72  brain]
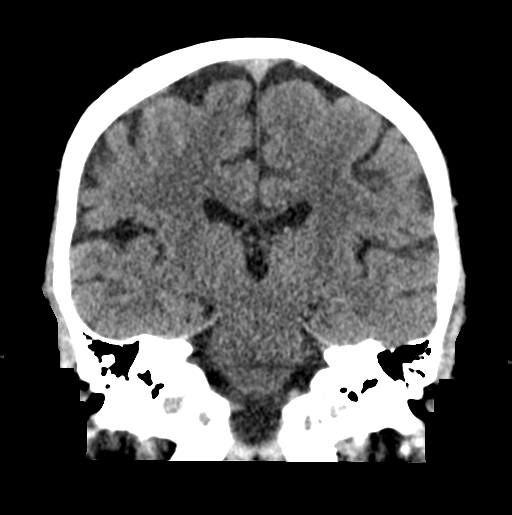

[Series 5: sagittal soft · sagittal · 0.31mm/px · 3 of 56 slices shown]
[im 19/56  brain]
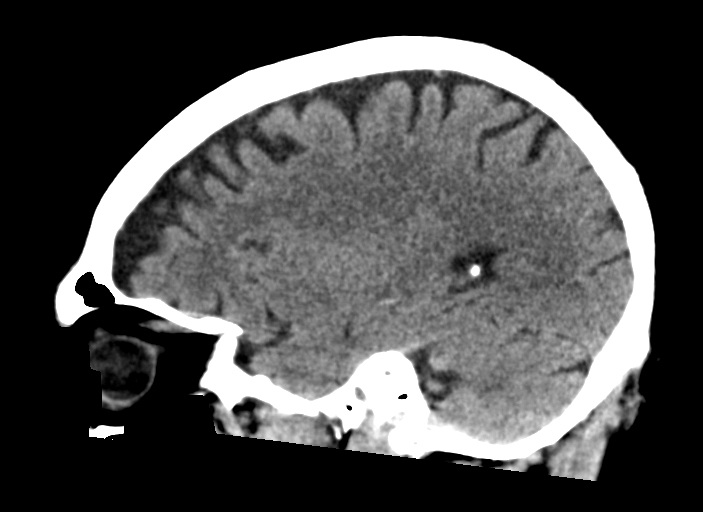
[im 28/56  brain]
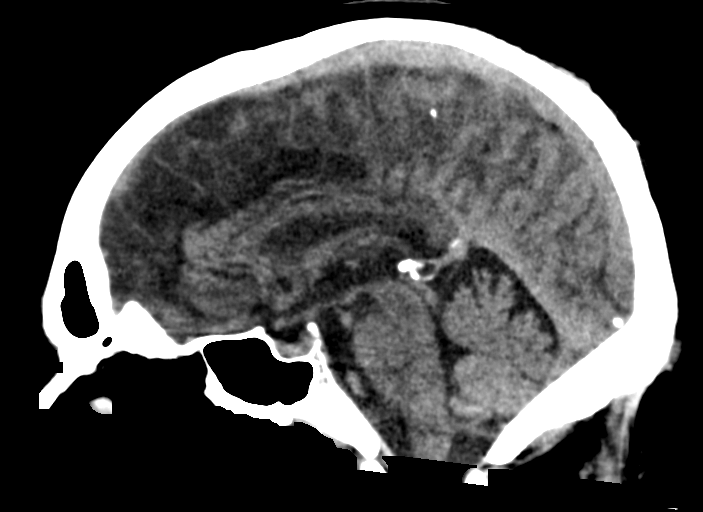
[im 37/56  brain]
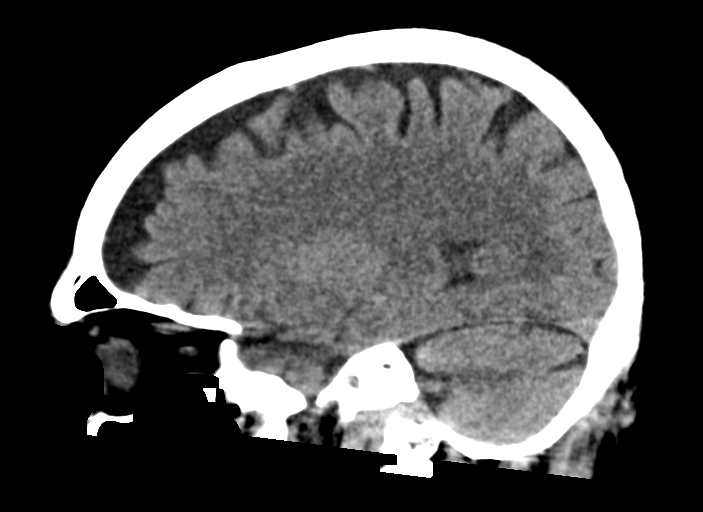

[17 of 47 positions shown; findings below may reference images not displayed]

FINDINGS: Brain: No evidence of acute infarction, hemorrhage, hydrocephalus,
extra-axial collection or mass lesion/mass effect. Mild age related
cerebral volume loss.

Vascular: No hyperdense vessel or unexpected calcification.

Skull: Normal. Negative for fracture or focal lesion.

Sinuses/Orbits: No acute finding.

Other: None.
IMPRESSION: No acute intracranial findings.

## 2021-02-06 NOTE — ED Notes (Signed)
Per admitting MD patient is able to eat something at this time.

## 2021-02-06 NOTE — ED Notes (Signed)
Patient is being discharged from the Urgent Care and sent to the Emergency Department via POV driven by friend . Per Erma Pinto PA, patient is in need of higher level of care due to dizziness and headache. Patient is aware and verbalizes understanding of plan of care.  Vitals:   02/06/21 1308  BP: 116/76  Pulse: 89  Resp: 16  Temp: 98 F (36.7 C)  SpO2: 98%

## 2021-02-06 NOTE — ED Notes (Signed)
Called [facility] truck is in route

## 2021-02-06 NOTE — Progress Notes (Signed)
Nurse at [facility] requesting diet. Patient has passed his swallow screen, diet ordered.  Orland Mustard, MD [facility]

## 2021-02-06 NOTE — ED Notes (Signed)
Tried to call report again and they said "charge nurse said they are holding off on the moment" and will call back when they are ready.

## 2021-02-06 NOTE — Progress Notes (Signed)
Received a phone call from Facility: [facility]  Requesting MD: Dr. Allen/Dr. Anitra Lauth Patient with h/o bph and aortic atherosclerosis presenting with dizziness, falling to the right that has resolved. Concern for TIA, discussed with neurology who asked to admit for stroke rule out. Vitals and labs stable.   Plan of care: admit to tele for TIA/Stroke rule out. Needs MRI/neuro if MRI +  The patient will be accepted for admission to telemetry at [facility] when bed is available.   Nursing staff, Please call the [facility] Admits & Consults System-Wide number at the top of Amion at the time of the patient's arrival so that the patient can be paged to the admitting physician.   Lanney Gins, M.D. [facility]

## 2021-02-06 NOTE — ED Provider Notes (Signed)
[facility] EMERGENCY DEPT Provider Note   CSN: [id number] Arrival date & time: 02/06/21  1349     History Chief Complaint  Patient presents with   Code Stroke   Dizziness    Colin Barron is a 75 y.o. male.  75 year old male presents with dizziness with left-sided headache and falling to his right which began this morning when he awoke at approximately 5 AM.  States that lasted for several minutes and has since resolved.  Noted that he tried to walk he was falling to his right side.  Denies any speech changes.  No confusion.  No falls from his.  Has had 2 other episodes each lasting for less than 5 minutes where he became severely dizzy with worsening left-sided headache and falling to his right.  He denied any peripheral weakness with this.  States when I feels back to normal.  Has no prior history of this.  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   BPH (benign prostatic hyperplasia) 12/08/2016    Past Surgical History:  Procedure Laterality Date   BIOPSY PROSTATE     CHOLECYSTECTOMY     CYST REMOVAL NECK     TONSILLECTOMY         Family History  Problem Relation Age of Onset   Diabetes Mother    Hypertension Mother    Intracerebral hemorrhage Brother    Hypertension Brother    Stroke Maternal Grandmother    Stroke Maternal Grandfather    Hypertension Brother        POSS    Social History   Tobacco Use   Smoking status: Never   Smokeless tobacco: Never  Vaping Use   Vaping Use: Never used  Substance Use Topics   Alcohol use: No   Drug use: No    Home Medications Prior to Admission medications   Medication Sig Start Date  End Date Taking? Authorizing Provider  benzonatate (TESSALON) 100 MG capsule Take 1 capsule (100 mg total) by mouth 2 (two) times daily as needed for cough. 01/02/21   Berniece Salines, FNP  Cholecalciferol (VITAMIN D3 PO) Take by mouth daily.    [provider]  Omega-3 Fatty Acids (FISH OIL PO) Take by mouth.    [provider]  Polyethyl Glycol-Propyl Glycol (SYSTANE) 0.4-0.3 % SOLN Apply to eye daily.    [provider]    Allergies    Amoxicillin  Review of Systems   Review of Systems  All other systems reviewed and are negative.  Physical Exam Updated Vital Signs BP (!) 148/81   Pulse 87   Temp 97.6 F (36.4 C) (Oral)   Resp 14   Ht 1.803 m (5\' 11" )   Wt 84.4 kg   SpO2 100%   BMI 25.94 kg/m   Physical Exam Vitals and nursing note reviewed.  Constitutional:      General: He is not in acute distress.    Appearance: Normal appearance. He is well-developed. He is not toxic-appearing.  HENT:     Head: Normocephalic and atraumatic.  Eyes:     General: Lids are normal.     Conjunctiva/sclera: Conjunctivae normal.  Pupils: Pupils are equal, round, and reactive to light.  Neck:     Thyroid: No thyroid mass.     Trachea: No tracheal deviation.  Cardiovascular:     Rate and Rhythm: Normal rate and regular rhythm.     Heart sounds: Normal heart sounds. No murmur heard.   No gallop.  Pulmonary:     Effort: Pulmonary effort is normal. No respiratory distress.     Breath sounds: Normal breath sounds. No stridor. No decreased breath sounds, wheezing, rhonchi or rales.  Abdominal:     General: There is no distension.     Palpations: Abdomen is soft.     Tenderness: There is no abdominal tenderness. There is no rebound.  Musculoskeletal:        General: No tenderness. Normal range of motion.     Cervical back: Normal range of motion and neck supple.  Skin:    General: Skin is warm and dry.     Findings: No abrasion or rash.  Neurological:      General: No focal deficit present.     Mental Status: He is alert and oriented to person, place, and time. Mental status is at baseline.     GCS: GCS eye subscore is 4. GCS verbal subscore is 5. GCS motor subscore is 6.     Cranial Nerves: Cranial nerves are intact. No cranial nerve deficit.     Sensory: No sensory deficit.     Motor: Motor function is intact.  Psychiatric:        Attention and Perception: Attention normal.        Speech: Speech normal.        Behavior: Behavior normal.    ED Results / Procedures / Treatments   Labs (all labs ordered are listed, but only abnormal results are displayed) Labs Reviewed  RESP PANEL BY RT-PCR (FLU A&B, COVID) [id number]  BASIC METABOLIC PANEL  ETHANOL  PROTIME-INR  APTT  CBC  DIFFERENTIAL  COMPREHENSIVE METABOLIC PANEL  RAPID URINE DRUG SCREEN, HOSP PERFORMED  URINALYSIS, ROUTINE W REFLEX MICROSCOPIC    EKG EKG Interpretation  Date/Time:  Friday February 06 2021 13:58:02 EDT Ventricular Rate:  87 PR Interval:  151 QRS Duration: 91 QT Interval:  354 QTC Calculation: 426 R Axis:   -1 Text Interpretation: Sinus rhythm Low voltage, precordial leads Abnormal R-wave progression, early transition Baseline wander in lead(s) V2 Confirmed by Lorre Nick ([id number]) on 02/06/2021 1:59:45 PM  Radiology No results found.  Procedures Procedures   Medications Ordered in ED Medications - No data to display  ED Course  I have reviewed the triage vital signs and the nursing notes.  Pertinent labs & imaging results that were available during my care of the patient were reviewed by me and considered in my medical decision making (see chart for details).    MDM Rules/Calculators/A&P                           Pt has nih of 0 Head ct neg Concern for TIA D/w dr Lum Babe with plan to send to [facility] for tia Will admit to hospitalist Final Clinical Impression(s) / ED Diagnoses Final diagnoses:  None    Rx / DC Orders ED  Discharge Orders     None        Lorre Nick, MD 02/06/21 1515

## 2021-02-06 NOTE — ED Triage Notes (Signed)
Complains that around 5am started having dizziness and left sided weakness.  Patient complains of sharp pain over left eye. Per family speech is normal.  Patient does have soften nasolabial fold on left side.

## 2021-02-06 NOTE — ED Notes (Signed)
Attempted to call report and was told "they are still figuring the schedule out". Will attempt to call report again shortly.

## 2021-02-06 NOTE — ED Notes (Signed)
Pt given a lean cuisine to eat. Son at the bedside.

## 2021-02-06 NOTE — ED Triage Notes (Addendum)
Woke up at 5 am and felt too dizzy to get up with a left sided headache. Denies hx of HTN, DM, TIA, CVA. Denies being currently dizzy. States it lasted around 5 minutes, then it improved. Had 2 more episodes that were similar in nature. Recently dx with covid in September

## 2021-02-06 NOTE — ED Notes (Signed)
Pt stated that he felt fine and denies any pain, headache orc complications

## 2021-02-07 ENCOUNTER — Encounter (HOSPITAL_COMMUNITY): Payer: Self-pay | Admitting: Family Medicine

## 2021-02-07 ENCOUNTER — Observation Stay (HOSPITAL_COMMUNITY): Payer: Medicare PPO

## 2021-02-07 ENCOUNTER — Observation Stay (HOSPITAL_BASED_OUTPATIENT_CLINIC_OR_DEPARTMENT_OTHER): Payer: Medicare PPO

## 2021-02-07 DIAGNOSIS — G459 Transient cerebral ischemic attack, unspecified: Secondary | ICD-10-CM

## 2021-02-07 DIAGNOSIS — Y900 Blood alcohol level of less than 20 mg/100 ml: Secondary | ICD-10-CM | POA: Diagnosis not present

## 2021-02-07 DIAGNOSIS — R8281 Pyuria: Secondary | ICD-10-CM

## 2021-02-07 DIAGNOSIS — Z20822 Contact with and (suspected) exposure to covid-19: Secondary | ICD-10-CM | POA: Diagnosis not present

## 2021-02-07 DIAGNOSIS — R519 Headache, unspecified: Secondary | ICD-10-CM | POA: Diagnosis present

## 2021-02-07 DIAGNOSIS — Z79899 Other long term (current) drug therapy: Secondary | ICD-10-CM | POA: Diagnosis not present

## 2021-02-07 LAB — ECHOCARDIOGRAM COMPLETE
AR max vel: 4.25 cm2
AV Area VTI: 3.66 cm2
AV Area mean vel: 3.88 cm2
AV Mean grad: 2 mmHg
AV Peak grad: 3.3 mmHg
Ao pk vel: 0.91 m/s
Area-P 1/2: 2.93 cm2
Height: 71 in
S' Lateral: 2.8 cm
Weight: 2976 oz

## 2021-02-07 LAB — LIPID PANEL
Cholesterol: 142 mg/dL (ref 0–200)
HDL: 54 mg/dL (ref >40)
LDL Cholesterol: 79 mg/dL (ref 0–99)
Total CHOL/HDL Ratio: 2.6 RATIO
Triglycerides: 44 mg/dL (ref <150)
VLDL: 9 mg/dL (ref 0–40)

## 2021-02-07 LAB — HEMOGLOBIN A1C
Hgb A1c MFr Bld: 4.5 % — ABNORMAL LOW (ref 4.8–5.6)
Mean Plasma Glucose: 82.45 mg/dL

## 2021-02-07 LAB — SEDIMENTATION RATE: Sed Rate: 4 mm/hr (ref 0–16)

## 2021-02-07 IMAGING — CT CT ANGIO HEAD-NECK (W OR W/O PERF)
2 of 8 series · 7 of 33 positions shown · IV contrast (omnipaque)
Comparison: Noncontrast CT head obtained 1 day prior

CLINICAL DATA: Left-sided headache and dizziness, stroke workup

EXAM:
CT ANGIOGRAPHY HEAD AND NECK
TECHNIQUE: Multidetector CT imaging of the head and neck was performed using
the standard protocol during bolus administration of intravenous
contrast. Multiplanar CT image reconstructions and MIPs were
obtained to evaluate the vascular anatomy. Carotid stenosis
measurements (when applicable) are obtained utilizing NASCET
criteria, using the distal internal carotid diameter as the
denominator.
CONTRAST:  50mL OMNIPAQUE IOHEXOL 350 MG/ML SOLN

[Series 6: cta neck/head · axial · 0.66mm/px · z∈[-244,-144]mm · 2 of 151 slices shown]
[im 51/151  soft-tissue]
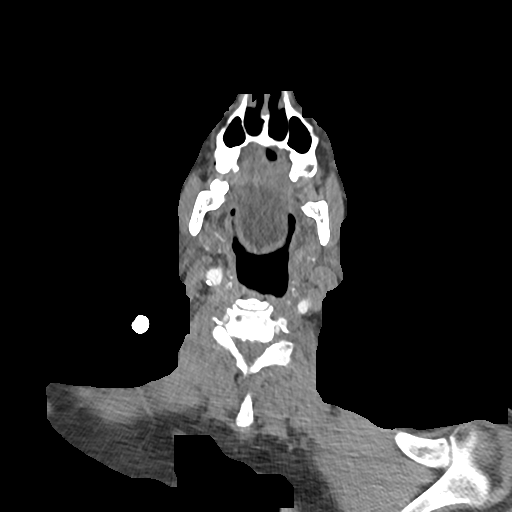
[im 101/151  soft-tissue]
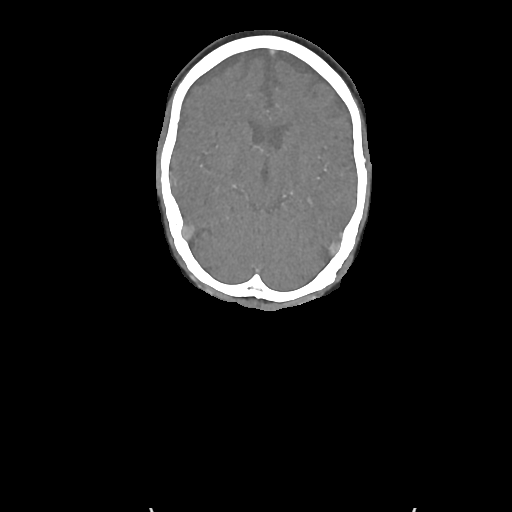

[Series 8: ax thins · axial · 0.39mm/px · z∈[-308,-96]mm · 5 of 318 slices shown]
[im 53/318  soft-tissue]
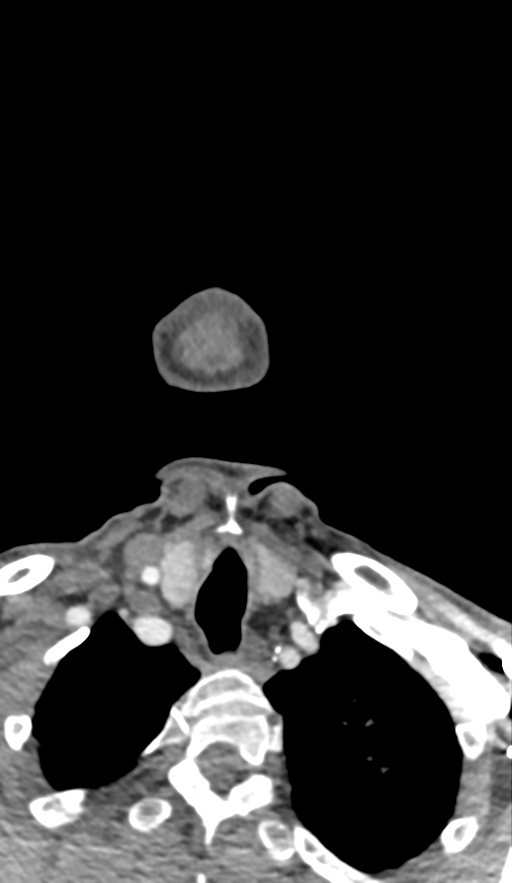
[im 106/318  bone]
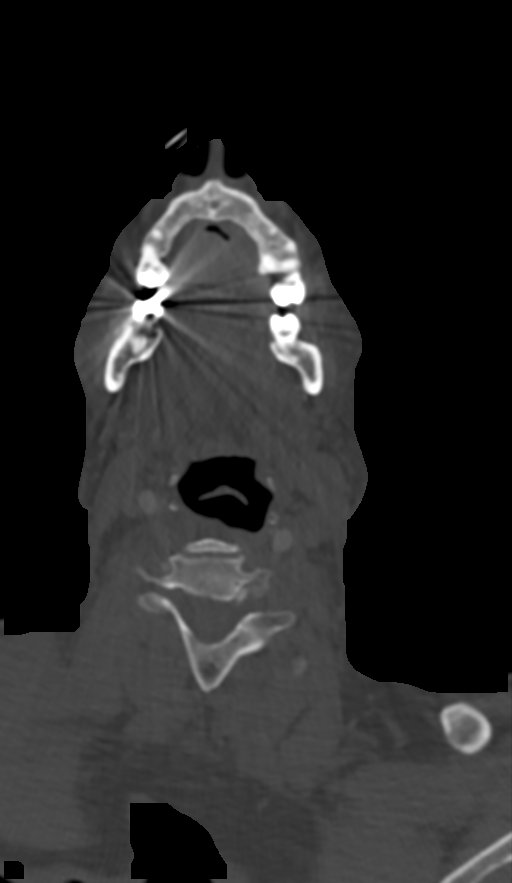
[im 159/318  soft-tissue]
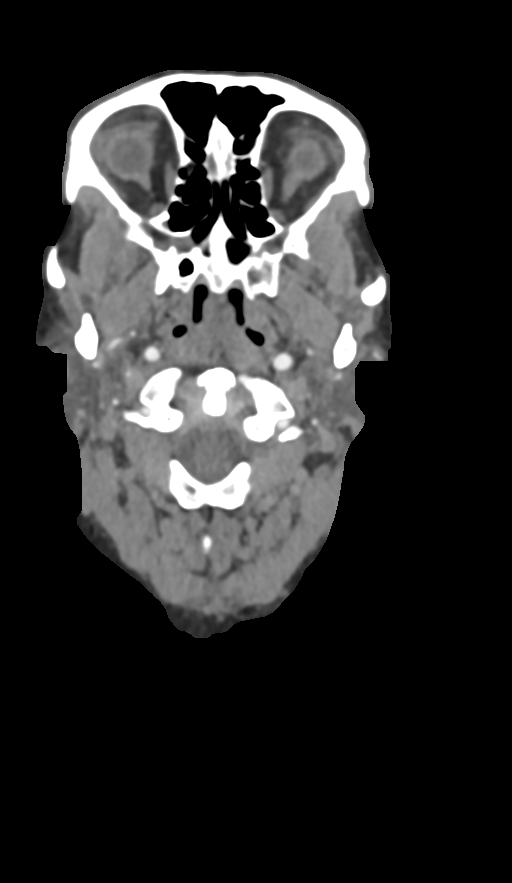
[im 212/318  bone]
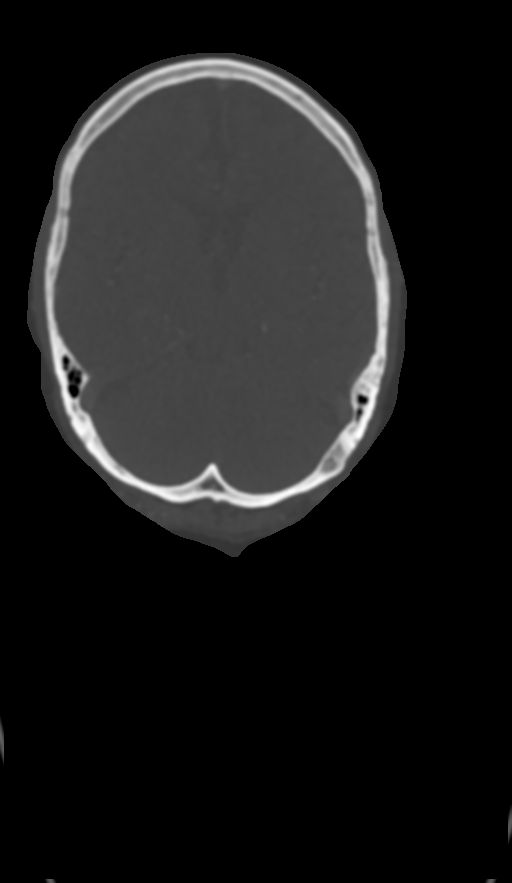
[im 265/318  soft-tissue]
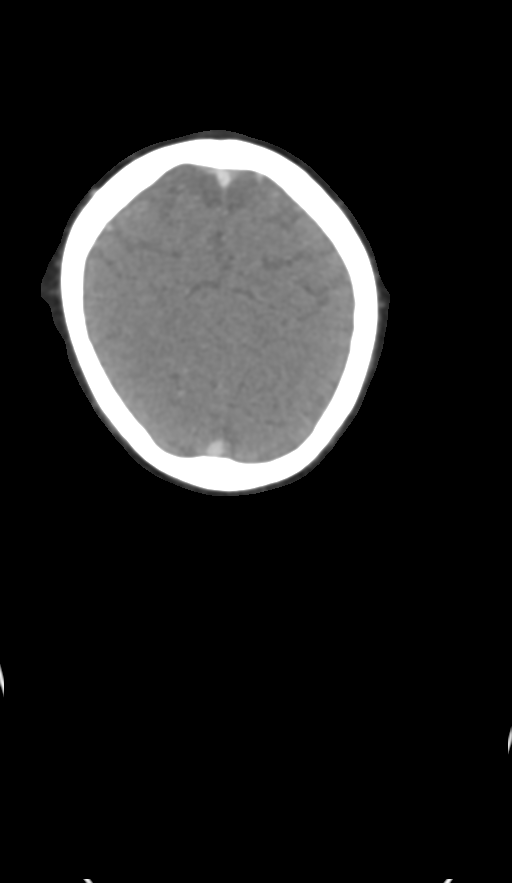

[7 of 33 positions shown; findings below may reference images not displayed]

FINDINGS: CTA NECK FINDINGS

Aortic arch: Standard branching. Imaged portion shows no evidence of
aneurysm or dissection. No significant stenosis of the major arch
vessel origins.

Right carotid system: There is mild calcified atherosclerotic plaque
in the proximal right internal carotid artery without
hemodynamically significant stenosis or occlusion. There is no
dissection or aneurysm.

Left carotid system: There is mild calcified atherosclerotic plaque
in the proximal left internal carotid artery without hemodynamically
significant stenosis or occlusion. There is no dissection or
aneurysm.

Vertebral arteries: Codominant. No evidence of dissection, stenosis
(50% or greater) or occlusion.

Skeleton: The a Ng act 2 there is mild multilevel degenerative
change of the cervical spine. There is no acute osseous abnormality
or aggressive osseous lesion.

Other neck: The soft tissues are unremarkable.

Upper chest: There are prominent upper mediastinal lymph nodes
measuring up to 1.5 cm. The lung apices appear clear.

Review of the MIP images confirms the above findings

CTA HEAD FINDINGS

Anterior circulation: There is mild calcification of the bilateral
cavernous ICAs without hemodynamically significant stenosis or
occlusion.

The bilateral MCAs are patent. There is multifocal irregularity of
the distal right MCA branches likely reflecting atherosclerotic
disease.

The bilateral ACAs are patent with multifocal irregularity and
narrowing of the right A2 segment. There is no occlusion.

There is no aneurysm.

Posterior circulation: The bilateral V4 segments are patent. The
basilar artery is patent.

There is a fetal PCA on the right.  The bilateral PCAs are patent.

There is no aneurysm.

Venous sinuses: As permitted by contrast timing, patent.

Anatomic variants: As above.

Review of the MIP images confirms the above findings
IMPRESSION: 1. Mild calcified atherosclerotic plaque in the bilateral carotid
bulbs without hemodynamically significant stenosis or occlusion.
Otherwise, patent vasculature of the neck.
2. Multifocal irregularity in the distal right MCA and ACA branches
likely reflecting atherosclerotic disease. Mild calcified
atherosclerotic plaque of the bilateral cavernous ICAs without
significant stenosis or occlusion. Otherwise, patent vasculature of
the head.
3. Prominent upper mediastinal lymph nodes measuring up to 1.5 cm.
Consider dedicated imaging of the chest for better evaluation.

## 2021-02-07 IMAGING — MR MR HEAD W/O CM
6 of 10 series · 29 of 48 positions shown · non-contrast
Comparison: Same day CTA and CT head from yesterday.

CLINICAL DATA: Transient ischemic attack (TIA)

EXAM:
MRI HEAD WITHOUT CONTRAST
TECHNIQUE: Multiplanar, multiecho pulse sequences of the brain and surrounding
structures were obtained without intravenous contrast.

[Series 2: DWI · axial · 3.0mm · 0.94mm/px · z∈[-34,+119]mm · 9 of 101 slices shown (1 of 2)]
[im 1/101]
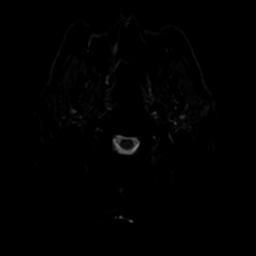
[im 13/101]
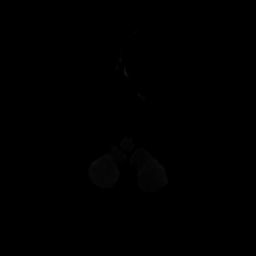
[im 26/101]
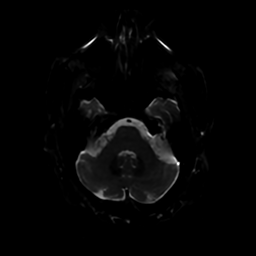
[im 38/101]
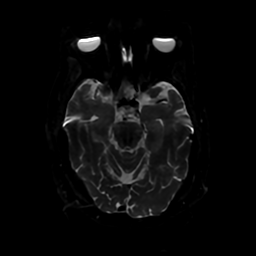
[im 51/101]
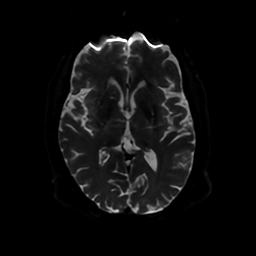
[im 63/101]
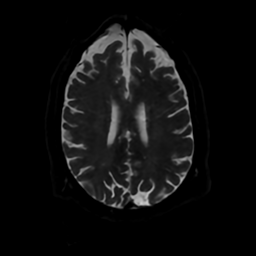
[im 76/101]
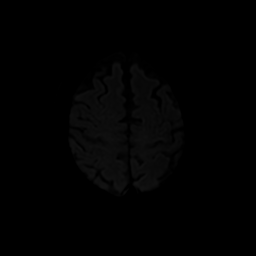
[im 88/101]
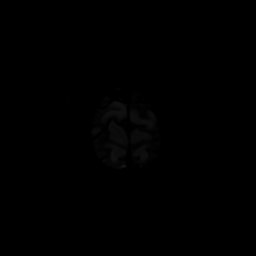
[im 101/101]
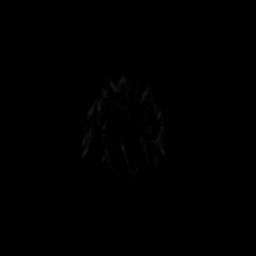

[Series 3: DWI · coronal · 4.0mm · 0.94mm/px · 7 of 77 slices shown (2 of 2)]
[im 1/77]
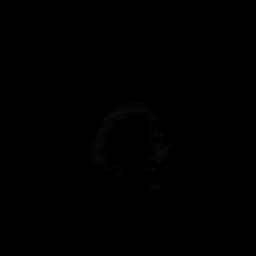
[im 13/77]
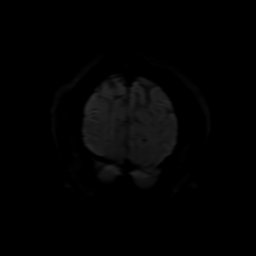
[im 26/77]
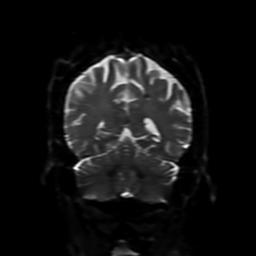
[im 39/77]
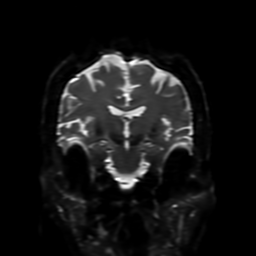
[im 51/77]
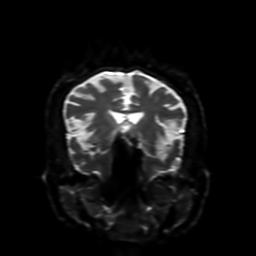
[im 64/77]
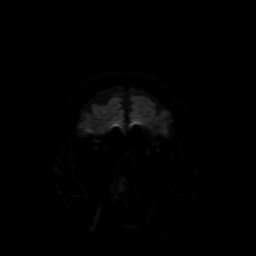
[im 77/77]
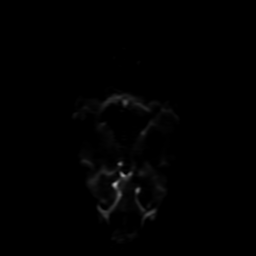

[Series 4: FLAIR · sagittal · 5.0mm · 0.23mm/px · 2 of 23 slices shown (1 of 2)]
[im 1/23]
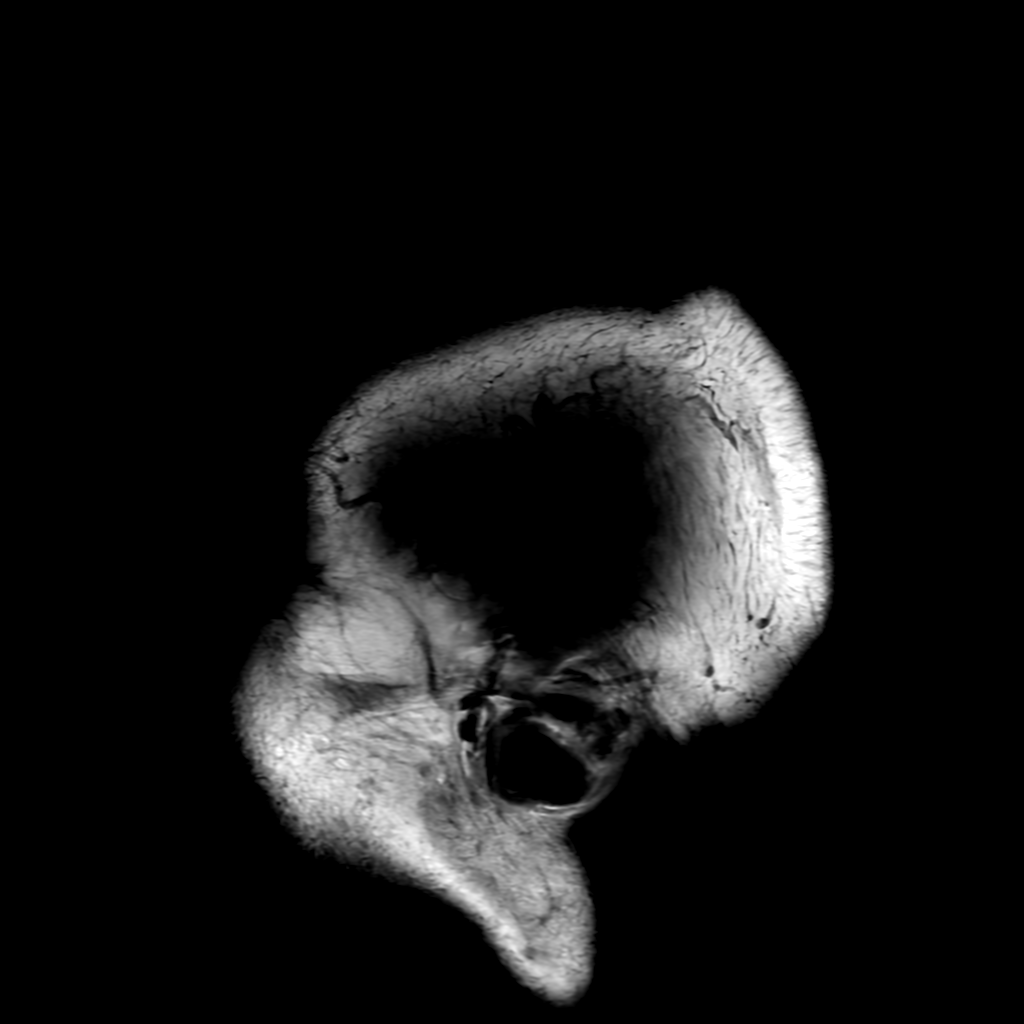
[im 23/23]
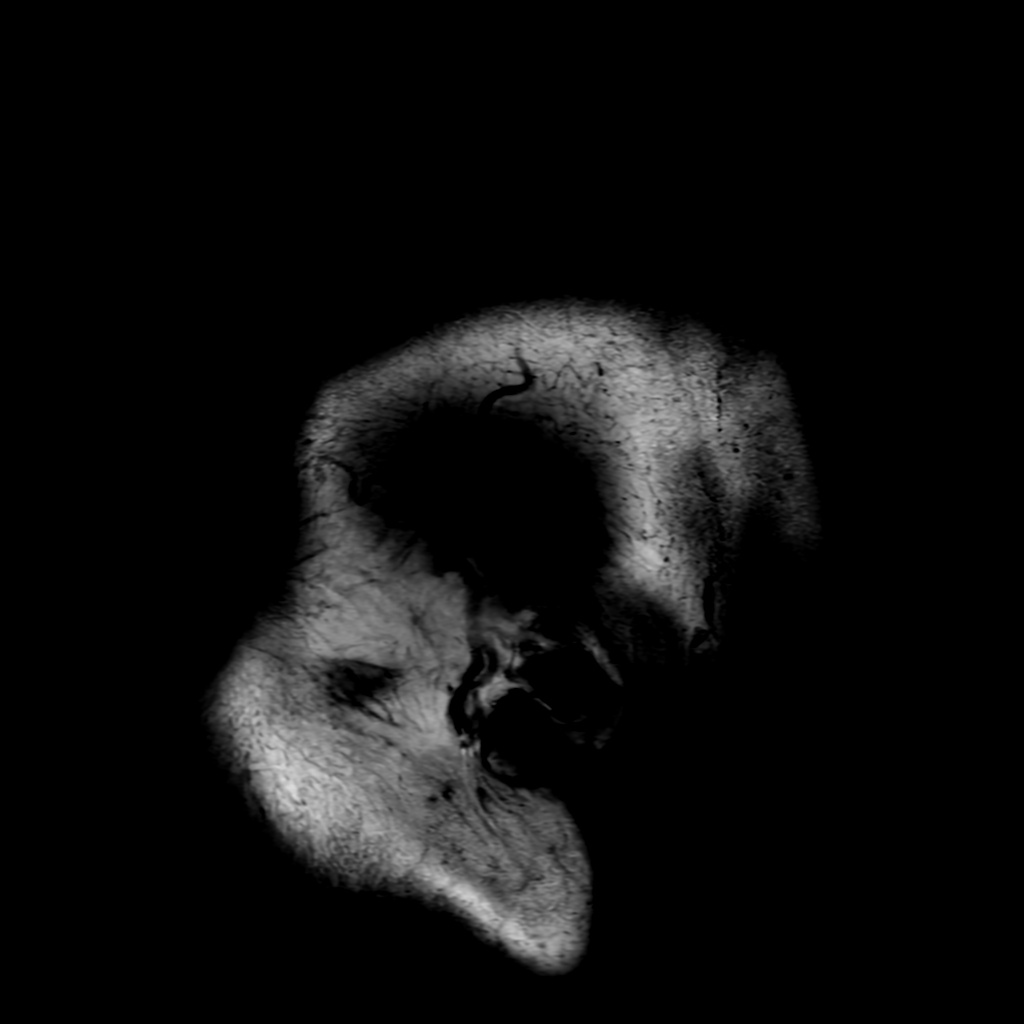

[Series 6: FLAIR · axial · 4.0mm · 0.45mm/px · z∈[-32,+117]mm · 3 of 35 slices shown (2 of 2)]
[im 1/35]
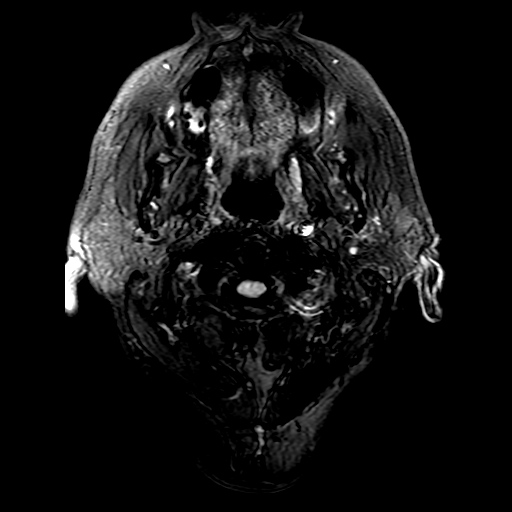
[im 18/35]
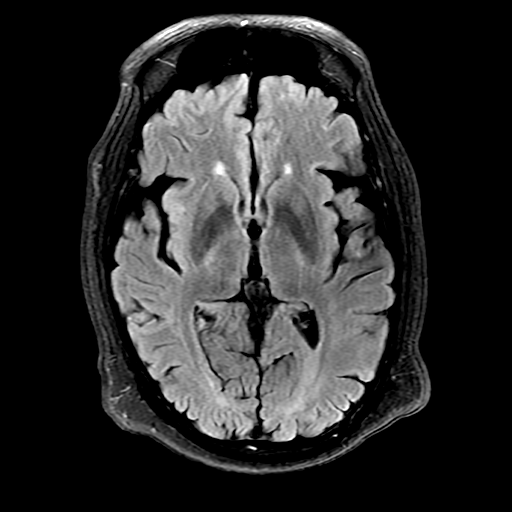
[im 35/35]
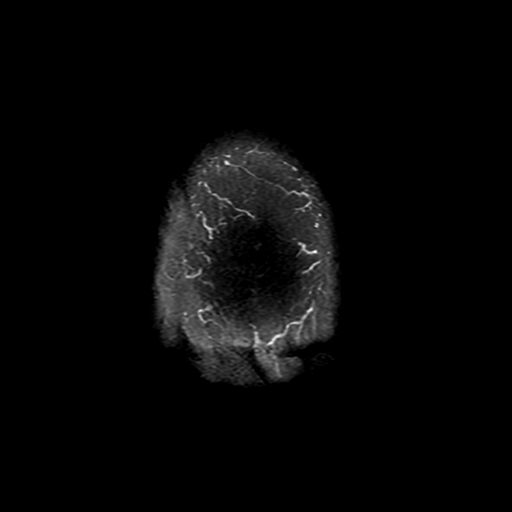

[Series 250: ADC · axial · 3.0mm · 0.94mm/px · z∈[-34,+119]mm · 5 of 52 slices shown (1 of 2)]
[im 1/52]
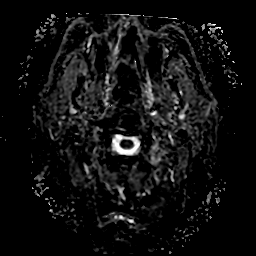
[im 13/52]
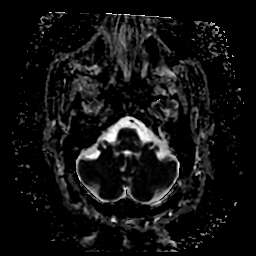
[im 26/52]
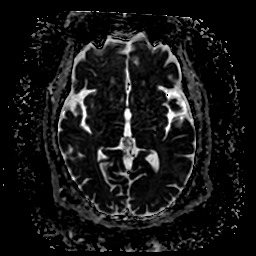
[im 39/52]
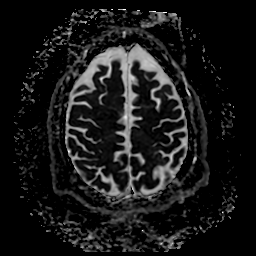
[im 52/52]
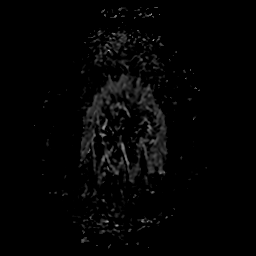

[Series 350: ADC · coronal · 4.0mm · 0.94mm/px · 3 of 39 slices shown (2 of 2)]
[im 1/39]
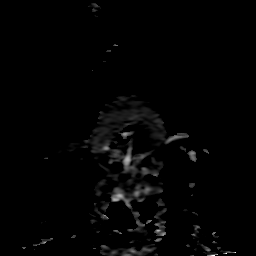
[im 20/39]
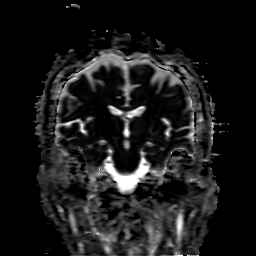
[im 39/39]
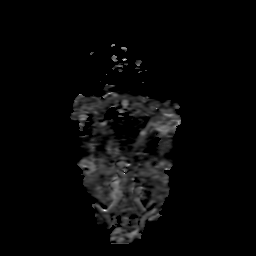

[29 of 48 positions shown; findings below may reference images not displayed]

FINDINGS: Brain: No acute infarction, acute hemorrhage, hydrocephalus,
extra-axial collection or mass lesion. Mild generalized atrophy.
Mild patchy white matter T2 hyperintensities, nonspecific but
compatible with chronic microvascular ischemic disease that is very
mild for age. Focus of susceptibility artifact in the left parietal
periventricular white matter without edema, likely the sequela prior
microhemorrhage.

Vascular: Major arterial flow voids are maintained at the skull
base.

Skull and upper cervical spine: Normal marrow signal.

Sinuses/Orbits: Mild paranasal sinus mucosal thickening.
Unremarkable orbits.

Other: No mastoid effusions.
IMPRESSION: No evidence of acute intracranial abnormality, including infarct.

## 2021-02-07 MED ORDER — ENOXAPARIN SODIUM 40 MG/0.4ML IJ SOSY
40.0000 mg | PREFILLED_SYRINGE | INTRAMUSCULAR | Status: DC
  Administered 2021-02-07 – 2021-02-08 (×2): 40 mg via SUBCUTANEOUS
  Filled 2021-02-07 (×2): qty 0.4

## 2021-02-07 MED ORDER — IOHEXOL 350 MG/ML SOLN
50.0000 mL | Freq: Once | INTRAVENOUS | Status: AC | PRN
  Administered 2021-02-07: 50 mL via INTRAVENOUS

## 2021-02-07 MED ORDER — ACETAMINOPHEN 160 MG/5ML PO SOLN: 650.0000 mg | ORAL | Status: DC | PRN

## 2021-02-07 MED ORDER — ATORVASTATIN CALCIUM 40 MG PO TABS
40.0000 mg | ORAL_TABLET | Freq: Every day | ORAL | Status: DC
  Administered 2021-02-07: 40 mg via ORAL
  Filled 2021-02-07: qty 1

## 2021-02-07 MED ORDER — ACETAMINOPHEN 325 MG PO TABS: 650.0000 mg | ORAL_TABLET | ORAL | Status: DC | PRN

## 2021-02-07 MED ORDER — ACETAMINOPHEN 650 MG RE SUPP: 650.0000 mg | RECTAL | Status: DC | PRN

## 2021-02-07 MED ORDER — ASPIRIN 325 MG PO TABS: 325.0000 mg | ORAL_TABLET | Freq: Every day | ORAL | Status: DC

## 2021-02-07 MED ORDER — STROKE: EARLY STAGES OF RECOVERY BOOK
Freq: Once | Status: AC
  Filled 2021-02-07: qty 1

## 2021-02-07 MED ORDER — ASPIRIN EC 81 MG PO TBEC
81.0000 mg | DELAYED_RELEASE_TABLET | Freq: Every day | ORAL | Status: DC
  Administered 2021-02-07 – 2021-02-08 (×2): 81 mg via ORAL
  Filled 2021-02-07 (×2): qty 1

## 2021-02-07 MED ORDER — ASPIRIN 300 MG RE SUPP: 300.0000 mg | Freq: Every day | RECTAL | Status: DC

## 2021-02-07 NOTE — ED Notes (Signed)
[facility] has arrived for patient 

## 2021-02-07 NOTE — Progress Notes (Signed)
  Echocardiogram 2D Echocardiogram has been performed.  Colin Barron F 02/07/2021, 11:23 AM

## 2021-02-07 NOTE — ED Notes (Signed)
Waiting for [facility] truck. Asked Diplomatic Services operational officer to call for updated.

## 2021-02-07 NOTE — H&P (Signed)
History and Physical    Colin Barron:[id number] DOB: 17-Jun-1945 DOA: 02/06/2021  PCP: Pcp, No  Patient coming from: home  I have personally briefly reviewed patient's old medical records in [facility]  Chief Complaint: Dizziness  HPI: Colin Barron is a 75 y.o. male with medical history significant of BPH.  Pt woke at 5am yesterday morning with c/o dizziness, L sided headache, and falling to the R side.  Lasted for several mins before resolving.  Had 2 other episodes each lasting for less than 5 mins. No confusion, no falls from this.  No weakness.  Presents to ED.   ED Course: Pt remains asymptomatic in ED.  CT head neg.  Pt transferred to [facility] for TIA workup.   Review of Systems: As per HPI, otherwise all review of systems negative.  Past Medical History:  Diagnosis Date   Arthritis    BPH (benign prostatic hyperplasia) 12/08/2016    Past Surgical History:  Procedure Laterality Date   BIOPSY PROSTATE     CHOLECYSTECTOMY     CYST REMOVAL NECK     TONSILLECTOMY       reports that he has never smoked. He has never used smokeless tobacco. He reports that he does not drink alcohol and does not use drugs.  Allergies  Allergen Reactions   Amoxicillin Other (See Comments)    Did it involve swelling of the face/tongue/throat, SOB, or low BP? No Did it involve sudden or severe rash/hives, skin peeling, or any reaction on the inside of your mouth or nose? No Did you need to seek medical attention at a hospital or doctor's office? No When did it last happen?   Over 10 years ago If all above answers are "NO", may proceed with cephalosporin use.  Brown Spots    Family History  Problem Relation Age of Onset   Diabetes Mother    Hypertension Mother    Intracerebral hemorrhage Brother    Hypertension Brother    Stroke Maternal Grandmother    Stroke Maternal Grandfather    Hypertension Brother        POSS     Prior to Admission medications    Medication Sig Start Date End Date Taking? Authorizing Provider  Cholecalciferol (VITAMIN D3 PO) Take by mouth daily.   Yes [provider]  Polyethyl Glycol-Propyl Glycol (SYSTANE) 0.4-0.3 % SOLN Apply to eye daily.   Yes [provider]  benzonatate (TESSALON) 100 MG capsule Take 1 capsule (100 mg total) by mouth 2 (two) times daily as needed for cough. 01/02/21   Berniece Salines, FNP  Omega-3 Fatty Acids (FISH OIL PO) Take by mouth.    [provider]    Physical Exam: Vitals:   02/07/21 0000 02/07/21 0100 02/07/21 0130 02/07/21 0311  BP: 126/78 115/75 124/82 139/79  Pulse: 69 (!) 59 (!) 59 71  Resp: 17 14 15 17   Temp:    97.9 F (36.6 C)  TempSrc:    Oral  SpO2: 96% 97% 97% 100%  Weight:      Height:        Constitutional: NAD, calm, comfortable Eyes: PERRL, lids and conjunctivae normal ENMT: Mucous membranes are moist. Posterior pharynx clear of any exudate or lesions.Normal dentition.  Neck: normal, supple, no masses, no thyromegaly Respiratory: clear to auscultation bilaterally, no wheezing, no crackles. Normal respiratory effort. No accessory muscle use.  Cardiovascular: Regular rate and rhythm, no murmurs / rubs / gallops. No extremity edema. 2+ pedal  pulses. No carotid bruits.  Abdomen: no tenderness, no masses palpated. No hepatosplenomegaly. Bowel sounds positive.  Musculoskeletal: no clubbing / cyanosis. No joint deformity upper and lower extremities. Good ROM, no contractures. Normal muscle tone.  Skin: no rashes, lesions, ulcers. No induration Neurologic: CN 2-12 grossly intact. Sensation intact, DTR normal. Strength 5/5 in all 4.  Psychiatric: Normal judgment and insight. Alert and oriented x 3. Normal mood.    Labs on Admission: I have personally reviewed following labs and imaging studies  CBC: Recent Labs  Lab 02/06/21 1404  WBC 5.8  NEUTROABS 4.3  HGB 14.0  HCT 41.7  MCV 92.1  PLT 194   Basic Metabolic Panel: Recent Labs   Lab 02/06/21 1404  NA 138  K 3.9  CL 102  CO2 29  GLUCOSE 153*  BUN 9  CREATININE 0.77  CALCIUM 10.3   GFR: Estimated Creatinine Clearance: 85 mL/min (by C-G formula based on SCr of 0.77 mg/dL). Liver Function Tests: Recent Labs  Lab 02/06/21 1404  AST 17  ALT 11  ALKPHOS 67  BILITOT 1.0  PROT 7.8  ALBUMIN 4.3   No results for input(s): LIPASE, AMYLASE in the last 168 hours. No results for input(s): AMMONIA in the last 168 hours. Coagulation Profile: Recent Labs  Lab 02/06/21 1404  INR 1.1   Cardiac Enzymes: No results for input(s): CKTOTAL, CKMB, CKMBINDEX, TROPONINI in the last 168 hours. BNP (last 3 results) No results for input(s): PROBNP in the last 8760 hours. HbA1C: No results for input(s): HGBA1C in the last 72 hours. CBG: Recent Labs  Lab 02/06/21 1407  GLUCAP 146*   Lipid Profile: No results for input(s): CHOL, HDL, LDLCALC, TRIG, CHOLHDL, LDLDIRECT in the last 72 hours. Thyroid Function Tests: No results for input(s): TSH, T4TOTAL, FREET4, T3FREE, THYROIDAB in the last 72 hours. Anemia Panel: No results for input(s): VITAMINB12, FOLATE, FERRITIN, TIBC, IRON, RETICCTPCT in the last 72 hours. Urine analysis:    Component Value Date/Time   COLORURINE YELLOW 02/06/2021 1558   APPEARANCEUR HAZY (A) 02/06/2021 1558   LABSPEC 1.014 02/06/2021 1558   PHURINE 7.5 02/06/2021 1558   GLUCOSEU NEGATIVE 02/06/2021 1558   HGBUR NEGATIVE 02/06/2021 1558   BILIRUBINUR NEGATIVE 02/06/2021 1558   BILIRUBINUR negative 03/16/2019 0845   KETONESUR NEGATIVE 02/06/2021 1558   PROTEINUR TRACE (A) 02/06/2021 1558   UROBILINOGEN 1.0 03/16/2019 0845   NITRITE NEGATIVE 02/06/2021 1558   LEUKOCYTESUR LARGE (A) 02/06/2021 1558    Radiological Exams on Admission: CT Head Wo Contrast  Result Date: 02/06/2021 CLINICAL DATA:  Left-sided headache, dizziness EXAM: CT HEAD WITHOUT CONTRAST TECHNIQUE: Contiguous axial images were obtained from the base of the skull  through the vertex without intravenous contrast. COMPARISON:  None. FINDINGS: Brain: No evidence of acute infarction, hemorrhage, hydrocephalus, extra-axial collection or mass lesion/mass effect. Mild age related cerebral volume loss. Vascular: No hyperdense vessel or unexpected calcification. Skull: Normal. Negative for fracture or focal lesion. Sinuses/Orbits: No acute finding. Other: None. IMPRESSION: No acute intracranial findings. Electronically Signed   By: Duanne Guess D.O.   On: 02/06/2021 15:01    EKG: Independently reviewed.  Assessment/Plan Principal Problem:   TIA (transient ischemic attack) Active Problems:   Pyuria    TIA - TIA pathway MRI / MRA head and neck 2d echo Tele monitor ASA 81 A1C FLP PT/OT Has passed swallow screen Pyuria - No obvious infectious S/Sx UCx ordered  DVT prophylaxis: Lovenox Code Status: Full Family Communication: No family in room Disposition Plan: Home  after TIA work up [employer] called: Neuro Admission status: Place in obs    Bawi Lakins M. DO [facility]  How to contact the [facility] Attending or Consulting provider 7A - 7P or covering provider during after hours 7P -7A, for this patient?  Check the care team in [facility] and look for a) attending/consulting TRH provider listed and b) the [facility] team listed Log into [email or web address]  Amion Physician Scheduling and messaging for groups and whole hospitals  On call and physician scheduling software for group practices, residents, hospitalists and other medical providers for call, clinic, rotation and shift schedules. OnCall [employer] is a hospital-wide system for scheduling doctors and paging doctors on call. EasyPlot is for scientific plotting and data analysis.  [email or web address]  and use [facility]'s universal password to access. If you do not have the password, please contact the hospital operator.  Locate the [facility] provider you are looking for under [facility] and page to a number  that you can be directly reached. If you still have difficulty reaching the provider, please page the [facility] (Director on Call) for the Hospitalists listed on amion for assistance.  02/07/2021, 5:13 AM

## 2021-02-07 NOTE — Progress Notes (Addendum)
Patient seen and examined, admitted earlier this morning by Dr. Alcario Drought, 75 year old male with history of BPH presented to the ED yesterday with left-sided headache dizziness and falling to the right side, acute onset of symptoms, seen by neurology in consultation, recommended stroke work-up -Patient is a poor historian, reports some improvement in symptoms -Check orthostatics, ESR -Follow-up MRI, MRA, echo, CTA neck -PT OT SLP eval today -Continue aspirin 81 mg daily  Domenic Polite, MD

## 2021-02-07 NOTE — Evaluation (Signed)
Occupational Therapy Evaluation Patient Details Name: Colin Barron MRN: [id number] DOB: 1946/02/24 Today's Date: 02/07/2021   History of Present Illness Pt is a 75 y.o. male admitted 10/14 for TIA. He presented to ED with c/o dizziness and L headache which had resolved by time of arrival. CT negative. MRI pending. PMH: BPH, recent +covid (Sept 2022).   Clinical Impression   Pt admitted for concerns listed above. PTA pt reported that he was independent with all ADL's and IADL's such as medication management. Pt requiring supervision to min guard for all ADL's and functional mobility, including using a RW, which he never used before. Pt will benefit from [facility] once discharged to maximize his return to independence with all ADL's and IADL's, including ensuring safety. OT will follow acutely.       Recommendations for follow up therapy are one component of a multi-disciplinary discharge planning process, led by the attending physician.  Recommendations may be updated based on patient status, additional functional criteria and insurance authorization.   Follow Up Recommendations  Home health OT    Equipment Recommendations  Other (comment);Tub/shower bench (RW)    Recommendations for Other Services       Precautions / Restrictions Precautions Precautions: Fall Restrictions Weight Bearing Restrictions: No      Mobility Bed Mobility Overal bed mobility: Needs Assistance Bed Mobility: Supine to Sit     Supine to sit: Min guard;HOB elevated     General bed mobility comments: +rail, increased time    Transfers Overall transfer level: Needs assistance Equipment used: Rolling walker (2 wheeled) Transfers: Sit to/from Stand Sit to Stand: Min guard         General transfer comment: increased time to power up, assist for safety    Balance Overall balance assessment: Mild deficits observed, not formally tested                                          ADL either performed or assessed with clinical judgement   ADL Overall ADL's : Needs assistance/impaired                                       General ADL Comments: Overall, all UB ADL's pt can complete in sitting with minimal set up, LB ADL's requring standing pt will need min gaurd for safety as he is weak and wobbly.     Vision Baseline Vision/History: 0 No visual deficits Ability to See in Adequate Light: 0 Adequate Patient Visual Report: No change from baseline Vision Assessment?: No apparent visual deficits Additional Comments: Visual tracking, double vision simulation, and peripherals intact     Perception Perception Perception Tested?: No   Praxis Praxis Praxis tested?: Within functional limits    Pertinent Vitals/Pain Pain Assessment: No/denies pain     Hand Dominance Right   Extremity/Trunk Assessment Upper Extremity Assessment Upper Extremity Assessment: Overall WFL for tasks assessed   Lower Extremity Assessment Lower Extremity Assessment: Defer to PT evaluation   Cervical / Trunk Assessment Cervical / Trunk Assessment: Kyphotic   Communication Communication Communication: No difficulties   Cognition Arousal/Alertness: Awake/alert Behavior During Therapy: WFL for tasks assessed/performed Overall Cognitive Status: Within Functional Limits for tasks assessed  General Comments  VSS on RA, Son in room and supportive    Exercises     Shoulder Instructions      Home Living Family/patient expects to be discharged to:: Private residence Living Arrangements: Alone Available Help at Discharge: Family;Available 24 hours/day Type of Home: House Home Access: Stairs to enter [employer] of Steps: 3 Entrance Stairs-Rails: Right;Left Home Layout: Two level;Bed/bath upstairs     Bathroom Shower/Tub: Chief Strategy Officer: Standard     Home Equipment: None    Additional Comments: Pt able to stay with his brother in one level house, if needed.      Prior Functioning/Environment Level of Independence: Independent        Comments: Active. Still drives.        OT Problem List: Decreased strength;Decreased activity tolerance;Impaired balance (sitting and/or standing);Decreased coordination;Decreased knowledge of use of DME or AE;Decreased safety awareness      OT Treatment/Interventions: Self-care/ADL training;Therapeutic exercise;Energy conservation;DME and/or AE instruction;Therapeutic activities;Patient/family education;Balance training    OT Goals(Current goals can be found in the care plan section) Acute Rehab OT Goals Patient Stated Goal: home OT Goal Formulation: With patient Time For Goal Achievement: 02/21/21 Potential to Achieve Goals: Good ADL Goals Pt Will Perform Grooming: with modified independence;standing Pt Will Perform Lower Body Bathing: with modified independence;sitting/lateral leans;sit to/from stand Pt Will Perform Lower Body Dressing: with modified independence;sitting/lateral leans;sit to/from stand Pt Will Transfer to Toilet: with modified independence;ambulating Pt Will Perform Toileting - Clothing Manipulation and hygiene: with modified independence;sitting/lateral leans;sit to/from stand  OT Frequency: Min 2X/week   Barriers to D/C:            Co-evaluation              AM-PAC OT "6 Clicks" Daily Activity     Outcome Measure Help from another person eating meals?: None Help from another person taking care of personal grooming?: A Little Help from another person toileting, which includes using toliet, bedpan, or urinal?: A Little Help from another person bathing (including washing, rinsing, drying)?: A Little Help from another person to put on and taking off regular upper body clothing?: A Little Help from another person to put on and taking off regular lower body clothing?: A Little 6 Click  Score: 19   End of Session Equipment Utilized During Treatment: Gait belt;Rolling walker Nurse Communication: Mobility status  Activity Tolerance: Patient tolerated treatment well Patient left: in chair;with call bell/phone within reach;with family/visitor present  OT Visit Diagnosis: Unsteadiness on feet (R26.81);Other abnormalities of gait and mobility (R26.89);Muscle weakness (generalized) (M62.81)                Time: 6153-7943 OT Time Calculation (min): 35 min Charges:  OT General Charges $OT Visit: 1 Visit OT Evaluation $OT Eval Moderate Complexity: 1 Mod OT Treatments $Self Care/Home Management : 8-22 mins  Maggy Wyble H., OTR/L Acute Rehabilitation  Alyx Gee Elane Camira Geidel 02/07/2021, 5:42 PM

## 2021-02-07 NOTE — Evaluation (Signed)
Physical Therapy Evaluation Patient Details Name: Colin Barron MRN: [id number] DOB: 09/08/1945 Today's Date: 02/07/2021  History of Present Illness  Pt is a 75 y.o. male admitted 10/14 for TIA. He presented to ED with c/o dizziness and L headache which had resolved by time of arrival. CT negative. MRI pending. PMH: BPH, recent +covid (Sept 2022).   Clinical Impression  Pt admitted with above diagnosis. PTA pt lived alone, independent with mobility/ADLs without AD. On eval, he required min assist bed mobility, min guard assist transfers, and min guard assist ambulation 150' with RW. BLE intact and symmetrical. Pt with c/o mild dizziness upon initial sit EOB but it resolved quickly. Pt currently with functional limitations due to the deficits listed below (see PT Problem List). Pt will benefit from skilled PT to increase their independence and safety with mobility to allow discharge to the venue listed below.          Recommendations for follow up therapy are one component of a multi-disciplinary discharge planning process, led by the attending physician.  Recommendations may be updated based on patient status, additional functional criteria and insurance authorization.  Follow Up Recommendations Home health PT    Equipment Recommendations  Rolling walker with 5" wheels    Recommendations for Other Services       Precautions / Restrictions Precautions Precautions: Fall      Mobility  Bed Mobility Overal bed mobility: Needs Assistance Bed Mobility: Supine to Sit     Supine to sit: Min assist;HOB elevated     General bed mobility comments: +rail, increased time    Transfers Overall transfer level: Needs assistance Equipment used: Rolling walker (2 wheeled) Transfers: Sit to/from Stand Sit to Stand: Min guard         General transfer comment: increased time to power up, assist for safety  Ambulation/Gait Ambulation/Gait assistance: Min guard Gait Distance  (Feet): 150 Feet Assistive device: Rolling walker (2 wheeled) Gait Pattern/deviations: Step-through pattern;Decreased stride length Gait velocity: decreased Gait velocity interpretation: <1.31 ft/sec, indicative of household ambulator General Gait Details: slow, steady gait with RW  Stairs            Wheelchair Mobility    Modified Rankin (Stroke Patients Only) Modified Rankin (Stroke Patients Only) Pre-Morbid Rankin Score: No symptoms Modified Rankin: Moderate disability     Balance Overall balance assessment: Mild deficits observed, not formally tested                                           Pertinent Vitals/Pain Pain Assessment: No/denies pain    Home Living Family/patient expects to be discharged to:: Private residence Living Arrangements: Alone Available Help at Discharge: Family;Available 24 hours/day Type of Home: House Home Access: Stairs to enter Entrance Stairs-Rails: Doctor, general practice of Steps: 3 Home Layout: Two level;Bed/bath upstairs Home Equipment: None Additional Comments: Pt able to stay with his brother in one level house, if needed.    Prior Function Level of Independence: Independent         Comments: Active. Still drives.     Hand Dominance        Extremity/Trunk Assessment   Upper Extremity Assessment Upper Extremity Assessment: Defer to OT evaluation    Lower Extremity Assessment Lower Extremity Assessment: Overall WFL for tasks assessed (symmetrical)    Cervical / Trunk Assessment Cervical / Trunk Assessment: Kyphotic  Communication  Communication: No difficulties  Cognition Arousal/Alertness: Awake/alert Behavior During Therapy: WFL for tasks assessed/performed Overall Cognitive Status: Within Functional Limits for tasks assessed                                        General Comments General comments (skin integrity, edema, etc.): VSS on RA    Exercises      Assessment/Plan    PT Assessment Patient needs continued PT services  PT Problem List Decreased mobility;Decreased activity tolerance;Decreased knowledge of use of DME;Decreased balance       PT Treatment Interventions DME instruction;Therapeutic activities;Gait training;Therapeutic exercise;Patient/family education;Balance training;Stair training;Functional mobility training    PT Goals (Current goals can be found in the Care Plan section)  Acute Rehab PT Goals Patient Stated Goal: home PT Goal Formulation: With patient Time For Goal Achievement: 02/21/21 Potential to Achieve Goals: Good    Frequency Min 3X/week   Barriers to discharge        Co-evaluation               AM-PAC PT "6 Clicks" Mobility  Outcome Measure Help needed turning from your back to your side while in a flat bed without using bedrails?: None Help needed moving from lying on your back to sitting on the side of a flat bed without using bedrails?: A Little Help needed moving to and from a bed to a chair (including a wheelchair)?: A Little Help needed standing up from a chair using your arms (e.g., wheelchair or bedside chair)?: A Little Help needed to walk in hospital room?: A Little Help needed climbing 3-5 steps with a railing? : A Little 6 Click Score: 19    End of Session Equipment Utilized During Treatment: Gait belt Activity Tolerance: Patient tolerated treatment well Patient left: in chair;with call bell/phone within reach Nurse Communication: Mobility status PT Visit Diagnosis: Difficulty in walking, not elsewhere classified (R26.2)    Time: 8916-9450 PT Time Calculation (min) (ACUTE ONLY): 21 min   Charges:   PT Evaluation $PT Eval Low Complexity: 1 Low          Aida Raider, PT  Office # [phone] Pager [phone]   Ilda Foil 02/07/2021, 10:15 AM

## 2021-02-07 NOTE — Consult Note (Signed)
NEUROLOGY CONSULTATION NOTE   Date of service: February 07, 2021 Patient Name: Colin Barron MRN:  [id number] DOB:  19-Mar-1946 Reason for consult: "episode of dizziness and falling to the right" Requesting Provider: Orland Mustard, MD _ _ _   _ __   _ __ _ _  __ __   _ __   __ _  History of Present Illness  Colin Barron is a 75 y.o. male with PMH significant for BPH, aortic atherosclerosis who presents with headache, head going to Right and vertigo.  Woke up at 0500 on 02/06/21 and as soon as he tilted his head forward, was going to his right. Lasted 5 mins. Went back to sleep. Got up at 0730 and had the samething for 2 mins. Had another episode of this for aminute.  Came to the ED, no significant PMH expect for BPH and arthritis. Does not smoke, no EtOH use, no recreational substances. No prior hx of strokes. No ear fullness, no tinnitis, no ear ache.  CTH w/o C in the ED with no acute abnormality.  mRS: 0 tNK/thrombectomy: not offered, outside window, mild symptoms. NIHSS components Score: Comment  1a Level of Conscious 0[x]  1[]  2[]  3[]      1b LOC Questions 0[x]  1[]  2[]       1c LOC Commands 0[x]  1[]  2[]       2 Best Gaze 0[x]  1[]  2[]       3 Visual 0[x]  1[]  2[]  3[]      4 Facial Palsy 0[x]  1[]  2[]  3[]      5a Motor Arm - left 0[x]  1[]  2[]  3[]  4[]  UN[]    5b Motor Arm - Right 0[x]  1[]  2[]  3[]  4[]  UN[]    6a Motor Leg - Left 0[x]  1[]  2[]  3[]  4[]  UN[]    6b Motor Leg - Right 0[x]  1[]  2[]  3[]  4[]  UN[]    7 Limb Ataxia 0[x]  1[]  2[]  3[]  UN[]     8 Sensory 0[x]  1[]  2[]  UN[]      9 Best Language 0[x]  1[]  2[]  3[]      10 Dysarthria 0[x]  1[]  2[]  UN[]      11 Extinct. and Inattention 0[x]  1[]  2[]       TOTAL: 0      ROS   Constitutional Denies weight loss, fever and chills.   HEENT Denies changes in vision and hearing.   Respiratory Denies SOB and cough.   CV Denies palpitations and CP   GI Denies abdominal pain, nausea, vomiting and diarrhea.   GU Denies dysuria and urinary  frequency.   MSK Denies myalgia and joint pain.   Skin Denies rash and pruritus.   Neurological Denies headache and syncope.   Psychiatric Denies recent changes in mood. Denies anxiety and depression.    Past History   Past Medical History:  Diagnosis Date   Arthritis    BPH (benign prostatic hyperplasia) 12/08/2016   Past Surgical History:  Procedure Laterality Date   BIOPSY PROSTATE     CHOLECYSTECTOMY     CYST REMOVAL NECK     TONSILLECTOMY     Family History  Problem Relation Age of Onset   Diabetes Mother    Hypertension Mother    Intracerebral hemorrhage Brother    Hypertension Brother    Stroke Maternal Grandmother    Stroke Maternal Grandfather    Hypertension Brother        POSS   Social History   Socioeconomic History   Marital status: Divorced    Spouse name: Not on file   Number  of children: 3   Years of education: 12   Highest education level: Bachelor's degree (e.g., BA, AB, BS)  Occupational History   Not on file  Tobacco Use   Smoking status: Never   Smokeless tobacco: Never  Vaping Use   Vaping Use: Never used  Substance and Sexual Activity   Alcohol use: No   Drug use: No   Sexual activity: Yes  Other Topics Concern   Not on file  Social History Narrative   Pt lives with his brother   Social Determinants of Health   Financial Resource Strain: Low Risk    Difficulty of Paying Living Expenses: Not hard at all  Food Insecurity: No Food Insecurity   Worried About Programme researcher, broadcasting/film/video in the Last Year: Never true   Barista in the Last Year: Never true  Transportation Needs: No Transportation Needs   Lack of Transportation (Medical): No   Lack of Transportation (Non-Medical): No  Physical Activity: Insufficiently Active   Days of Exercise per Week: 4 days   Minutes of Exercise per Session: 30 min  Stress: No Stress Concern Present   Feeling of Stress : Not at all  Social Connections: Socially Isolated   Frequency of  Communication with Friends and Family: More than three times a week   Frequency of Social Gatherings with Friends and Family: More than three times a week   Attends Religious Services: Never   Database administrator or Organizations: No   Attends Banker Meetings: Never   Marital Status: Divorced   Allergies  Allergen Reactions   Amoxicillin Other (See Comments)    Did it involve swelling of the face/tongue/throat, SOB, or low BP? No Did it involve sudden or severe rash/hives, skin peeling, or any reaction on the inside of your mouth or nose? No Did you need to seek medical attention at a hospital or doctor's office? No When did it last happen?   Over 10 years ago If all above answers are "NO", may proceed with cephalosporin use.  Brown Spots    Medications   Medications Prior to Admission  Medication Sig Dispense Refill Last Dose   Cholecalciferol (VITAMIN D3 PO) Take by mouth daily.   02/06/2021 at 1000   Polyethyl Glycol-Propyl Glycol (SYSTANE) 0.4-0.3 % SOLN Apply to eye daily.   02/06/2021 at 2200   benzonatate (TESSALON) 100 MG capsule Take 1 capsule (100 mg total) by mouth 2 (two) times daily as needed for cough. 20 capsule 0    Omega-3 Fatty Acids (FISH OIL PO) Take by mouth.   02/04/2021 at 1000     Vitals   Vitals:   02/07/21 0000 02/07/21 0100 02/07/21 0130 02/07/21 0311  BP: 126/78 115/75 124/82 139/79  Pulse: 69 (!) 59 (!) 59 71  Resp: 17 14 15 17   Temp:    97.9 F (36.6 C)  TempSrc:    Oral  SpO2: 96% 97% 97% 100%  Weight:      Height:         Body mass index is 25.94 kg/m.  Physical Exam   General: Laying comfortably in bed; in no acute distress.  HENT: Normal oropharynx and mucosa. Normal external appearance of ears and nose.  Neck: Supple, no pain or tenderness  CV: No JVD. No peripheral edema.  Pulmonary: Symmetric Chest rise. Normal respiratory effort.  Abdomen: Soft to touch, non-tender.  Ext: No cyanosis, edema, or deformity   Skin: No rash. Normal palpation  of skin.   Musculoskeletal: Normal digits and nails by inspection. No clubbing.   Neurologic Examination  Mental status/Cognition: Alert, oriented to self, place, month and year, good attention.  Speech/language: Fluent, comprehension intact, object naming intact, repetition intact.  Cranial nerves:   CN II Pupils equal and reactive to light, no VF deficits    CN III,IV,VI EOM intact, no gaze preference or deviation, no nystagmus    CN V normal sensation in V1, V2, and V3 segments bilaterally    CN VII no asymmetry, no nasolabial fold flattening    CN VIII normal hearing to speech    CN IX & X normal palatal elevation, no uvular deviation    CN XI 5/5 head turn and 5/5 shoulder shrug bilaterally    CN XII midline tongue protrusion    Motor:  Muscle bulk: normal, tone normal, pronator drift none tremor none Mvmt Root Nerve  Muscle Right Left Comments  SA C5/6 Ax Deltoid 5 5   EF C5/6 Mc Biceps 5 5   EE C6/7/8 Rad Triceps 5 4+   WF C6/7 Med FCR     WE C7/8 PIN ECU     F Ab C8/T1 U ADM/FDI 5 4+   HF L1/2/3 Fem Illopsoas 5 5   KE L2/3/4 Fem Quad 5 5   DF L4/5 D Peron Tib Ant 5 5   PF S1/2 Tibial Grc/Sol 5 5    Reflexes:  Right Left Comments  Pectoralis      Biceps (C5/6) 2 2   Brachioradialis (C5/6) 2 2    Triceps (C6/7) 2 2    Patellar (L3/4) 2 2    Achilles (S1)      Hoffman      Plantar     Jaw jerk    Sensation:  Light touch intact   Pin prick    Temperature    Vibration   Proprioception    Coordination/Complex Motor:  - Finger to Nose intact BL - Heel to shin mild LLE ataxia. - Rapid alternating movement are slowed in LUE and LLE - Gait: deferred given LLE ataxia.  Labs   CBC:  Recent Labs  Lab 02/06/21 1404  WBC 5.8  NEUTROABS 4.3  HGB 14.0  HCT 41.7  MCV 92.1  PLT 194    Basic Metabolic Panel:  Lab Results  Component Value Date   NA 138 02/06/2021   K 3.9 02/06/2021   CO2 29 02/06/2021   GLUCOSE 153 (H)  02/06/2021   BUN 9 02/06/2021   CREATININE 0.77 02/06/2021   CALCIUM 10.3 02/06/2021   GFRNONAA >60 02/06/2021   GFRAA 100 12/21/2019   Lipid Panel:  Lab Results  Component Value Date   LDLCALC 89 12/21/2019   HgbA1c: No results found for: HGBA1C Urine Drug Screen:     Component Value Date/Time   LABOPIA NONE DETECTED 02/06/2021 1558   COCAINSCRNUR NONE DETECTED 02/06/2021 1558   LABBENZ NONE DETECTED 02/06/2021 1558   AMPHETMU NONE DETECTED 02/06/2021 1558   THCU NONE DETECTED 02/06/2021 1558   LABBARB NONE DETECTED 02/06/2021 1558    Alcohol Level     Component Value Date/Time   ETH 13 (H) 02/06/2021 1412    CT Head without contrast: Personally reviewed and CTH was negative for a large hypodensity concerning for a large territory infarct or hyperdensity concerning for an ICH  MR Angio head without contrast and Carotid Duplex BL: pending  MRI Brain: pending   Impression   Eddie North Muench  is a 75 y.o. male with PMH significant for BPH, aortic atherosclerosis who presents with headache, episode of head going to Right when he woke up and tilted his head forward with associated vertigo. His neurologic examination is notable for mid LUE weakness and LUE and LLE slowed rapid alternating movements.  Suspect small vessel stroke that is probably causing the left sided symptoms. However, I think the episodic head going to the right upon awakening and tilting his head forwards coule just be BPPV. Althou I could not elicit any when I examined him. Unlikely for stroke to be this episodic.  Primary Diagnosis:  Cerebral infarction, unspecified.  Recommendations  Plan:  Recommend that primary team order following: - Frequent Neuro checks per stroke unit protocol - Recommend brain imaging with MRI Brain without contrast - Recommend Vascular imaging with MRA Angio Head without contrast and US Carotid doppler - Recommend obtaining TTE - Recommend obtaining Lipid panel with  LDL - Please start statin if LDL > 70 - Recommend HbA1c - Antithrombotic - aspirin 81mg  daily. - Recommend DVT ppx - SBP goal - permissive hypertension first 24 h < 220/110. Held home meds.  - Recommend Telemetry monitoring for arrythmia - Recommend bedside swallow screen prior to PO intake. - Stroke education booklet - Recommend PT/OT/SLP consult - stroke team to follow.  Plan discussed with Dr. with the hospitalist team. ______________________________________________________________________   Thank you for the opportunity to take part in the care of this patient. If you have any further questions, please contact the neurology consultation attending.  Signed,  Julian Reil [facility] Pager Number Erick Blinks _ _ _   _ __   _ __ _ _  __ __   _ __   __ _

## 2021-02-07 NOTE — Plan of Care (Signed)
  Problem: Education: Goal: Knowledge of secondary prevention will improve Outcome: Not Progressing Goal: Knowledge of patient specific risk factors addressed and post discharge goals established will improve Outcome: Not Progressing   

## 2021-02-07 NOTE — Plan of Care (Signed)
  Problem: Education: Goal: Knowledge of secondary prevention will improve Outcome: Progressing Goal: Knowledge of patient specific risk factors addressed and post discharge goals established will improve Outcome: Progressing   

## 2021-02-08 DIAGNOSIS — H8111 Benign paroxysmal vertigo, right ear: Secondary | ICD-10-CM

## 2021-02-08 DIAGNOSIS — G459 Transient cerebral ischemic attack, unspecified: Secondary | ICD-10-CM | POA: Diagnosis not present

## 2021-02-08 MED ORDER — ASPIRIN 81 MG PO TBEC: 81.0000 mg | DELAYED_RELEASE_TABLET | Freq: Every day | ORAL | 0 refills | Status: DC

## 2021-02-08 MED ORDER — ATORVASTATIN CALCIUM 40 MG PO TABS: 40.0000 mg | ORAL_TABLET | Freq: Every day | ORAL | 0 refills | Status: DC

## 2021-02-08 NOTE — Progress Notes (Addendum)
STROKE TEAM PROGRESS NOTE   ATTENDING NOTE: I reviewed above note and agree with the assessment and plan. Pt was seen and examined.   75 year old male with history of BPH, aortic stenosis presented to ED for vertigo after getting up from bed x 3.  Patient stated that 2 days ago while he got up from bed, he had vertigo with room spinning and whole body pulling to the right.  He waited for 5 minutes and seem to resolve and he went on for his daily activity.  Later he laid down and when he got up he had again vertigo but seems last as severe at the first time.  This happened 2 times and he came to ER for evaluation.  CT negative for acute abnormality.  CTA head and neck showed distal right MCA and ACA atherosclerosis.  MRI negative for stroke.  EF 55 to 60%.  A1c 4.5, LDL 79, UDS negative.  Creatinine 0.77.  On exam, patient neurologically intact, orientated in no aphasia, no nystagmus or ataxia.  I also did Dix-Hallpike maneuver at bedside, with head turning to the right, lying back down patient had brief episode of dizziness.  Otherwise neuro intact.  Etiology for patient symptoms likely BPPV given related to positional changes each time.  Less likely TIA.  We will continue aspirin 81.  Do not feel statin needed at this time.  Patient need outpatient follow-up with PT for BPPV maneuver training.  Discussed with Dr. Jomarie Longs.  For detailed assessment and plan, please refer to above as I have made changes wherever appropriate.   Neurology will sign off. Please call with questions. Pt will follow up at [facility] in about 4 weeks. Thanks for the consult.  Marvel Plan, MD PhD Stroke Neurology 02/08/2021 7:16 PM    INTERVAL HISTORY No acute event since arrival. Today patient reports he is feeling somewhat improved. He is anxious for discharge.Patient reports understanding of his conversation with Dr. Roda Shutters regarding diagnosis of BPPV and plan of care.   Vitals:   02/07/21 2337 02/08/21 0345 02/08/21 0819  02/08/21 1114  BP: 112/71 (!) 111/54 (!) 109/96 120/77  Pulse: 68 73 81 75  Resp: 15 15 14 15   Temp: 98.1 F (36.7 C) 97.7 F (36.5 C) 97.8 F (36.6 C) 97.7 F (36.5 C)  TempSrc: Oral Oral Oral Oral  SpO2: 96% 97% 100% 98%  Weight:      Height:       CBC:  Recent Labs  Lab 02/06/21 1404  WBC 5.8  NEUTROABS 4.3  HGB 14.0  HCT 41.7  MCV 92.1  PLT 194   Basic Metabolic Panel:  Recent Labs  Lab 02/06/21 1404  NA 138  K 3.9  CL 102  CO2 29  GLUCOSE 153*  BUN 9  CREATININE 0.77  CALCIUM 10.3   Lipid Panel:  Recent Labs  Lab 02/07/21 0525  CHOL 142  TRIG 44  HDL 54  CHOLHDL 2.6  VLDL 9  LDLCALC 79   HgbA1c:  Recent Labs  Lab 02/07/21 0525  HGBA1C 4.5*   Urine Drug Screen:  Recent Labs  Lab 02/06/21 1558  LABOPIA NONE DETECTED  COCAINSCRNUR NONE DETECTED  LABBENZ NONE DETECTED  AMPHETMU NONE DETECTED  THCU NONE DETECTED  LABBARB NONE DETECTED    Alcohol Level  Recent Labs  Lab 02/06/21 1412  ETH 13*    IMAGING past 24 hours No results found.  PHYSICAL EXAM  Temp:  [97.7 F (36.5 C)-98.5 F (36.9 C)] 97.7  F (36.5 C) (10/16 1114) Pulse Rate:  [68-81] 75 (10/16 1114) Resp:  [14-17] 15 (10/16 1114) BP: (109-120)/(54-96) 120/77 (10/16 1114) SpO2:  [96 %-100 %] 98 % (10/16 1114)  General - Well nourished, well developed, in no apparent distress.Marland Kitchen  Ophthalmologic - fundi not visualized due to noncooperation.  Cardiovascular - Regular rhythm and rate.  Mental Status -  Level of arousal and orientation to time, place, and person were intact. Language including expression, naming, repetition, comprehension was assessed and found intact. Attention span and concentration were normal. Recent and remote memory were intact. Fund of Knowledge was assessed and was intact.  Cranial Nerves II - XII - II - Visual field intact OU. III, IV, VI - Extraocular movements intact. V - Facial sensation intact bilaterally VII - Facial movement intact  bilaterally. VIII - Hearing & vestibular intact bilaterally. X - Palate elevates symmetrically. XI - Chin turning & shoulder shrug intact bilaterally. XII - Tongue protrusion intact.  Motor Strength - The patient's strength was normal in all extremities and pronator drift was absent.  Bulk was normal and fasciculations were absent.   Motor Tone - Muscle tone was assessed at the neck and appendages and was normal.  Reflexes - The patient's reflexes were symmetrical in all extremities and he had no pathological reflexes.  Sensory - Light touch, temperature/pinprick were assessed and were symmetrical.    Coordination - The patient had normal movements in the hands and feet with no ataxia or dysmetria.  Tremor was absent.  Gait and Station - deferred.   ASSESSMENT/PLAN Colin Barron is a 75 y.o. male with PMH significant for BPH, aortic atherosclerosis who presents with headache, head going to Right and vertigo.   Woke up at 0500 on 02/06/21 and as soon as he tilted his head forward, was going to his right. Lasted 5 mins. Went back to sleep. Got up at 0730 and had the samething for 2 mins. Had another episode of this for aminute.   Came to the ED, no significant PMH expect for BPH and arthritis. Does not smoke, no EtOH use, no recreational substances. No prior hx of strokes. No ear fullness, no tinnitis, no ear ache.   BPPV Code Stroke CT head  No acute abnormality.  CTA head & neck  Mild calcified atherosclerotic plaque in the bilateral carotid bulbs without hemodynamically significant stenosis or occlusion.Otherwise, patent vasculature of the neck. 2. Multifocal irregularity in the distal right MCA and ACA branches likely reflecting atherosclerotic disease. Mild calcified atherosclerotic plaque of the bilateral cavernous ICAs without significant stenosis or occlusion. Otherwise, patent vasculature of the head. 3. Prominent upper mediastinal lymph nodes measuring up to 1.5 cm.   MRI   No evidence of acute intracranial abnormality, including infarct 2D Echo   1. Left ventricular ejection fraction, by estimation, is 55 to 60%. The left ventricle has normal function. The left ventricle has no regional wall motion abnormalities. Left ventricular diastolic parameters are consistent with Grade I diastolic  dysfunction (impaired relaxation).   2. Right ventricular systolic function is normal. The right ventricular  size is normal.   3. The mitral valve is abnormal. No evidence of mitral valve  regurgitation. No evidence of mitral stenosis.   4. The aortic valve was not well visualized. Aortic valve regurgitation  is trivial. Mild aortic valve sclerosis is present, with no evidence of  aortic valve stenosis.   5. The inferior vena cava is normal in size with greater than 50%  respiratory  variability, suggesting right atrial pressure of 3 mmHg.  The interatrial septum was not well visualized.  LDL 79 HgbA1c 4.5 VTE prophylaxis - SCDs    Diet   Diet Heart Room service appropriate? Yes; Fluid consistency: Thin   Therapy recommendations:  Cleared Disposition:  Home   Hypertension Stable Long-term BP goal normotensive  Hyperlipidemia Home meds:  None  LDL 79, goal < 70   Other Stroke Risk Factors Advanced Age >/= 45  Family hx stroke (Mat grandmother  Other Active Problems   Hospital day # 0 This plan of care was directed by Dr. Roda Shutters.  Janey Genta, NP-C   To contact Stroke Continuity provider, please refer to [email or web address]. After hours, contact General Neurology

## 2021-02-08 NOTE — Care Management Obs Status (Signed)
MEDICARE OBSERVATION STATUS NOTIFICATION   Patient Details  Name: Colin Barron MRN: [id number] Date of Birth: 1945/12/18   Medicare Observation Status Notification Given:  Yes    Bess Kinds, RN 02/08/2021, 11:04 AM

## 2021-02-08 NOTE — TOC Initial Note (Signed)
Transition of Care [facility]) - Initial/Assessment Note    Patient Details  Name: Colin Barron MRN: [id number] Date of Birth: [birth year]  Transition of Care [facility]) CM/SW Contact:    Bess Kinds, RN Phone Number: [phone] 02/08/2021, 11:15 AM  Clinical Narrative:                  Spoke with patient on his mobile device to discuss transition plans. PTA home alone. Stated that he has been providing care to his brother who lives in [place] and stays with his brother half the time. Only DME at home is a raised toilet seat. Patient agreeable to home health. Discussed recommendations for PT and OT. Offered choice of agency. Referral accepted by [facility]. Agreeable to RW, referral to [facility] for delivery to the room. Patient not wanting to get tub bench at this time d/t out of pocket cost. Patient is not sure who will pick him up at discharge, but stated that he has 3 boys. TOC will follow for transportation needs.   Expected Discharge Plan: Home w Home Health Services Barriers to Discharge: Continued Medical Work up   Patient Goals and CMS Choice Patient states their goals for this hospitalization and ongoing recovery are:: return home CMS Medicare.gov Compare Post Acute Care list provided to:: Patient Choice offered to / list presented to : Patient  Expected Discharge Plan and Services Expected Discharge Plan: Home w Home Health Services   Discharge Planning Services: CM Consult Post Acute Care Choice: Home Health, Durable Medical Equipment Living arrangements for the past 2 months: Single Family Home                 DME Arranged: Walker rolling DME Agency: [facility] Date DME Agency Contacted: 02/08/21 Time DME Agency Contacted: [phone] Representative spoke with at DME Agency: Jasmine HH Arranged: PT, OT HH Agency: [facility] Date [facility] Agency Contacted: 02/08/21 Time HH Agency Contacted: 1114 Representative spoke with at [facility] Agency: Stacie  Prior Living  Arrangements/Services Living arrangements for the past 2 months: Single Family Home Lives with:: Self Patient language and need for interpreter reviewed:: Yes Do you feel safe going back to the place where you live?: Yes      Need for Family Participation in Patient Care: Yes (Comment) Care giver support system in place?: Yes (comment) Current home services: DME (raised toilet seat) Criminal Activity/Legal Involvement Pertinent to Current Situation/Hospitalization: No - Comment as needed  Activities of Daily Living Home Assistive Devices/Equipment: Raised toilet seat with rails ADL Screening (condition at time of admission) Patient's cognitive ability adequate to safely complete daily activities?: Yes Is the patient deaf or have difficulty hearing?: No Does the patient have difficulty seeing, even when wearing glasses/contacts?: No Does the patient have difficulty concentrating, remembering, or making decisions?: No Patient able to express need for assistance with ADLs?: Yes Does the patient have difficulty dressing or bathing?: No Independently performs ADLs?: Yes (appropriate for developmental age) Does the patient have difficulty walking or climbing stairs?: No Weakness of Legs: None Weakness of Arms/Hands: None  Permission Sought/Granted                  Emotional Assessment Appearance:: Appears stated age Attitude/Demeanor/Rapport: Engaged Affect (typically observed): Accepting Orientation: : Oriented to Self, Oriented to Place, Oriented to  Time, Oriented to Situation Alcohol / Substance Use: Not Applicable Psych Involvement: No (comment)  Admission diagnosis:  TIA (transient ischemic attack) [G45.9] Patient Active Problem List   Diagnosis Date Noted  Pyuria 02/07/2021   TIA (transient ischemic attack) 02/06/2021   Subconjunctival hemorrhage of right eye 12/21/2019   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 [place] [address] Phone: [phone] Fax: [phone]     Social Determinants of Health (SDOH) Interventions    Readmission Risk Interventions No flowsheet data found.

## 2021-02-08 NOTE — Discharge Summary (Signed)
Physician Discharge Summary  Colin Barron:[id number] DOB: 10-27-1945 DOA: 02/06/2021  PCP: Pcp, No  Admit date: 02/06/2021 Discharge date: 02/08/2021  Time spent: 45 minutes  Recommendations for Outpatient Follow-up:  PCP in 1 week, needs CT chest with contrast Home health PT OT [facility] in 4 to 6 weeks  Discharge Diagnoses:  Principal Problem:   TIA (transient ischemic attack) Active Problems:   Pyuria Mediastinal lymphadenopathy BPH  Discharge Condition: Stable  Diet recommendation: Regular  Filed Weights   02/06/21 1400  Weight: 84.4 kg    History of present illness:  75 year old male with history of BPH presented to the ED yesterday with left-sided headache dizziness and falling to the right side, acute onset of symptoms, seen by neurology in consultation, recommended stroke work-up   Hospital Course:   Headache/L sided dizziness -Improved, patient is a very poor historian, seen by neurology in consultation and recommended TIA/stroke work-up -MRI was negative for CVA, CTA neck was negative for large vessel occlusion, incidentally noted some mediastinal adenopathy -2D echocardiogram was unremarkable as well -LDL was 79, started on aspirin 81 mg daily and atorvastatin -Hemoglobin A1c was 4.5 -Seen by PT OT, home health PT was recommended and set up at discharge  History of BPH -Abnormal urinalysis, asymptomatic, no antibiotics indicated at this time  Mediastinal adenopathy -Incidentally noted on CT neck, recommended CT chest for further evaluation, patient denies smoking history, he was extremely anxious and reluctant to get another CT scan since he already had CT head and CT neck this admission and was concerned about contrast exposure. -I advised CT chest in 2 weeks, this can be arranged by his PCP  Consultations: Neurology Dr.Xu  Discharge Exam: Vitals:   02/08/21 0819 02/08/21 1114  BP: (!) 109/96 120/77  Pulse: 81 75  Resp: 14 15   Temp: 97.8 F (36.6 C) 97.7 F (36.5 C)  SpO2: 100% 98%    General:AAOx3 Cardiovascular: S1S2/RRR Respiratory: CTAB  Discharge Instructions   Discharge Instructions     Diet - low sodium heart healthy   Complete by: As directed    Increase activity slowly   Complete by: As directed       Allergies as of 02/08/2021       Reactions   Amoxicillin Other (See Comments)   Did it involve swelling of the face/tongue/throat, SOB, or low BP? No Did it involve sudden or severe rash/hives, skin peeling, or any reaction on the inside of your mouth or nose? No Did you need to seek medical attention at a hospital or doctor's office? No When did it last happen?   Over 10 years ago If all above answers are "NO", may proceed with cephalosporin use. Brown Spots        Medication List     TAKE these medications    aspirin 81 MG EC tablet Take 1 tablet (81 mg total) by mouth daily. Swallow whole. Start taking on: February 09, 2021   atorvastatin 40 MG tablet Commonly known as: LIPITOR Take 1 tablet (40 mg total) by mouth at bedtime.   benzonatate 100 MG capsule Commonly known as: TESSALON Take 1 capsule (100 mg total) by mouth 2 (two) times daily as needed for cough.   FISH OIL PO Take by mouth.   Systane 0.4-0.3 % Soln Generic drug: Polyethyl Glycol-Propyl Glycol Apply to eye daily.   VITAMIN D3 PO Take by mouth daily.               Durable  Medical Equipment  (From admission, onward)           Start     Ordered   02/08/21 1109  For home use only DME Walker rolling  Once       Question Answer Comment  Walker: With 5 Inch Wheels   Patient needs a walker to treat with the following condition Decreased functional mobility and endurance      02/08/21 1108           Allergies  Allergen Reactions   Amoxicillin Other (See Comments)    Did it involve swelling of the face/tongue/throat, SOB, or low BP? No Did it involve sudden or severe rash/hives,  skin peeling, or any reaction on the inside of your mouth or nose? No Did you need to seek medical attention at a hospital or doctor's office? No When did it last happen?   Over 10 years ago If all above answers are "NO", may proceed with cephalosporin use.  Manson Passey Spots    Follow-up Information     [facility].   Specialty: Home Health Services Why: the office will call to schedule home health visits. Please allow 48 hours for follow up call. Contact information: [address] [place] [address] [phone]         PCP. Schedule an appointment as soon as possible for a visit in 1 week(s).   Why: You need a CT Chest in 2weeks to assess the enlarged Lymph node seen in the neck                 The results of significant diagnostics from this hospitalization (including imaging, microbiology, ancillary and laboratory) are listed below for reference.    Significant Diagnostic Studies: CT ANGIO HEAD NECK W WO CM  Result Date: 02/07/2021 CLINICAL DATA:  Left-sided headache and dizziness, stroke workup EXAM: CT ANGIOGRAPHY HEAD AND NECK TECHNIQUE: Multidetector CT imaging of the head and neck was performed using the standard protocol during bolus administration of intravenous contrast. Multiplanar CT image reconstructions and MIPs were obtained to evaluate the vascular anatomy. Carotid stenosis measurements (when applicable) are obtained utilizing NASCET criteria, using the distal internal carotid diameter as the denominator. CONTRAST:  72mL OMNIPAQUE IOHEXOL 350 MG/ML SOLN COMPARISON:  Noncontrast CT head obtained 1 day prior FINDINGS: CTA NECK FINDINGS Aortic arch: Standard branching. Imaged portion shows no evidence of aneurysm or dissection. No significant stenosis of the major arch vessel origins. Right carotid system: There is mild calcified atherosclerotic plaque in the proximal right internal carotid artery without hemodynamically significant  stenosis or occlusion. There is no dissection or aneurysm. Left carotid system: There is mild calcified atherosclerotic plaque in the proximal left internal carotid artery without hemodynamically significant stenosis or occlusion. There is no dissection or aneurysm. Vertebral arteries: Codominant. No evidence of dissection, stenosis (50% or greater) or occlusion. Skeleton: The a Ng act 2 there is mild multilevel degenerative change of the cervical spine. There is no acute osseous abnormality or aggressive osseous lesion. Other neck: The soft tissues are unremarkable. Upper chest: There are prominent upper mediastinal lymph nodes measuring up to 1.5 cm. The lung apices appear clear. Review of the MIP images confirms the above findings CTA HEAD FINDINGS Anterior circulation: There is mild calcification of the bilateral cavernous ICAs without hemodynamically significant stenosis or occlusion. The bilateral MCAs are patent. There is multifocal irregularity of the distal right MCA branches likely reflecting atherosclerotic disease. The bilateral ACAs are  patent with multifocal irregularity and narrowing of the right A2 segment. There is no occlusion. There is no aneurysm. Posterior circulation: The bilateral V4 segments are patent. The basilar artery is patent. There is a fetal PCA on the right.  The bilateral PCAs are patent. There is no aneurysm. Venous sinuses: As permitted by contrast timing, patent. Anatomic variants: As above. Review of the MIP images confirms the above findings IMPRESSION: 1. Mild calcified atherosclerotic plaque in the bilateral carotid bulbs without hemodynamically significant stenosis or occlusion. Otherwise, patent vasculature of the neck. 2. Multifocal irregularity in the distal right MCA and ACA branches likely reflecting atherosclerotic disease. Mild calcified atherosclerotic plaque of the bilateral cavernous ICAs without significant stenosis or occlusion. Otherwise, patent vasculature of  the head. 3. Prominent upper mediastinal lymph nodes measuring up to 1.5 cm. Consider dedicated imaging of the chest for better evaluation. Electronically Signed   By: Lesia Hausen M.D.   On: 02/07/2021 13:31   CT Head Wo Contrast  Result Date: 02/06/2021 CLINICAL DATA:  Left-sided headache, dizziness EXAM: CT HEAD WITHOUT CONTRAST TECHNIQUE: Contiguous axial images were obtained from the base of the skull through the vertex without intravenous contrast. COMPARISON:  None. FINDINGS: Brain: No evidence of acute infarction, hemorrhage, hydrocephalus, extra-axial collection or mass lesion/mass effect. Mild age related cerebral volume loss. Vascular: No hyperdense vessel or unexpected calcification. Skull: Normal. Negative for fracture or focal lesion. Sinuses/Orbits: No acute finding. Other: None. IMPRESSION: No acute intracranial findings. Electronically Signed   By: Duanne Guess D.O.   On: 02/06/2021 15:01   MR BRAIN WO CONTRAST  Result Date: 02/07/2021 CLINICAL DATA:  Transient ischemic attack (TIA) EXAM: MRI HEAD WITHOUT CONTRAST TECHNIQUE: Multiplanar, multiecho pulse sequences of the brain and surrounding structures were obtained without intravenous contrast. COMPARISON:  Same day CTA and CT head from yesterday. FINDINGS: Brain: No acute infarction, acute hemorrhage, hydrocephalus, extra-axial collection or mass lesion. Mild generalized atrophy. Mild patchy white matter T2 hyperintensities, nonspecific but compatible with chronic microvascular ischemic disease that is very mild for age. Focus of susceptibility artifact in the left parietal periventricular white matter without edema, likely the sequela prior microhemorrhage. Vascular: Major arterial flow voids are maintained at the skull base. Skull and upper cervical spine: Normal marrow signal. Sinuses/Orbits: Mild paranasal sinus mucosal thickening. Unremarkable orbits. Other: No mastoid effusions. IMPRESSION: No evidence of acute intracranial  abnormality, including infarct. Electronically Signed   By: Feliberto Harts M.D.   On: 02/07/2021 14:45   ECHOCARDIOGRAM COMPLETE  Result Date: 02/07/2021    ECHOCARDIOGRAM REPORT   Patient Name:   Colin Barron Date of Exam: 02/07/2021 Medical Rec #:  [id number]         Height:       71.0 in Accession #:    [id number]        Weight:       186.0 lb Date of Birth:  [birth year]        BSA:          2.045 m Patient Age:    74 years          BP:           130/74 mmHg Patient Gender: M                 HR:           71 bpm. Exam Location:  Inpatient Procedure: 2D Echo, Cardiac Doppler and Color Doppler Indications:    TIA G45.9  History:  Patient has no prior history of Echocardiogram examinations.  Sonographer:    Roosvelt Maser RDCS Referring Phys: [phone] JARED M GARDNER IMPRESSIONS  1. Left ventricular ejection fraction, by estimation, is 55 to 60%. The left ventricle has normal function. The left ventricle has no regional wall motion abnormalities. Left ventricular diastolic parameters are consistent with Grade I diastolic dysfunction (impaired relaxation).  2. Right ventricular systolic function is normal. The right ventricular size is normal.  3. The mitral valve is abnormal. No evidence of mitral valve regurgitation. No evidence of mitral stenosis.  4. The aortic valve was not well visualized. Aortic valve regurgitation is trivial. Mild aortic valve sclerosis is present, with no evidence of aortic valve stenosis.  5. The inferior vena cava is normal in size with greater than 50% respiratory variability, suggesting right atrial pressure of 3 mmHg. FINDINGS  Left Ventricle: Left ventricular ejection fraction, by estimation, is 55 to 60%. The left ventricle has normal function. The left ventricle has no regional wall motion abnormalities. The left ventricular internal cavity size was normal in size. There is  no left ventricular hypertrophy. Left ventricular diastolic parameters are consistent with Grade I  diastolic dysfunction (impaired relaxation). Right Ventricle: The right ventricular size is normal. No increase in right ventricular wall thickness. Right ventricular systolic function is normal. Left Atrium: Left atrial size was normal in size. Right Atrium: Right atrial size was normal in size. Pericardium: There is no evidence of pericardial effusion. Mitral Valve: The mitral valve is abnormal. There is mild thickening of the mitral valve leaflet(s). There is mild calcification of the mitral valve leaflet(s). Mild mitral annular calcification. No evidence of mitral valve regurgitation. No evidence of mitral valve stenosis. Tricuspid Valve: The tricuspid valve is normal in structure. Tricuspid valve regurgitation is trivial. No evidence of tricuspid stenosis. Aortic Valve: The aortic valve was not well visualized. Aortic valve regurgitation is trivial. Mild aortic valve sclerosis is present, with no evidence of aortic valve stenosis. Aortic valve mean gradient measures 2.0 mmHg. Aortic valve peak gradient measures 3.3 mmHg. Aortic valve area, by VTI measures 3.66 cm. Pulmonic Valve: The pulmonic valve was normal in structure. Pulmonic valve regurgitation is not visualized. No evidence of pulmonic stenosis. Aorta: The aortic root is normal in size and structure. Venous: The inferior vena cava is normal in size with greater than 50% respiratory variability, suggesting right atrial pressure of 3 mmHg. IAS/Shunts: The interatrial septum was not well visualized.  LEFT VENTRICLE PLAX 2D LVIDd:         4.20 cm   Diastology LVIDs:         2.80 cm   LV e' medial:    5.66 cm/s LV PW:         0.90 cm   LV E/e' medial:  9.8 LV IVS:        1.00 cm   LV e' lateral:   8.70 cm/s LVOT diam:     2.30 cm   LV E/e' lateral: 6.4 LV SV:         64 LV SV Index:   31 LVOT Area:     4.15 cm  RIGHT VENTRICLE RV Basal diam:  2.40 cm LEFT ATRIUM             Index        RIGHT ATRIUM           Index LA diam:        3.60 cm 1.76 cm/m   RA  Area:     14.20 cm LA Vol (A2C):   57.0 ml 27.88 ml/m  RA Volume:   31.50 ml  15.41 ml/m LA Vol (A4C):   52.7 ml 25.78 ml/m LA Biplane Vol: 56.6 ml 27.68 ml/m  AORTIC VALVE AV Area (Vmax):    4.25 cm AV Area (Vmean):   3.88 cm AV Area (VTI):     3.66 cm AV Vmax:           90.90 cm/s AV Vmean:          59.700 cm/s AV VTI:            0.175 m AV Peak Grad:      3.3 mmHg AV Mean Grad:      2.0 mmHg LVOT Vmax:         93.00 cm/s LVOT Vmean:        55.700 cm/s LVOT VTI:          0.154 m LVOT/AV VTI ratio: 0.88  AORTA Ao Root diam: 3.20 cm Ao Asc diam:  3.10 cm MITRAL VALVE MV Area (PHT): 2.93 cm    SHUNTS MV Decel Time: 259 msec    Systemic VTI:  0.15 m MV E velocity: 55.70 cm/s  Systemic Diam: 2.30 cm MV A velocity: 85.70 cm/s MV E/A ratio:  0.65 Charlton Haws MD Electronically signed by Charlton Haws MD Signature Date/Time: 02/07/2021/12:15:37 PM    Final     Microbiology: Recent Results (from the past 240 hour(s))  Resp Panel by RT-PCR (Flu A&B, Covid) Nasopharyngeal Swab     Status: None   Collection Time: 02/06/21  2:19 PM   Specimen: Nasopharyngeal Swab; Nasopharyngeal(NP) swabs in vial transport medium  Result Value Ref Range Status   SARS Coronavirus 2 by RT PCR NEGATIVE NEGATIVE Final    Comment: (NOTE) SARS-CoV-2 target nucleic acids are NOT DETECTED.  The SARS-CoV-2 RNA is generally detectable in upper respiratory specimens during the acute phase of infection. The lowest concentration of SARS-CoV-2 viral copies this assay can detect is 138 copies/mL. A negative result does not preclude SARS-Cov-2 infection and should not be used as the sole basis for treatment or other patient management decisions. A negative result may occur with  improper specimen collection/handling, submission of specimen other than nasopharyngeal swab, presence of viral mutation(s) within the areas targeted by this assay, and inadequate number of viral copies(<138 copies/mL). A negative result must be combined  with clinical observations, patient history, and epidemiological information. The expected result is Negative.  Fact Sheet for Patients:  BloggerCourse.com  Fact Sheet for Healthcare Providers:  SeriousBroker.it  This test is no t yet approved or cleared by the [place] FDA and  has been authorized for detection and/or diagnosis of SARS-CoV-2 by FDA under an Emergency Use Authorization (EUA). This EUA will remain  in effect (meaning this test can be used) for the duration of the COVID-19 declaration under Section 564(b)(1) of the Act, 21 U.S.C.section 360bbb-3(b)(1), unless the authorization is terminated  or revoked sooner.       Influenza A by PCR NEGATIVE NEGATIVE Final   Influenza B by PCR NEGATIVE NEGATIVE Final    Comment: (NOTE) The Xpert Xpress SARS-CoV-2/FLU/RSV plus assay is intended as an aid in the diagnosis of influenza from Nasopharyngeal swab specimens and should not be used as a sole basis for treatment. Nasal washings and aspirates are unacceptable for Xpert Xpress SARS-CoV-2/FLU/RSV testing.  Fact Sheet for Patients: BloggerCourse.com  Fact Sheet for Healthcare Providers: SeriousBroker.it  This test is not yet approved or cleared by the [place] and has been authorized for detection and/or diagnosis of SARS-CoV-2 by FDA under an Emergency Use Authorization (EUA). This EUA will remain in effect (meaning this test can be used) for the duration of the COVID-19 declaration under Section 564(b)(1) of the Act, 21 U.S.C. section 360bbb-3(b)(1), unless the authorization is terminated or revoked.  Performed at [facility], [address], [place], [place] [address]      Labs: Basic Metabolic Panel: Recent Labs  Lab 02/06/21 1404  NA 138  K 3.9  CL 102  CO2 29  GLUCOSE 153*  BUN 9  CREATININE 0.77  CALCIUM 10.3    Liver Function Tests: Recent Labs  Lab 02/06/21 1404  AST 17  ALT 11  ALKPHOS 67  BILITOT 1.0  PROT 7.8  ALBUMIN 4.3   No results for input(s): LIPASE, AMYLASE in the last 168 hours. No results for input(s): AMMONIA in the last 168 hours. CBC: Recent Labs  Lab 02/06/21 1404  WBC 5.8  NEUTROABS 4.3  HGB 14.0  HCT 41.7  MCV 92.1  PLT 194   Cardiac Enzymes: No results for input(s): CKTOTAL, CKMB, CKMBINDEX, TROPONINI in the last 168 hours. BNP: BNP (last 3 results) No results for input(s): BNP in the last 8760 hours.  ProBNP (last 3 results) No results for input(s): PROBNP in the last 8760 hours.  CBG: Recent Labs  Lab 02/06/21 1407  GLUCAP 146*       Signed:  Zannie Cove MD.  [facility] 02/08/2021, 2:17 PM

## 2021-02-08 NOTE — Progress Notes (Signed)
  Discharge instructions discussed with patient and family at bedside. IV removed, Tele discontinued. All questions answered.  Pt transported off unit wheelchair to be driven home by family.Belongings returned  Melony Overly, RN

## 2021-02-11 ENCOUNTER — Ambulatory Visit: Payer: Medicare PPO | Admitting: Family Medicine

## 2021-02-11 ENCOUNTER — Inpatient Hospital Stay: Payer: Medicare PPO | Admitting: Family Medicine

## 2021-02-11 ENCOUNTER — Other Ambulatory Visit: Payer: Self-pay

## 2021-02-11 ENCOUNTER — Encounter: Payer: Self-pay | Admitting: Family Medicine

## 2021-02-11 VITALS — BP 104/68 | HR 73 | Temp 98.3°F | Resp 16 | Ht 71.0 in | Wt 173.4 lb

## 2021-02-11 DIAGNOSIS — R8281 Pyuria: Secondary | ICD-10-CM

## 2021-02-11 DIAGNOSIS — G459 Transient cerebral ischemic attack, unspecified: Secondary | ICD-10-CM

## 2021-02-11 DIAGNOSIS — R590 Localized enlarged lymph nodes: Secondary | ICD-10-CM | POA: Insufficient documentation

## 2021-02-11 LAB — POCT URINALYSIS DIPSTICK
Bilirubin, UA: NEGATIVE
Blood, UA: NEGATIVE
Glucose, UA: NEGATIVE
Ketones, UA: NEGATIVE
Nitrite, UA: POSITIVE
Odor: NORMAL
Protein, UA: NEGATIVE
Spec Grav, UA: 1.025 (ref 1.010–1.025)
Urobilinogen, UA: 0.2 E.U./dL
pH, UA: 6.5 (ref 5.0–8.0)

## 2021-02-11 NOTE — Assessment & Plan Note (Signed)
Seen incidentally on CTA neck. Will follow up with CT chest w contrast. Concerning given h/o night sweats, unintentional weight loss. If f/u CT reassuring, recommend further lab w/u for unintentional weight loss.

## 2021-02-11 NOTE — Assessment & Plan Note (Addendum)
No dysuria. With patient concern for odor, will repeat UA and obtain urine culture. Maintain f/u with Urology. Recommend adequate oral hydration.

## 2021-02-11 NOTE — Progress Notes (Signed)
   SUBJECTIVE:   CHIEF COMPLAINT / HPI:   HOSPITAL FOLLOW UP Hospital/facility: [facility] 10/14-10/16 Diagnosis: TIA vs BPPV Procedures/tests:  - MRI - negative for stroke - CTA neck negative for large vessel occlusion. Incidental mediastinal adenopathy, recommend Ct chest. - ECHO EF 55-60%, no WMA Consultants: Neurology New medications:  - ASA Discharge instructions:   - home health PT, OT. Has appointment on Monday. - neuro f/u 4-6 wks Status: better - no SOB, fevers, night sweats - has lost some weight, 15lb over the past year. Unintentional. - still getting sensation of room spinning sx with sudden movements of head.  - had COVID about 6 weeks ago - no pain with urination, some odor to urine.  - never smoker.   OBJECTIVE:   BP 104/68   Pulse 73   Temp 98.3 F (36.8 C)   Resp 16   Ht 5\' 11"  (1.803 m)   Wt 173 lb 6.4 oz (78.7 kg)   SpO2 100%   BMI 24.18 kg/m   Gen: well appearing, in NAD Card: RRR Lungs: CTAB Ext: WWP, no edema Neuro: ambulates with walker. EOMI intact. without nystagmus but with reproduction of symptoms.   ASSESSMENT/PLAN:   Pyuria No dysuria. With patient concern for odor, will repeat UA and obtain urine culture. Maintain f/u with Urology. Recommend adequate oral hydration.  Mediastinal adenopathy Seen incidentally on CTA neck. Will follow up with CT chest w contrast. Concerning given h/o night sweats, unintentional weight loss. If f/u CT reassuring, recommend further lab w/u for unintentional weight loss.   Hospital follow up Initial presentation TIA vs. BPPV, likely more consistent with BPPV given symptoms and reassuring imaging. Gilberto Better with reproduction of symptoms on exam today though without nystagmus. Has f/u with PT on Monday, recommend Epley maneuver.    Tuesday, DO

## 2021-02-11 NOTE — Patient Instructions (Addendum)
It was great to see you!  Our plans for today:  - Someone will call you about an appointment for the CT scan. - We are checking your urine labs today, we will release these results to your MyChart. - Keep drinking lots of water.  - Follow up with Physical therapy and Neurology.  - Follow up in 1-2 months.   Take care and seek immediate care sooner if you develop any concerns.   Dr. Linwood Dibbles

## 2021-02-13 ENCOUNTER — Other Ambulatory Visit: Payer: Self-pay

## 2021-02-13 LAB — URINE CULTURE
MICRO NUMBER:: 12524085
SPECIMEN QUALITY:: ADEQUATE

## 2021-02-13 MED ORDER — NITROFURANTOIN MONOHYD MACRO 100 MG PO CAPS: 100.0000 mg | ORAL_CAPSULE | Freq: Two times a day (BID) | ORAL | 0 refills | Status: DC

## 2021-02-13 MED ORDER — NITROFURANTOIN MONOHYD MACRO 100 MG PO CAPS: 100.0000 mg | ORAL_CAPSULE | Freq: Two times a day (BID) | ORAL | 0 refills | Status: AC

## 2021-02-13 NOTE — Addendum Note (Signed)
Addended by: Caro Laroche on: 02/13/2021 11:03 AM   Modules accepted: Orders

## 2021-02-16 ENCOUNTER — Telehealth: Payer: Self-pay | Admitting: Nurse Practitioner

## 2021-02-16 NOTE — Telephone Encounter (Signed)
Home Health Verbal Orders - Caller/Agency: [facility] Callback Number: [phone] Requesting PT Frequency: 1 wk 9  Ok to leave message

## 2021-02-18 ENCOUNTER — Telehealth: Payer: Self-pay

## 2021-02-18 NOTE — Telephone Encounter (Signed)
Copied from CRM [id number]. Topic: General - Call Back - No Documentation >> Feb 18, 2021 11:18 AM Randol Kern wrote: Reason for CRM: Pt wants a call back from referrals coordinator, has questions  Best contact: [phone]

## 2021-02-18 NOTE — Telephone Encounter (Signed)
I called this patient back to see how I can be of assistance to him, but there was no answer. A message was left for him to give me a call back at his convenience.

## 2021-02-26 ENCOUNTER — Ambulatory Visit (INDEPENDENT_AMBULATORY_CARE_PROVIDER_SITE_OTHER): Payer: Medicare PPO

## 2021-02-26 ENCOUNTER — Telehealth: Payer: Self-pay | Admitting: Nurse Practitioner

## 2021-02-26 ENCOUNTER — Other Ambulatory Visit: Payer: Self-pay

## 2021-02-26 VITALS — BP 128/78 | HR 80 | Temp 97.8°F | Ht 71.0 in | Wt 176.1 lb

## 2021-02-26 DIAGNOSIS — Z0000 Encounter for general adult medical examination without abnormal findings: Secondary | ICD-10-CM | POA: Diagnosis not present

## 2021-02-26 NOTE — Telephone Encounter (Signed)
Copied from CRM [id number]. Topic: Quick Communication - Home Health Verbal Orders >> Feb 26, 2021  2:35 PM Marylen Ponto wrote: Caller/AgencyLeatha Gilding with [facility] Callback Number: [phone] Requesting OT/PT/Skilled Nursing/Social Work/Speech Therapy: OT Frequency: 1 time a week for 5 weeks  Mallory also requests order for social worker

## 2021-02-26 NOTE — Patient Instructions (Signed)
Mr. Colin Barron , Thank you for taking time to come for your Medicare Wellness Visit. I appreciate your ongoing commitment to your health goals. Please review the following plan we discussed and let me know if I can assist you in the future.   Screening recommendations/referrals: Colonoscopy: no longer required Recommended yearly ophthalmology/optometry visit for glaucoma screening and checkup Recommended yearly dental visit for hygiene and checkup  Vaccinations: Influenza vaccine: declined Pneumococcal vaccine: declined Tdap vaccine: due Shingles vaccine: Shingrix discussed. Please contact your pharmacy for coverage information.  Covid-19: done 06/04/19 & 07/04/19; please bring a copy of your vaccine record to your next appointment  Advanced directives: Please bring a copy of your health care power of attorney and living will to the office at your convenience.   Conditions/risks identified: recommend continuing fall prevention in the home  Next appointment: Follow up in one year for your annual wellness visit.   Preventive Care 76 Years and Older, Male Preventive care refers to lifestyle choices and visits with your health care provider that can promote health and wellness. What does preventive care include? A yearly physical exam. This is also called an annual well check. Dental exams once or twice a year. Routine eye exams. Ask your health care provider how often you should have your eyes checked. Personal lifestyle choices, including: Daily care of your teeth and gums. Regular physical activity. Eating a healthy diet. Avoiding tobacco and drug use. Limiting alcohol use. Practicing safe sex. Taking low doses of aspirin every day. Taking vitamin and mineral supplements as recommended by your health care provider. What happens during an annual well check? The services and screenings done by your health care provider during your annual well check will depend on your age, overall  health, lifestyle risk factors, and family history of disease. Counseling  Your health care provider may ask you questions about your: Alcohol use. Tobacco use. Drug use. Emotional well-being. Home and relationship well-being. Sexual activity. Eating habits. History of falls. Memory and ability to understand (cognition). Work and work Astronomer. Screening  You may have the following tests or measurements: Height, weight, and BMI. Blood pressure. Lipid and cholesterol levels. These may be checked every 5 years, or more frequently if you are over 40 years old. Skin check. Lung cancer screening. You may have this screening every year starting at age 68 if you have a 30-pack-year history of smoking and currently smoke or have quit within the past 15 years. Fecal occult blood test (FOBT) of the stool. You may have this test every year starting at age 21. Flexible sigmoidoscopy or colonoscopy. You may have a sigmoidoscopy every 5 years or a colonoscopy every 10 years starting at age 28. Prostate cancer screening. Recommendations will vary depending on your family history and other risks. Hepatitis C blood test. Hepatitis B blood test. Sexually transmitted disease (STD) testing. Diabetes screening. This is done by checking your blood sugar (glucose) after you have not eaten for a while (fasting). You may have this done every 1-3 years. Abdominal aortic aneurysm (AAA) screening. You may need this if you are a current or former smoker. Osteoporosis. You may be screened starting at age 53 if you are at high risk. Talk with your health care provider about your test results, treatment options, and if necessary, the need for more tests. Vaccines  Your health care provider may recommend certain vaccines, such as: Influenza vaccine. This is recommended every year. Tetanus, diphtheria, and acellular pertussis (Tdap, Td) vaccine. You may need  a Td booster every 10 years. Zoster vaccine. You may  need this after age 37. Pneumococcal 13-valent conjugate (PCV13) vaccine. One dose is recommended after age 103. Pneumococcal polysaccharide (PPSV23) vaccine. One dose is recommended after age 8. Talk to your health care provider about which screenings and vaccines you need and how often you need them. This information is not intended to replace advice given to you by your health care provider. Make sure you discuss any questions you have with your health care provider. Document Released: 05/09/2015 Document Revised: 12/31/2015 Document Reviewed: 02/11/2015 Elsevier Interactive Patient Education  2017 [place] Prevention in the Home Falls can cause injuries. They can happen to people of all ages. There are many things you can do to make your home safe and to help prevent falls. What can I do on the outside of my home? Regularly fix the edges of walkways and driveways and fix any cracks. Remove anything that might make you trip as you walk through a door, such as a raised step or threshold. Trim any bushes or trees on the path to your home. Use bright outdoor lighting. Clear any walking paths of anything that might make someone trip, such as rocks or tools. Regularly check to see if handrails are loose or broken. Make sure that both sides of any steps have handrails. Any raised decks and porches should have guardrails on the edges. Have any leaves, snow, or ice cleared regularly. Use sand or salt on walking paths during winter. Clean up any spills in your garage right away. This includes oil or grease spills. What can I do in the bathroom? Use night lights. Install grab bars by the toilet and in the tub and shower. Do not use towel bars as grab bars. Use non-skid mats or decals in the tub or shower. If you need to sit down in the shower, use a plastic, non-slip stool. Keep the floor dry. Clean up any water that spills on the floor as soon as it happens. Remove soap buildup in  the tub or shower regularly. Attach bath mats securely with double-sided non-slip rug tape. Do not have throw rugs and other things on the floor that can make you trip. What can I do in the bedroom? Use night lights. Make sure that you have a light by your bed that is easy to reach. Do not use any sheets or blankets that are too big for your bed. They should not hang down onto the floor. Have a firm chair that has side arms. You can use this for support while you get dressed. Do not have throw rugs and other things on the floor that can make you trip. What can I do in the kitchen? Clean up any spills right away. Avoid walking on wet floors. Keep items that you use a lot in easy-to-reach places. If you need to reach something above you, use a strong step stool that has a grab bar. Keep electrical cords out of the way. Do not use floor polish or wax that makes floors slippery. If you must use wax, use non-skid floor wax. Do not have throw rugs and other things on the floor that can make you trip. What can I do with my stairs? Do not leave any items on the stairs. Make sure that there are handrails on both sides of the stairs and use them. Fix handrails that are broken or loose. Make sure that handrails are as long as the stairways. Check  any carpeting to make sure that it is firmly attached to the stairs. Fix any carpet that is loose or worn. Avoid having throw rugs at the top or bottom of the stairs. If you do have throw rugs, attach them to the floor with carpet tape. Make sure that you have a light switch at the top of the stairs and the bottom of the stairs. If you do not have them, ask someone to add them for you. What else can I do to help prevent falls? Wear shoes that: Do not have high heels. Have rubber bottoms. Are comfortable and fit you well. Are closed at the toe. Do not wear sandals. If you use a stepladder: Make sure that it is fully opened. Do not climb a closed  stepladder. Make sure that both sides of the stepladder are locked into place. Ask someone to hold it for you, if possible. Clearly mark and make sure that you can see: Any grab bars or handrails. First and last steps. Where the edge of each step is. Use tools that help you move around (mobility aids) if they are needed. These include: Canes. Walkers. Scooters. Crutches. Turn on the lights when you go into a dark area. Replace any light bulbs as soon as they burn out. Set up your furniture so you have a clear path. Avoid moving your furniture around. If any of your floors are uneven, fix them. If there are any pets around you, be aware of where they are. Review your medicines with your doctor. Some medicines can make you feel dizzy. This can increase your chance of falling. Ask your doctor what other things that you can do to help prevent falls. This information is not intended to replace advice given to you by your health care provider. Make sure you discuss any questions you have with your health care provider. Document Released: 02/06/2009 Document Revised: 09/18/2015 Document Reviewed: 05/17/2014 Elsevier Interactive Patient Education  2017 [employer].

## 2021-02-26 NOTE — Progress Notes (Signed)
Subjective:   Colin Barron is a 75 y.o. male who presents for Medicare Annual/Subsequent preventive examination.  Review of Systems       Cardiac Risk Factors include: advanced age (>37men, >41 women);male gender     Objective:    Today's Vitals   02/26/21 1044 02/26/21 1051  BP: 128/78   Pulse: 80   Temp: 97.8 F (36.6 C)   SpO2: 99%   Weight: 176 lb 1.6 oz (79.9 kg)   Height: 5\' 11"  (1.803 m)   PainSc:  4    Body mass index is 24.56 kg/m.  Advanced Directives 02/26/2021 02/07/2021 02/06/2021 02/26/2020 02/20/2019 10/27/2018 10/03/2018  Does Patient Have a Medical Advance Directive? Yes No;Yes No No No No No  Type of Advance Directive Living will;Healthcare Power of [employer] Power of Attorney - - - - -  Does patient want to make changes to medical advance directive? - No - Patient declined - - - - -  Copy of Healthcare Power of Attorney in Chart? No - copy requested No - copy requested - - - - -  Would patient like information on creating a medical advance directive? - No - Patient declined No - Patient declined No - Patient declined No - Patient declined No - Patient declined -    Current Medications (verified) Outpatient Encounter Medications as of 02/26/2021  Medication Sig   aspirin EC 81 MG EC tablet Take 1 tablet (81 mg total) by mouth daily. Swallow whole.   Cholecalciferol (VITAMIN D3 PO) Take by mouth daily.   Polyethyl Glycol-Propyl Glycol (SYSTANE) 0.4-0.3 % SOLN Apply to eye daily.   [DISCONTINUED] benzonatate (TESSALON) 100 MG capsule Take 1 capsule (100 mg total) by mouth 2 (two) times daily as needed for cough.   [DISCONTINUED] Omega-3 Fatty Acids (FISH OIL PO) Take by mouth.   No facility-administered encounter medications on file as of 02/26/2021.    Allergies (verified) Amoxicillin   History: Past Medical History:  Diagnosis Date   Arthritis    BPH (benign prostatic hyperplasia) 12/08/2016   Past Surgical History:  Procedure  Laterality Date   BIOPSY PROSTATE     CHOLECYSTECTOMY     CYST REMOVAL NECK     TONSILLECTOMY     Family History  Problem Relation Age of Onset   Diabetes Mother    Hypertension Mother    Intracerebral hemorrhage Brother    Hypertension Brother    Stroke Maternal Grandmother    Stroke Maternal Grandfather    Hypertension Brother        POSS   Social History   Socioeconomic History   Marital status: Divorced    Spouse name: Not on file   Number of children: 3   Years of education: 12   Highest education level: Bachelor's degree (e.g., BA, AB, BS)  Occupational History   Not on file  Tobacco Use   Smoking status: Never   Smokeless tobacco: Never  Vaping Use   Vaping Use: Never used  Substance and Sexual Activity   Alcohol use: No   Drug use: No   Sexual activity: Yes  Other Topics Concern   Not on file  Social History Narrative   Pt lives with his brother   Social Determinants of Health   Financial Resource Strain: Low Risk    Difficulty of Paying Living Expenses: Not hard at all  Food Insecurity: No Food Insecurity   Worried About Programme researcher, broadcasting/film/video in the Last Year: Never true  Ran Out of Food in the Last Year: Never true  Transportation Needs: No Transportation Needs   Lack of Transportation (Medical): No   Lack of Transportation (Non-Medical): No  Physical Activity: Insufficiently Active   Days of Exercise per Week: 7 days   Minutes of Exercise per Session: 10 min  Stress: Stress Concern Present   Feeling of Stress : To some extent  Social Connections: Moderately Integrated   Frequency of Communication with Friends and Family: More than three times a week   Frequency of Social Gatherings with Friends and Family: More than three times a week   Attends Religious Services: More than 4 times per year   Active Member of [employer] or Organizations: Yes   Attends Engineer, structural: More than 4 times per year   Marital Status: Divorced    Tobacco  Counseling Counseling given: Not Answered   Clinical Intake:  Pre-visit preparation completed: Yes  Pain Score: 4  Pain Type: Chronic pain Pain Location: Neck Pain Orientation: Left, Anterior Pain Descriptors / Indicators: Sharp     BMI - recorded: 24.56 Nutritional Status: BMI of 19-24  Normal Diabetes: No            Activities of Daily Living In your present state of health, do you have any difficulty performing the following activities: 02/26/2021 02/11/2021  Hearing? N N  Vision? N -  Difficulty concentrating or making decisions? N N  Walking or climbing stairs? N Y  Dressing or bathing? N N  Doing errands, shopping? [name]  Preparing Food and eating ? Y -  Using the Toilet? N -  In the past six months, have you accidently leaked urine? N -  Do you have problems with loss of bowel control? N -  Managing your Medications? Y -  Managing your Finances? Y -  Housekeeping or managing your Housekeeping? Y -  Some recent data might be hidden    Patient Care Team: Berniece Salines, FNP as PCP - General (Nurse Practitioner)  Indicate any recent Medical Services you may have received from other than [facility] providers in the past year (date may be approximate).     Assessment:   This is a routine wellness examination for Colin Barron.  Hearing/Vision screen Hearing Screening - Comments::  Pt denies hearing difficulty Vision Screening - Comments::  Annual vision screenings done at [facility]  Dietary issues and exercise activities discussed: Current Exercise Habits: Home exercise routine, Type of exercise: walking, Time (Minutes): 10, Frequency (Times/Week): 7, Weekly Exercise (Minutes/Week): 70, Intensity: Mild   Goals Addressed   None    Depression Screen PHQ 2/9 Scores 02/26/2021 02/11/2021 01/02/2021 02/26/2020 12/21/2019 03/16/2019 02/20/2019  PHQ - 2 Score 0 3 0 0 0 0 0  PHQ- 9 Score 0 3 0 - - 0 -    Fall Risk Fall Risk  02/26/2021 02/11/2021 01/02/2021 02/26/2020  12/21/2019  Falls in the past year? 0 0 0 0 0  Number falls in past yr: 0 0 0 0 0  Injury with Fall? 0 0 0 0 0  Risk for fall due to : Impaired balance/gait - - No Fall Risks -  Follow up Falls prevention discussed - - Falls prevention discussed Falls evaluation completed    FALL RISK PREVENTION PERTAINING TO THE HOME:  Any stairs in or around the home? Yes  If so, are there any without handrails? No  Home free of loose throw rugs in walkways, pet beds, electrical cords, etc?  Yes  Adequate lighting in your home to reduce risk of falls? Yes   ASSISTIVE DEVICES UTILIZED TO PREVENT FALLS:  Life alert? No  Use of a cane, walker or w/c? Yes  Grab bars in the bathroom? Yes  Shower chair or bench in shower? No  Elevated toilet seat or a handicapped toilet? Yes   TIMED UP AND GO:  Was the test performed? No .  Length of time to ambulate 10 feet: 8 sec.   Gait slow and steady without use of assistive device  Cognitive Function:Normal cognitive status assessed by direct observation by this Nurse Health Advisor. No abnormalities found.       6CIT Screen 02/20/2019  What Year? 0 points  What month? 0 points  What time? 0 points  Count back from 20 0 points  Months in reverse 0 points  Repeat phrase 0 points  Total Score 0    Immunizations Immunization History  Administered Date(s) Administered   Influenza, High Dose Seasonal PF 01/28/2017   Moderna Sars-Covid-2 Vaccination 06/04/2019, 07/04/2019    TDAP status: Due, Education has been provided regarding the importance of this vaccine. Advised may receive this vaccine at local pharmacy or Health Dept. Aware to provide a copy of the vaccination record if obtained from local pharmacy or Health Dept. Verbalized acceptance and understanding.  Flu Vaccine status: Declined, Education has been provided regarding the importance of this vaccine but patient still declined. Advised may receive this vaccine at local pharmacy or Health Dept.  Aware to provide a copy of the vaccination record if obtained from local pharmacy or Health Dept. Verbalized acceptance and understanding.  Pneumococcal vaccine status: Declined,  Education has been provided regarding the importance of this vaccine but patient still declined. Advised may receive this vaccine at local pharmacy or Health Dept. Aware to provide a copy of the vaccination record if obtained from local pharmacy or Health Dept. Verbalized acceptance and understanding.   Covid-19 vaccine status: Completed vaccines  Qualifies for Shingles Vaccine? Yes   Zostavax completed No   Shingrix Completed?: No.    Education has been provided regarding the importance of this vaccine. Patient has been advised to call insurance company to determine out of pocket expense if they have not yet received this vaccine. Advised may also receive vaccine at local pharmacy or Health Dept. Verbalized acceptance and understanding.  Screening Tests Health Maintenance  Topic Date Due   Pneumonia Vaccine 4+ Years old (1 - PCV) Never done   Zoster Vaccines- Shingrix (1 of 2) Never done   COVID-19 Vaccine (3 - Moderna risk series) 08/01/2019   INFLUENZA VACCINE  07/24/2021 (Originally 11/24/2020)   TETANUS/TDAP  04/26/2026   COLONOSCOPY (Pts 45-71yrs Insurance coverage will need to be confirmed)  10/02/2028   Hepatitis C Screening  Completed   HPV VACCINES  Aged Out    Health Maintenance  Health Maintenance Due  Topic Date Due   Pneumonia Vaccine 23+ Years old (1 - PCV) Never done   Zoster Vaccines- Shingrix (1 of 2) Never done   COVID-19 Vaccine (3 - Moderna risk series) 08/01/2019    Colorectal cancer screening: No longer required.   Lung Cancer Screening: (Low Dose CT Chest recommended if Age 74-80 years, 30 pack-year currently smoking OR have quit w/in 15years.) does not qualify.    Additional Screening:  Hepatitis C Screening: does qualify; Completed 10/03/2018  Vision Screening: Recommended  annual ophthalmology exams for early detection of glaucoma and other disorders of the eye. Is  the patient up to date with their annual eye exam?  Yes  Who is the provider or what is the name of the office in which the patient attends annual eye exams? [facility]  If pt is not established with a provider, would they like to be referred to a provider to establish care? No .   Dental Screening: Recommended annual dental exams for proper oral hygiene  Community Resource Referral / Chronic Care Management: CRR required this visit?  No   CCM required this visit?  No      Plan:     I have personally reviewed and noted the following in the patient's chart:   Medical and social history Use of alcohol, tobacco or illicit drugs  Current medications and supplements including opioid prescriptions. Patient is not currently taking opioid prescriptions. Functional ability and status Nutritional status Physical activity Advanced directives List of other physicians Hospitalizations, surgeries, and ER visits in previous 12 months Vitals Screenings to include cognitive, depression, and falls Referrals and appointments  In addition, I have reviewed and discussed with patient certain preventive protocols, quality metrics, and best practice recommendations. A written personalized care plan for preventive services as well as general preventive health recommendations were provided to patient.     Hal Hope   02/26/2021   Nurse Notes:  none

## 2021-02-27 NOTE — Telephone Encounter (Signed)
Mallory with [facility] called to check status of verbal orders / please advise   Mallory wasn't sure if someone called her with these orders

## 2021-02-27 NOTE — Telephone Encounter (Signed)
Verbal orders given  

## 2021-03-12 NOTE — ED Provider Notes (Signed)
Advised patient be seen in the ED for further evaluation.    Tomi Bamberger, PA-C 03/12/21 1323

## 2021-03-16 ENCOUNTER — Other Ambulatory Visit: Payer: Self-pay

## 2021-03-16 ENCOUNTER — Ambulatory Visit
Admission: RE | Admit: 2021-03-16 | Discharge: 2021-03-16 | Disposition: A | Discharge status: Home / Self Care | Payer: Medicare PPO | Source: Ambulatory Visit | Attending: Family Medicine | Admitting: Family Medicine

## 2021-03-16 DIAGNOSIS — R590 Localized enlarged lymph nodes: Secondary | ICD-10-CM | POA: Diagnosis not present

## 2021-03-16 LAB — POCT I-STAT CREATININE: Creatinine, Ser: 0.9 mg/dL (ref 0.61–1.24)

## 2021-03-16 MED ORDER — IOHEXOL 300 MG/ML  SOLN
75.0000 mL | Freq: Once | INTRAMUSCULAR | Status: AC | PRN
  Administered 2021-03-16: 75 mL via INTRAVENOUS

## 2021-03-17 ENCOUNTER — Other Ambulatory Visit: Payer: Self-pay | Admitting: Family Medicine

## 2021-03-17 DIAGNOSIS — R590 Localized enlarged lymph nodes: Secondary | ICD-10-CM

## 2021-03-25 ENCOUNTER — Telehealth: Payer: Self-pay | Admitting: Nurse Practitioner

## 2021-03-25 NOTE — Telephone Encounter (Signed)
Pt following up on referral to pulmonary. The referral says Colin Barron.  Pt is staying with his brother in [place], and wants to go to [employer] location. Please advise

## 2021-03-26 NOTE — Telephone Encounter (Signed)
Pt called for an update on the referral request. Pt stated he has not heard back from anyone. Pt requests call back. Cb# [phone]

## 2021-03-31 NOTE — Telephone Encounter (Signed)
Patient called back checking status of referral

## 2021-04-01 ENCOUNTER — Telehealth: Payer: Self-pay

## 2021-04-01 NOTE — Telephone Encounter (Signed)
I explained to the patient that the original office that he requested to be seen at for his pulmonology care has closed and I needed to know where else he would like to go.   Patient informed me that he is now staying with his brother and needed something closer to [place].   I informed him that we do have an office in [place] that should be able to address his needs [facility]) and that he should hear from their office within a week.   He said ok and thanks.

## 2021-04-13 ENCOUNTER — Ambulatory Visit: Payer: Medicare PPO | Admitting: Nurse Practitioner

## 2021-04-25 ENCOUNTER — Emergency Department (HOSPITAL_COMMUNITY)
Admission: EM | Admit: 2021-04-25 | Discharge: 2021-04-25 | Disposition: A | Discharge status: Home / Self Care | Payer: Medicare PPO | Attending: Emergency Medicine | Admitting: Emergency Medicine

## 2021-04-25 ENCOUNTER — Encounter (HOSPITAL_COMMUNITY): Payer: Self-pay

## 2021-04-25 ENCOUNTER — Other Ambulatory Visit: Payer: Self-pay

## 2021-04-25 ENCOUNTER — Emergency Department (HOSPITAL_COMMUNITY): Payer: Medicare PPO

## 2021-04-25 DIAGNOSIS — M542 Cervicalgia: Secondary | ICD-10-CM | POA: Diagnosis not present

## 2021-04-25 DIAGNOSIS — N3000 Acute cystitis without hematuria: Secondary | ICD-10-CM | POA: Diagnosis not present

## 2021-04-25 DIAGNOSIS — Z20822 Contact with and (suspected) exposure to covid-19: Secondary | ICD-10-CM | POA: Insufficient documentation

## 2021-04-25 DIAGNOSIS — R251 Tremor, unspecified: Secondary | ICD-10-CM | POA: Insufficient documentation

## 2021-04-25 DIAGNOSIS — R0789 Other chest pain: Secondary | ICD-10-CM | POA: Diagnosis not present

## 2021-04-25 DIAGNOSIS — Z8673 Personal history of transient ischemic attack (TIA), and cerebral infarction without residual deficits: Secondary | ICD-10-CM | POA: Insufficient documentation

## 2021-04-25 DIAGNOSIS — R5383 Other fatigue: Secondary | ICD-10-CM | POA: Insufficient documentation

## 2021-04-25 DIAGNOSIS — R079 Chest pain, unspecified: Secondary | ICD-10-CM | POA: Diagnosis present

## 2021-04-25 LAB — MAGNESIUM: Magnesium: 1.7 mg/dL (ref 1.7–2.4)

## 2021-04-25 LAB — CBC WITH DIFFERENTIAL/PLATELET
Abs Immature Granulocytes: 0.04 10*3/uL (ref 0.00–0.07)
Basophils Absolute: 0 10*3/uL (ref 0.0–0.1)
Basophils Relative: 0 %
Eosinophils Absolute: 0 10*3/uL (ref 0.0–0.5)
Eosinophils Relative: 0 %
HCT: 40 % (ref 39.0–52.0)
Hemoglobin: 13.4 g/dL (ref 13.0–17.0)
Immature Granulocytes: 1 %
Lymphocytes Relative: 3 %
Lymphs Abs: 0.3 10*3/uL — ABNORMAL LOW (ref 0.7–4.0)
MCH: 30.7 pg (ref 26.0–34.0)
MCHC: 33.5 g/dL (ref 30.0–36.0)
MCV: 91.7 fL (ref 80.0–100.0)
Monocytes Absolute: 0.3 10*3/uL (ref 0.1–1.0)
Monocytes Relative: 4 %
Neutro Abs: 7.2 10*3/uL (ref 1.7–7.7)
Neutrophils Relative %: 92 %
Platelets: 124 10*3/uL — ABNORMAL LOW (ref 150–400)
RBC: 4.36 MIL/uL (ref 4.22–5.81)
RDW: 12.4 % (ref 11.5–15.5)
WBC: 7.8 10*3/uL (ref 4.0–10.5)
nRBC: 0 % (ref 0.0–0.2)

## 2021-04-25 LAB — BASIC METABOLIC PANEL
Anion gap: 10 (ref 5–15)
BUN: 8 mg/dL (ref 8–23)
CO2: 24 mmol/L (ref 22–32)
Calcium: 9.2 mg/dL (ref 8.9–10.3)
Chloride: 102 mmol/L (ref 98–111)
Creatinine, Ser: 0.81 mg/dL (ref 0.61–1.24)
GFR, Estimated: >60 mL/min (ref >60)
Glucose, Bld: 120 mg/dL — ABNORMAL HIGH (ref 70–99)
Potassium: 3.3 mmol/L — ABNORMAL LOW (ref 3.5–5.1)
Sodium: 136 mmol/L (ref 135–145)

## 2021-04-25 LAB — URINALYSIS, ROUTINE W REFLEX MICROSCOPIC
Bilirubin Urine: NEGATIVE
Glucose, UA: NEGATIVE mg/dL
Hgb urine dipstick: NEGATIVE
Ketones, ur: 5 mg/dL — AB
Nitrite: POSITIVE — AB
Protein, ur: NEGATIVE mg/dL
Specific Gravity, Urine: 1.01 (ref 1.005–1.030)
pH: 8 (ref 5.0–8.0)

## 2021-04-25 LAB — TROPONIN I (HIGH SENSITIVITY)
Troponin I (High Sensitivity): 6 ng/L (ref <18)
Troponin I (High Sensitivity): 8 ng/L (ref <18)

## 2021-04-25 LAB — RESP PANEL BY RT-PCR (FLU A&B, COVID) ARPGX2
Influenza A by PCR: NEGATIVE
Influenza B by PCR: NEGATIVE
SARS Coronavirus 2 by RT PCR: NEGATIVE

## 2021-04-25 IMAGING — CR DG CHEST 2V
2 series · 2 of 2 positions shown · non-contrast
Comparison: Chest CT [DATE]

CLINICAL DATA: Chest pain

EXAM:
CHEST - 2 VIEW

[chest lat]
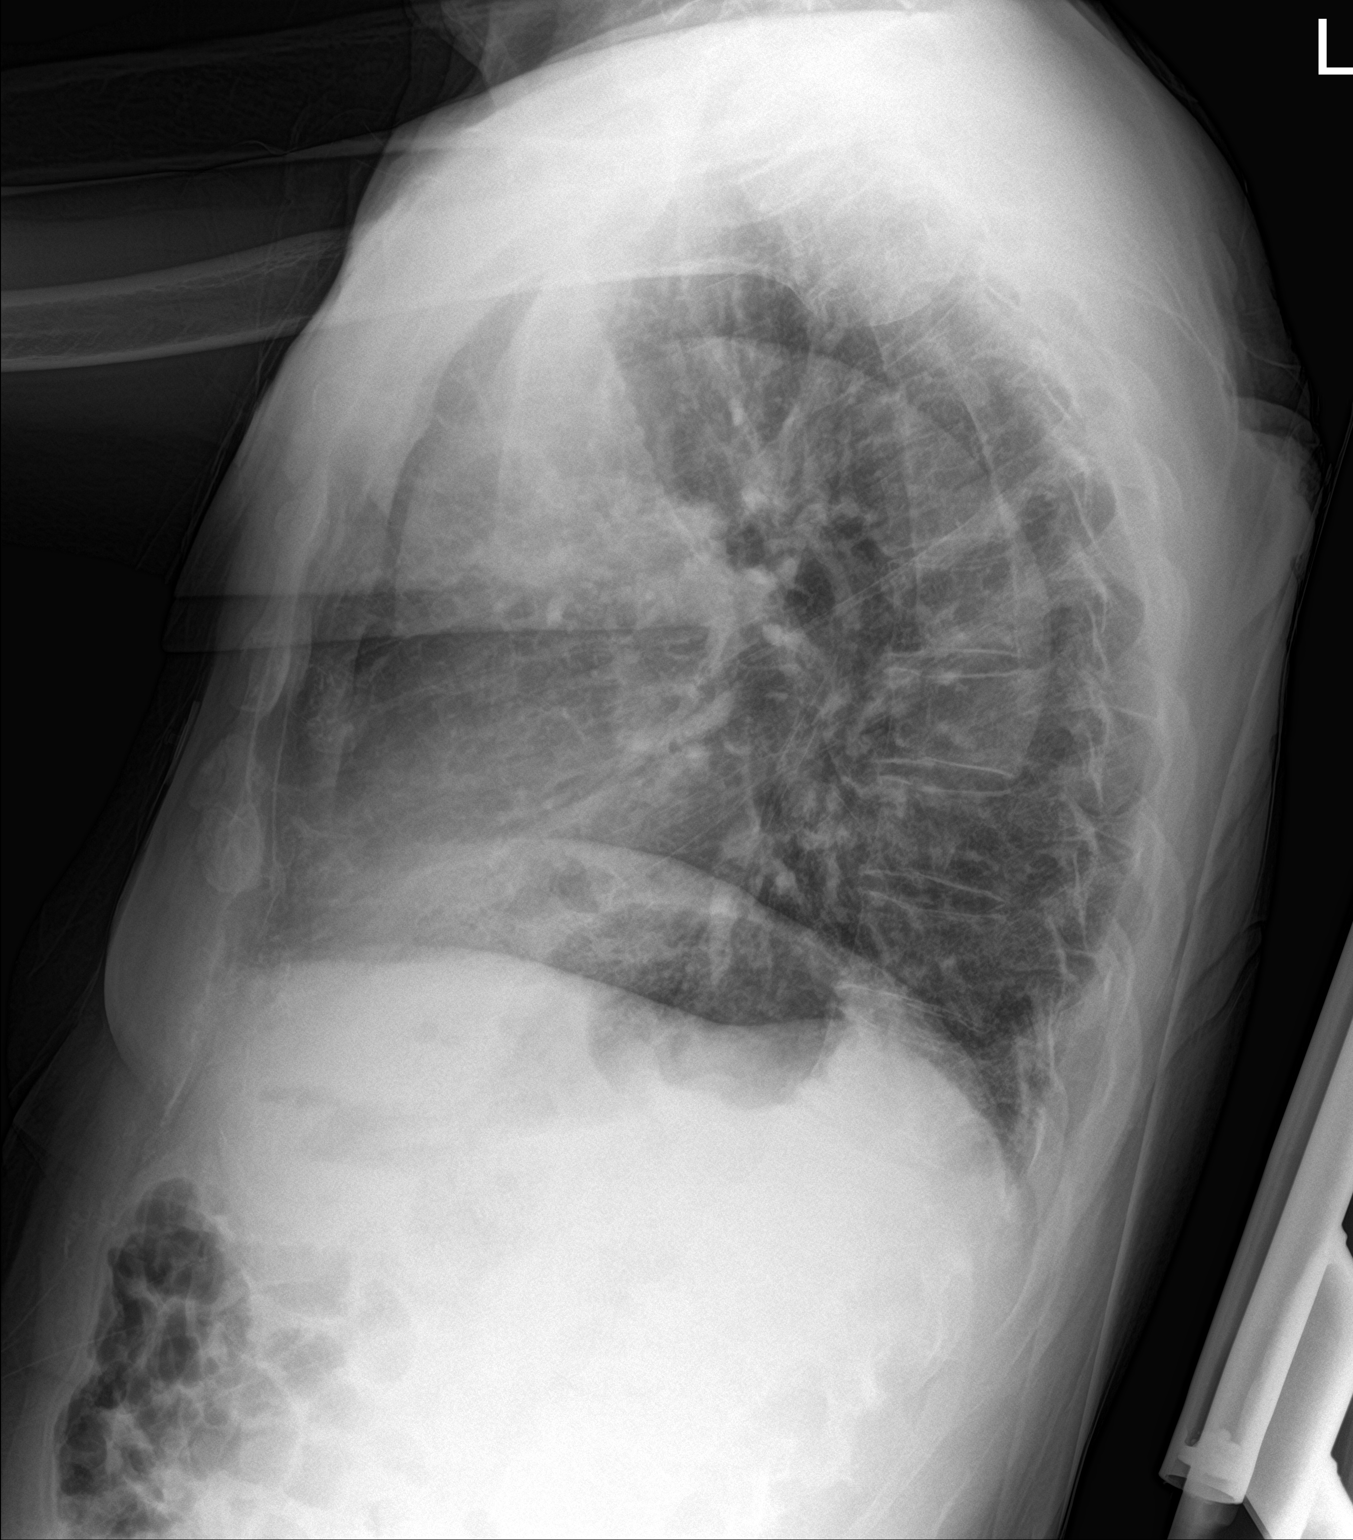

[chest ap]
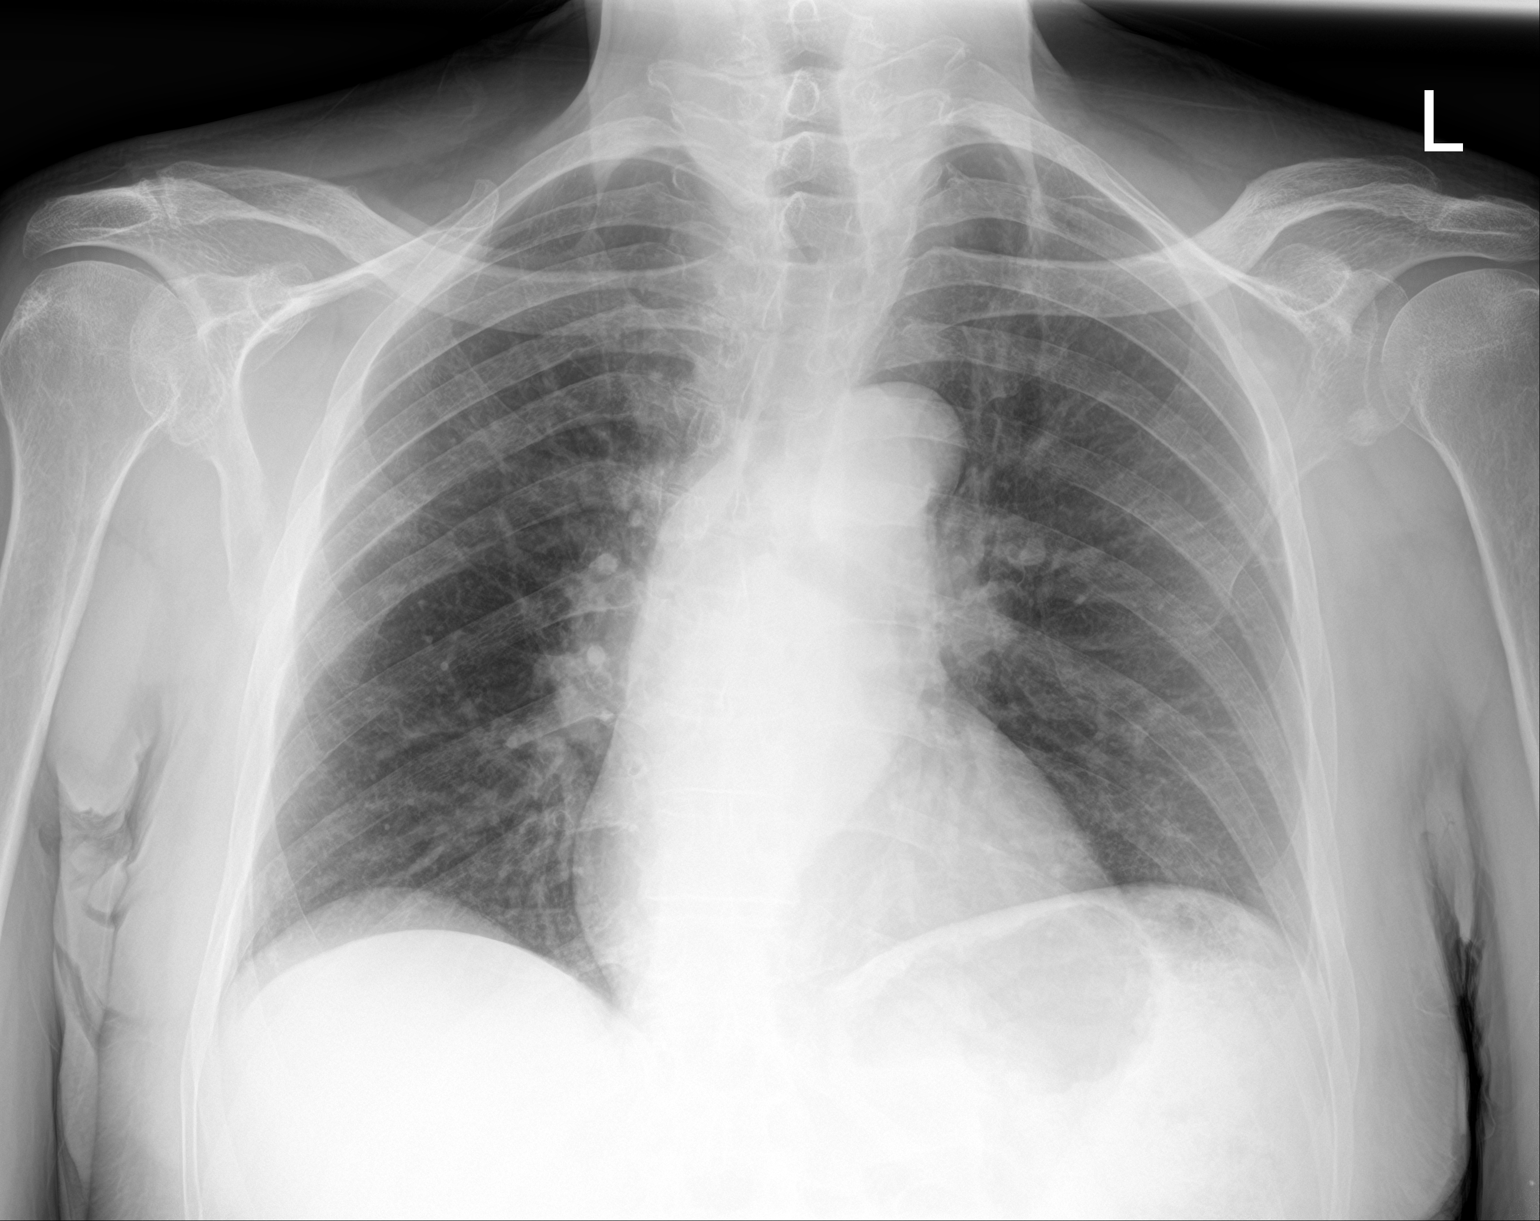

[2 of 2 positions shown; findings below may reference images not displayed]

FINDINGS: Normal heart size. Aortic tortuosity. Questionable visualization of
nodularity at the left apex seen on recent CT. There is no edema,
acute consolidation, effusion, or pneumothorax. Scoliosis.
IMPRESSION: No acute finding when compared to priors.

## 2021-04-25 MED ORDER — SODIUM CHLORIDE 0.9 % IV BOLUS (SEPSIS)
500.0000 mL | Freq: Once | INTRAVENOUS | Status: AC
  Administered 2021-04-25: 500 mL via INTRAVENOUS

## 2021-04-25 MED ORDER — CEFUROXIME AXETIL 500 MG PO TABS: 500.0000 mg | ORAL_TABLET | Freq: Two times a day (BID) | ORAL | 0 refills | Status: AC

## 2021-04-25 MED ORDER — ACETAMINOPHEN 325 MG PO TABS
650.0000 mg | ORAL_TABLET | Freq: Once | ORAL | Status: AC
  Administered 2021-04-25: 650 mg via ORAL
  Filled 2021-04-25: qty 2

## 2021-04-25 MED ORDER — SODIUM CHLORIDE 0.9 % IV SOLN
1000.0000 mL | INTRAVENOUS | Status: DC
  Administered 2021-04-25: 1000 mL via INTRAVENOUS

## 2021-04-25 MED ORDER — SODIUM CHLORIDE 0.9 % IV SOLN
1.0000 g | Freq: Once | INTRAVENOUS | Status: AC
  Administered 2021-04-25: 1 g via INTRAVENOUS
  Filled 2021-04-25: qty 10

## 2021-04-25 NOTE — ED Notes (Signed)
Attempted IV 2x unsuccessful

## 2021-04-25 NOTE — Discharge Instructions (Addendum)
Take the antibiotics as prescribed.  Follow-up with your doctor to be rechecked.  Return as needed for worsening symptoms

## 2021-04-25 NOTE — ED Triage Notes (Signed)
Pt. Stated, Ive had chest pain with neck this morning with some shaking.

## 2021-04-25 NOTE — ED Provider Notes (Signed)
[facility] EMERGENCY DEPARTMENT Provider Note   CSN: [id number] Arrival date & time: 04/25/21  0935     History Chief Complaint  Patient presents with   Chest Pain   Neck Pain   Shaking    Colin Barron is a 75 y.o. male.   Chest Pain Neck Pain Associated symptoms: chest pain     Pt woke up this morning feeling chilled and shaky.  He has trouble with shakes in the past but has not ever had that evaluated.  It occurs on both sides.  This morning he felt his heart racing and chest hurting.  That lasted for about 15 minutes.  It would ease up then come back several times this morning.  He did not take his temp.  No trouble urinating.  No coughing.  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   BPH (benign prostatic hyperplasia) 12/08/2016    Past Surgical History:  Procedure Laterality Date   BIOPSY PROSTATE     CHOLECYSTECTOMY     CYST REMOVAL NECK     TONSILLECTOMY         Family History  Problem Relation Age of Onset   Diabetes Mother    Hypertension Mother    Intracerebral hemorrhage Brother    Hypertension Brother    Stroke Maternal Grandmother    Stroke Maternal Grandfather    Hypertension Brother        POSS    Social History   Tobacco Use   Smoking status: Never   Smokeless tobacco: Never  Vaping Use   Vaping Use: Never used  Substance Use Topics   Alcohol use: No   Drug use: No    Home Medications Prior to Admission medications   Medication Sig Start Date End Date Taking? Authorizing Provider  cefUROXime (CEFTIN) 500 MG tablet Take 1  tablet (500 mg total) by mouth 2 (two) times daily with a meal for 10 days. 04/25/21 05/05/21 Yes Dorie Rank, MD  aspirin EC 81 MG EC tablet Take 1 tablet (81 mg total) by mouth daily. Swallow whole. 02/09/21   Domenic Polite, MD  Cholecalciferol (VITAMIN D3 PO) Take by mouth daily.    [provider]  Polyethyl Glycol-Propyl Glycol (SYSTANE) 0.4-0.3 % SOLN Apply to eye daily.    [provider]    Allergies    Amoxicillin  Review of Systems   Review of Systems  Cardiovascular:  Positive for chest pain.  Musculoskeletal:  Positive for neck pain.  All other systems reviewed and are negative.  Physical Exam Updated Vital Signs BP 102/64    Pulse 80    Temp 100.2 F (37.9 C) (Oral)    Resp 17    Ht 1.803 m (5\' 11" )    Wt 80.7 kg    SpO2 97%    BMI 24.83 kg/m   Physical Exam Vitals and nursing note reviewed.  Constitutional:      Appearance: He is well-developed. He is ill-appearing. He is not diaphoretic.  HENT:     Head: Normocephalic and atraumatic.     Right Ear: External ear normal.  Left Ear: External ear normal.  Eyes:     General: No scleral icterus.       Right eye: No discharge.        Left eye: No discharge.     Conjunctiva/sclera: Conjunctivae normal.  Neck:     Trachea: No tracheal deviation.  Cardiovascular:     Rate and Rhythm: Normal rate and regular rhythm.  Pulmonary:     Effort: Pulmonary effort is normal. No respiratory distress.     Breath sounds: Normal breath sounds. No stridor. No wheezing or rales.  Abdominal:     General: Bowel sounds are normal. There is no distension.     Palpations: Abdomen is soft.     Tenderness: There is no abdominal tenderness. There is no guarding or rebound.  Musculoskeletal:        General: No tenderness or deformity.     Cervical back: Neck supple.     Right lower leg: No edema.     Left lower leg: No edema.  Skin:    General: Skin is warm and dry.     Findings: No rash.  Neurological:      General: No focal deficit present.     Mental Status: He is alert.     Cranial Nerves: No cranial nerve deficit (no facial droop, extraocular movements intact, no slurred speech).     Sensory: No sensory deficit.     Motor: Weakness present. No abnormal muscle tone or seizure activity.     Coordination: Coordination normal.     Comments: No focal deficit, equal grip strength and plantar flexion  Psychiatric:        Mood and Affect: Mood normal.    ED Results / Procedures / Treatments   Labs (all labs ordered are listed, but only abnormal results are displayed) Labs Reviewed  BASIC METABOLIC PANEL - Abnormal; Notable for the following components:      Result Value   Potassium 3.3 (*)    Glucose, Bld 120 (*)    All other components within normal limits  CBC WITH DIFFERENTIAL/PLATELET - Abnormal; Notable for the following components:   Platelets 124 (*)    Lymphs Abs 0.3 (*)    All other components within normal limits  URINALYSIS, ROUTINE W REFLEX MICROSCOPIC - Abnormal; Notable for the following components:   Ketones, ur 5 (*)    Nitrite POSITIVE (*)    Leukocytes,Ua SMALL (*)    Bacteria, UA RARE (*)    All other components within normal limits  RESP PANEL BY RT-PCR (FLU A&B, COVID) ARPGX2  MAGNESIUM  TROPONIN I (HIGH SENSITIVITY)  TROPONIN I (HIGH SENSITIVITY)    EKG EKG Interpretation  Date/Time:  Saturday April 25 2021 09:33:20 EST Ventricular Rate:  91 PR Interval:  150 QRS Duration: 80 QT Interval:  326 QTC Calculation: 400 R Axis:   -9 Text Interpretation: Normal sinus rhythm Normal ECG When compared with ECG of 06-Feb-2021 13:58, No significant change since last tracing Confirmed by Dorie Rank [phone]) on 04/25/2021 11:23:54 AM  Radiology DG Chest 2 View  Result Date: 04/25/2021 CLINICAL DATA:  Chest pain EXAM: CHEST - 2 VIEW COMPARISON:  Chest CT 03/16/2021 FINDINGS: Normal heart size. Aortic tortuosity. Questionable visualization of nodularity at the  left apex seen on recent CT. There is no edema, acute consolidation, effusion, or pneumothorax. Scoliosis. IMPRESSION: No acute finding when compared to priors. Electronically Signed   By: Jorje Guild M.D.   On: 04/25/2021 11:24  Procedures Procedures   Medications Ordered in ED Medications  sodium chloride 0.9 % bolus 500 mL (0 mLs Intravenous Stopped 04/25/21 1337)    Followed by  0.9 %  sodium chloride infusion (0 mLs Intravenous Stopped 04/25/21 1435)  acetaminophen (TYLENOL) tablet 650 mg (650 mg Oral Given 04/25/21 1027)  cefTRIAXone (ROCEPHIN) 1 g in sodium chloride 0.9 % 100 mL IVPB (0 g Intravenous Stopped 04/25/21 1522)    ED Course  I have reviewed the triage vital signs and the nursing notes.  Pertinent labs & imaging results that were available during my care of the patient were reviewed by me and considered in my medical decision making (see chart for details).  Clinical Course as of 04/25/21 1536  Sat Apr 25, 2021  1403 Serial troponins are normal. [JK]  1403 Urinalysis does show positive nitrite and 1120 white blood cells [JK]  1536 Chest x-ray without pneumonia.  [JK]    Clinical Course User Index [JK] Linwood Dibbles, MD   MDM Rules/Calculators/A&P                         Patient presented to the ED for evaluation of fatigue chest comfort and shaking.  No seizure-like activity.  Sounds like it may have been tremors as rigors.  ED work-up shows normal cardiac evaluation.  CBC and metabolic panel unremarkable.  Urinalysis is consistent with UTI.  Patient denies any significant symptoms of heart sounds like he might of been having some shaking chills.  We will treat him for urinary tract infection.  Patient tolerated Rocephin in the ED without difficulty.    Final Clinical Impression(s) / ED Diagnoses Final diagnoses:  Acute cystitis without hematuria    Rx / DC Orders ED Discharge Orders          Ordered    cefUROXime (CEFTIN) 500 MG tablet  2 times daily  with meals        04/25/21 1534             Linwood Dibbles, MD 04/25/21 1536

## 2021-04-25 NOTE — ED Provider Notes (Signed)
Emergency Medicine Provider Triage Evaluation Note  WINTHROP SHANNAHAN , a 75 y.o. male  was evaluated in triage.  Pt complains of palpitations and chest pain.  Patient reports he woke up this morning when he went to walk to the bathroom he started to feel like his heart was racing, he reports this lasted 10-15 minutes.  During this time he also had left-sided chest pain radiating up into his neck.  He reports with this he felt fatigued and felt lightheaded but did not pass out.  Denies shortness of breath.  Review of Systems  Positive: Chest pain, palpitations, neck pain, lightheadedness Negative: Fevers, cough, shortness of breath, syncope  Physical Exam  BP 107/73 (BP Location: Left Arm)    Pulse 89    Temp 100.2 F (37.9 C) (Oral)    Resp 16    SpO2 97%  Gen:   Awake, no distress   Resp:  Normal effort, CTA bilat Cardiac:           RRR MSK:   Moves extremities without difficulty  Other:    Medical Decision Making  Medically screening exam initiated at 10:03 AM.  Appropriate orders placed.  Gillis Boardley Ackers was informed that the remainder of the evaluation will be completed by another provider, this initial triage assessment does not replace that evaluation, and the importance of remaining in the ED until their evaluation is complete.     Dartha Lodge, PA-C 04/25/21 1017    Cathren Laine, MD 04/25/21 1320

## 2021-04-30 ENCOUNTER — Telehealth: Payer: Self-pay | Admitting: *Deleted

## 2021-04-30 NOTE — Telephone Encounter (Signed)
Pt called to get clarification of Rx directions.  RNCM confirmed with EDP that pt should take all Rx provided (7 days).  Pt appreciative of clarification.  No further EDCM needs identified.

## 2021-05-07 ENCOUNTER — Ambulatory Visit: Payer: Medicare PPO | Admitting: Pulmonary Disease

## 2021-05-07 ENCOUNTER — Ambulatory Visit: Payer: Self-pay | Admitting: *Deleted

## 2021-05-07 ENCOUNTER — Encounter: Payer: Self-pay | Admitting: Pulmonary Disease

## 2021-05-07 ENCOUNTER — Other Ambulatory Visit: Payer: Self-pay

## 2021-05-07 VITALS — BP 100/64 | HR 80 | Temp 97.3°F | Ht 71.0 in | Wt 181.0 lb

## 2021-05-07 DIAGNOSIS — Z87440 Personal history of urinary (tract) infections: Secondary | ICD-10-CM

## 2021-05-07 DIAGNOSIS — R590 Localized enlarged lymph nodes: Secondary | ICD-10-CM

## 2021-05-07 DIAGNOSIS — Z8616 Personal history of COVID-19: Secondary | ICD-10-CM | POA: Diagnosis not present

## 2021-05-07 NOTE — Telephone Encounter (Signed)
Patient called to cancel appt 05/11/21 until he has completed his PET scan scheduled on 05/18/21. Patient reports he will call back to reschedule appt. Appt for 05/11/21 cancelled .

## 2021-05-07 NOTE — Patient Instructions (Signed)
We are going to get a PET/CT  We will see him in follow-up in 3 to 4 weeks time after the PET/CT is done  We will let you know the results of the PET/CT as soon as these are known.

## 2021-05-07 NOTE — Progress Notes (Signed)
Subjective:    Patient ID: Colin Barron, male    DOB: Nov 30, 1945, 76 y.o.   MRN: [id number] Chief Complaint  Patient presents with   Consult   HPI Patient is 76 year old lifelong never smoker who presents for evaluation of mediastinal adenopathy, he is kindly referred by Dr. Ellwood Dense. He presents today with his brother and son.  The findings are incidental findings.  The patient was admitted to [facility] in mid October 2022 due to transient ischemic attack.  Patient was also noted to have a E. coli UTI during that time.  On imaging of the head and neck for evaluation of TIA mediastinal adenopathy was noted on the upper chest windows.  The patient then underwent CT chest with contrast on 16 March 2021 which showed a cluster of lymph nodes in the left hilum measuring 2.5 x 3.1 cm, bilateral hilar and mediastinal adenopathy, there were areas of partial calcification in these lymph nodes.  This is potentially related to benign granulomatous disease.  The patient has no symptoms with regards to these findings.  He has not had any recent nocturnal sweats though he notices these usually when he has a UTI.  No fevers or chills.  No cough or sputum production no hemoptysis.  No weight loss or anorexia (unintentional weight loss noted by primary care team however, patient does not endorse).    Patient had COVID in or around August 2022, no hospitalization.  No other viral illnesses reported.  Patient has no occupational exposure.  He is retired Furniture conservator/restorer.  No known history of sarcoid.  He has resided in [place] as well as [place].  Review of Systems A 10 point review of systems was performed and it is as noted above otherwise negative.  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 Swallow whole.   Cholecalciferol (VITAMIN D3 PO) Take by mouth daily.   Polyethyl Glycol-Propyl Glycol (SYSTANE) 0.4-0.3 % SOLN Apply to eye daily.   Immunization History  Administered Date(s) Administered   Influenza, High Dose Seasonal PF 01/28/2017   Moderna Sars-Covid-2 Vaccination 06/04/2019, 07/04/2019      Objective:   Physical Exam BP 100/64 (BP Location: Left Arm, Patient Position: Sitting, Cuff Size: Normal)    Pulse 80    Temp (!) 97.3 F (36.3 C) (Oral)    Ht 5\' 11"  (1.803 m)    Wt 181 lb (82.1 kg)    SpO2 99%  BMI 25.24 kg/m  GENERAL: Debilitated appearing gentleman, no acute distress.  Well-nourished well-developed, ambulates with assistance of a cane.  No respiratory distress, no conversational dyspnea. HEAD: Normocephalic, atraumatic.  EYES: Pupils equal, round, reactive to light.  No scleral icterus.  MOUTH: Nose/mouth/throat not examined due to masking requirements for COVID 19. NECK: Supple. No thyromegaly. Trachea midline. No JVD.  No adenopathy. PULMONARY: Good air entry bilaterally.  No adventitious sounds. CARDIOVASCULAR: S1 and S2. Regular rate and rhythm.  No rubs, murmurs or gallops heard. ABDOMEN:  Benign. MUSCULOSKELETAL: No joint deformity, no clubbing, no edema.  NEUROLOGIC: No focal deficit, no gait disturbance, speech is fluent. SKIN: Intact,warm,dry. PSYCH: Mood and behavior normal.  Representative images from 16 March 2021 CT scan showing mediastinal adenopathy:   Some areas of calcification noted:     Assessment & Plan:     ICD-10-CM   1. Mediastinal adenopathy  R59.0 NM PET Image Initial (PI) Skull Base To Thigh (F-18 FDG)   Patient has lived in endemic areas for histoplasmosis Query granulomatous disease Cannot exclude malignancy PET/CT    2. History of recurrent UTIs  Z87.440    This issue adds complexity to his management Notes night sweats when he has UTI No night sweats at any other time    3. Personal history of COVID-19  Z86.16    Had around August 2022 No hospitalization required     Orders Placed This Encounter  Procedures   NM PET Image Initial (PI) Skull Base To Thigh (F-18 FDG)    Standing Status:   Future    Standing Expiration Date:   05/07/2022    Order Specific Question:   If indicated for the ordered procedure, I authorize the administration of a radiopharmaceutical per Radiology protocol    Answer:   Yes    Order Specific Question:   Preferred imaging location?    Answer:   [facility]   We will obtain a PET/CT.  We will see the patient in follow-up in 3 to 4 weeks time, he is to contact us prior to that time should any new difficulties arise.  We will determine at that time if further work-up is necessary.  Renold Don, MD Advanced Bronchoscopy [facility]    *This note was dictated using voice recognition software/Dragon.  Despite best efforts to proofread, errors can occur which can change the meaning. Any transcriptional errors that result from this process are unintentional and may not be fully corrected at the time of dictation.

## 2021-05-08 ENCOUNTER — Encounter: Payer: Self-pay | Admitting: Pulmonary Disease

## 2021-05-11 ENCOUNTER — Ambulatory Visit: Payer: Medicare PPO | Admitting: Nurse Practitioner

## 2021-05-18 ENCOUNTER — Encounter (HOSPITAL_COMMUNITY)
Admission: RE | Admit: 2021-05-18 | Discharge: 2021-05-18 | Disposition: A | Discharge status: Home / Self Care | Payer: Medicare PPO | Source: Ambulatory Visit | Attending: Pulmonary Disease | Admitting: Pulmonary Disease

## 2021-05-18 ENCOUNTER — Other Ambulatory Visit: Payer: Self-pay

## 2021-05-18 DIAGNOSIS — R590 Localized enlarged lymph nodes: Secondary | ICD-10-CM | POA: Diagnosis present

## 2021-05-18 DIAGNOSIS — R918 Other nonspecific abnormal finding of lung field: Secondary | ICD-10-CM | POA: Insufficient documentation

## 2021-05-18 DIAGNOSIS — J392 Other diseases of pharynx: Secondary | ICD-10-CM | POA: Insufficient documentation

## 2021-05-18 DIAGNOSIS — M898X8 Other specified disorders of bone, other site: Secondary | ICD-10-CM | POA: Diagnosis not present

## 2021-05-18 LAB — GLUCOSE, CAPILLARY: Glucose-Capillary: 87 mg/dL (ref 70–99)

## 2021-05-18 MED ORDER — FLUDEOXYGLUCOSE F - 18 (FDG) INJECTION
9.0000 | Freq: Once | INTRAVENOUS | Status: AC | PRN
  Administered 2021-05-18: 9.04 via INTRAVENOUS

## 2021-05-19 ENCOUNTER — Telehealth: Payer: Self-pay

## 2021-05-19 DIAGNOSIS — J392 Other diseases of pharynx: Secondary | ICD-10-CM

## 2021-05-19 DIAGNOSIS — Q742 Other congenital malformations of lower limb(s), including pelvic girdle: Secondary | ICD-10-CM

## 2021-05-19 NOTE — Telephone Encounter (Signed)
Orders placed for MRI and ENT ref based off of PET/CT per Dr Jayme Cloud. Nothing further needed.

## 2021-05-27 ENCOUNTER — Other Ambulatory Visit: Payer: Self-pay

## 2021-05-27 ENCOUNTER — Ambulatory Visit (HOSPITAL_COMMUNITY)
Admission: RE | Admit: 2021-05-27 | Discharge: 2021-05-27 | Disposition: A | Discharge status: Home / Self Care | Payer: Medicare PPO | Source: Ambulatory Visit | Attending: Pulmonary Disease | Admitting: Pulmonary Disease

## 2021-05-27 DIAGNOSIS — Q742 Other congenital malformations of lower limb(s), including pelvic girdle: Secondary | ICD-10-CM | POA: Diagnosis present

## 2021-05-27 IMAGING — MR MR PELVIS WO/W CM
8 series · 48 of 48 positions shown · IV contrast (gadavist)
Comparison: PET-CT [DATE]

CLINICAL DATA: Iliac crest abnormality seen on PET scan.

EXAM:
MRI PELVIS WITHOUT AND WITH CONTRAST
TECHNIQUE: Multiplanar multisequence MR imaging of the pelvis was performed
both before and after administration of intravenous contrast.
CONTRAST:  8mL GADAVIST GADOBUTROL 1 MMOL/ML IV SOLN

[Series 9: T1 · coronal · 4.0mm · 1.19mm/px · 6 of 45 slices shown (1 of 3)]
[im 1/45]
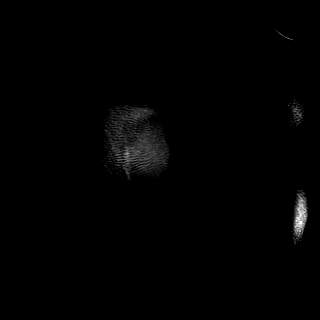
[im 9/45]
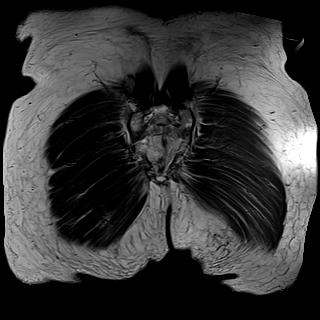
[im 18/45]
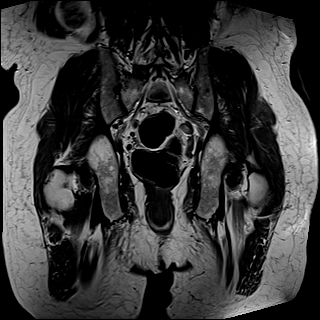
[im 27/45]
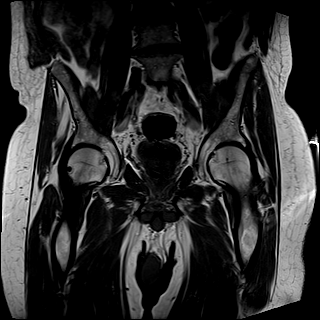
[im 36/45]
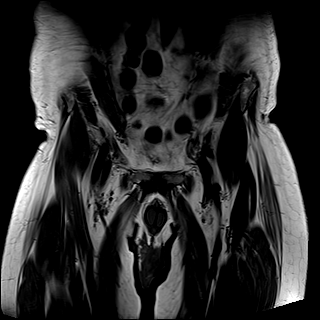
[im 45/45]
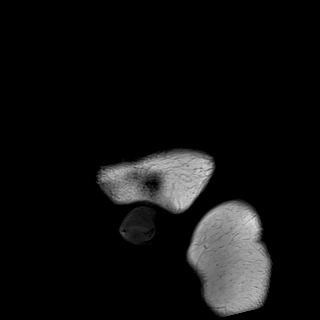

[Series 10: STIR · coronal · 4.0mm · 1.19mm/px · 6 of 44 slices shown]
[im 1/44]
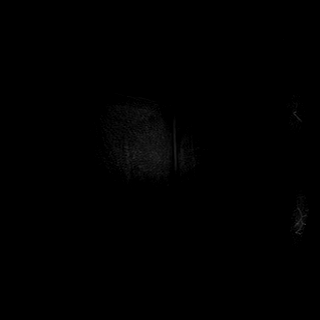
[im 9/44]
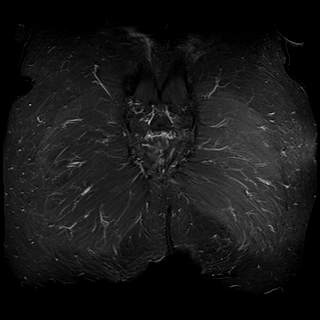
[im 18/44]
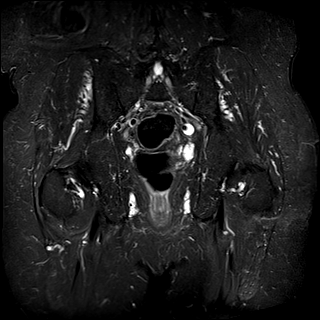
[im 26/44]
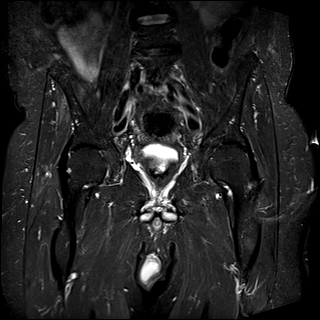
[im 35/44]
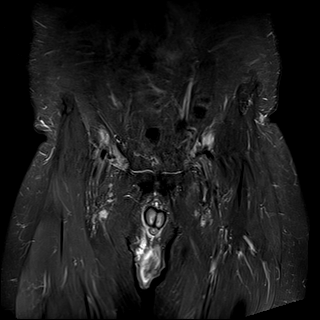
[im 44/44]
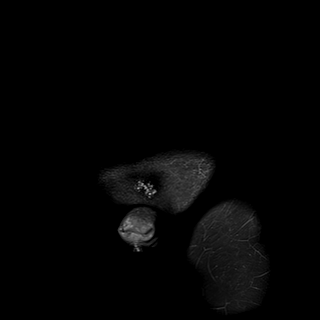

[Series 11: T1 · axial · 4.0mm · 0.94mm/px · z∈[-91,+169]mm · 7 of 53 slices shown (2 of 3)]
[im 1/53]
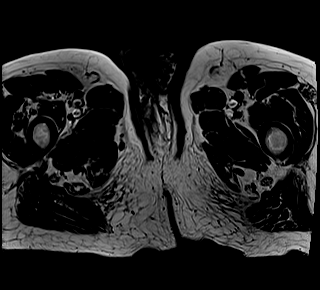
[im 9/53]
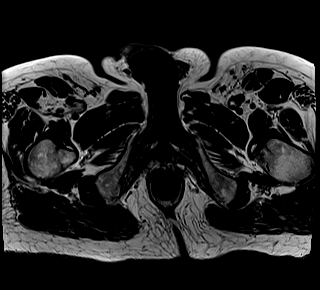
[im 18/53]
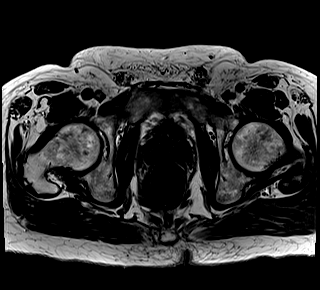
[im 27/53]
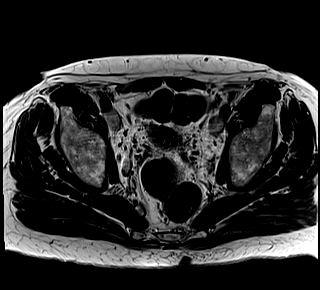
[im 35/53]
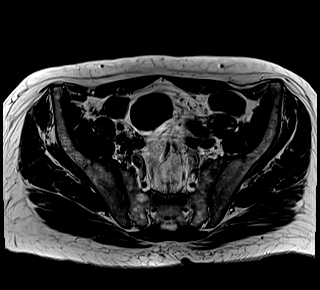
[im 44/53]
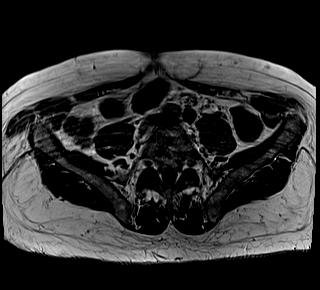
[im 53/53]
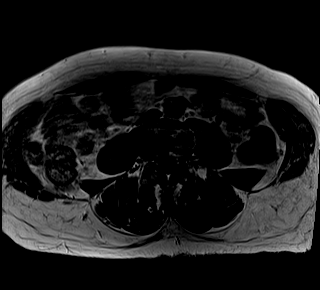

[Series 12: T2 fat-sat · axial · 4.0mm · 0.94mm/px · z∈[-91,+169]mm · 6 of 53 slices shown (1 of 2)]
[im 1/53]
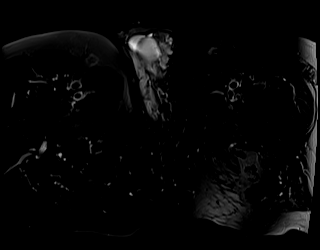
[im 11/53]
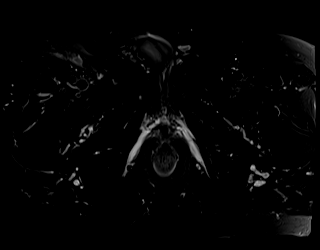
[im 21/53]
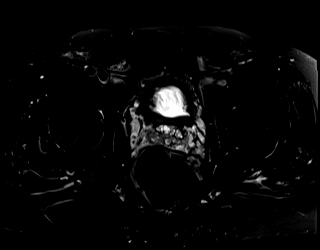
[im 32/53]
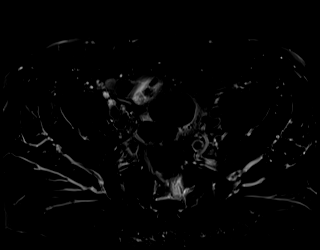
[im 42/53]
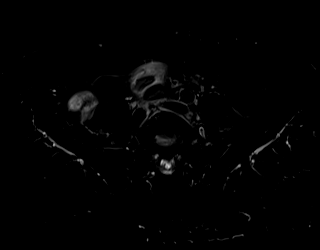
[im 53/53]
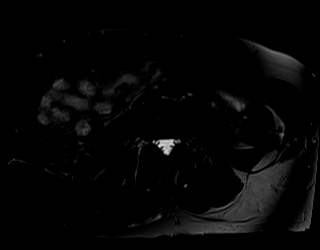

[Series 13: T2 fat-sat · sagittal · 5.0mm · 1.03mm/px · 6 of 51 slices shown (2 of 2)]
[im 1/51]
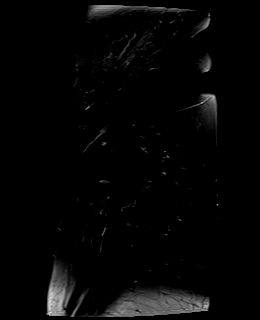
[im 11/51]
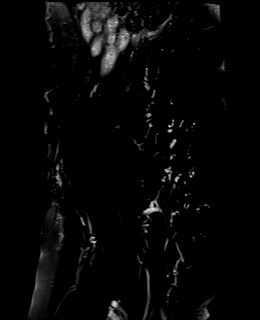
[im 21/51]
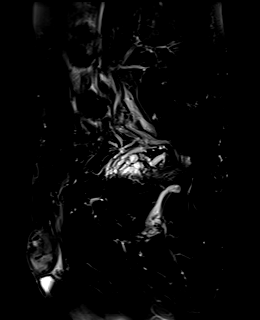
[im 31/51]
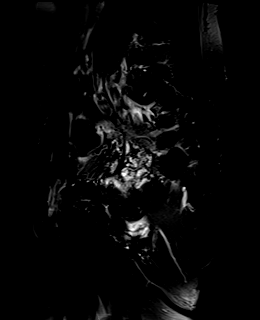
[im 41/51]
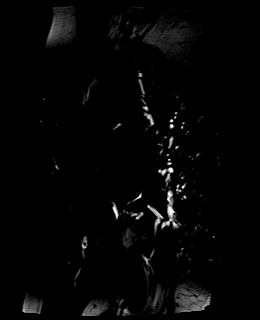
[im 51/51]
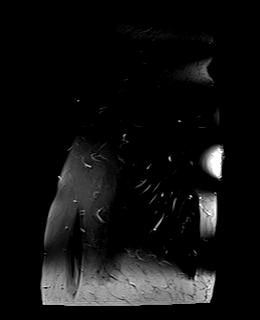

[Series 14: T1 · axial · non-contrast · 4.0mm · 0.94mm/px · z∈[-91,+169]mm · 6 of 53 slices shown (3 of 3)]
[im 1/53]
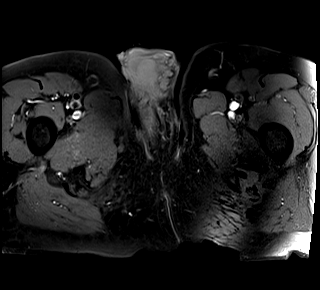
[im 11/53]
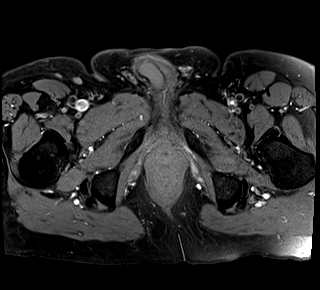
[im 21/53]
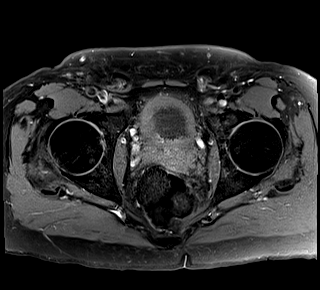
[im 32/53]
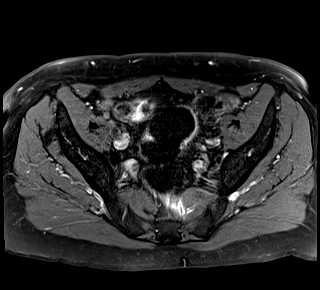
[im 42/53]
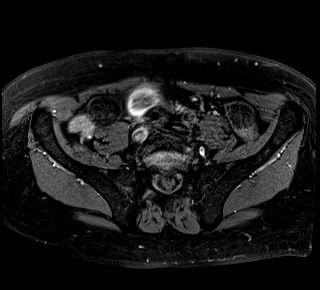
[im 53/53]
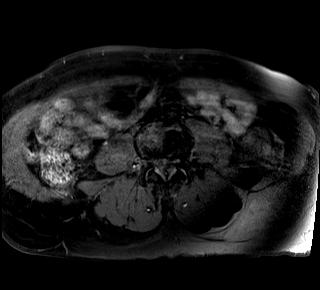

[Series 15: T1 post-contrast · axial · 4.0mm · 1.17mm/px · z∈[-91,+169]mm · 6 of 53 slices shown (1 of 2)]
[im 1/53]
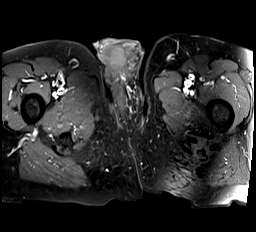
[im 11/53]
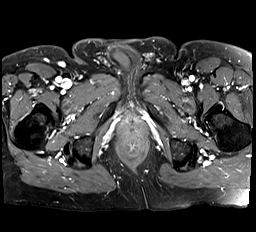
[im 21/53]
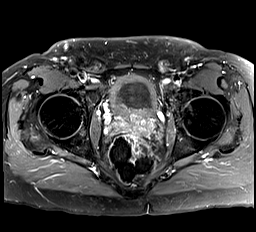
[im 32/53]
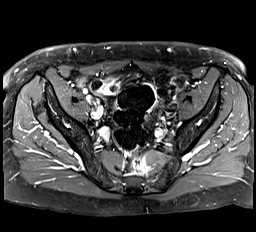
[im 42/53]
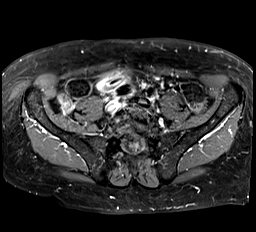
[im 53/53]
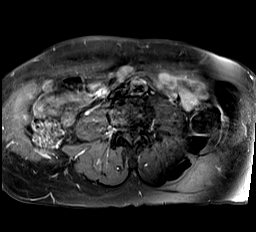

[Series 16: T1 post-contrast · coronal · 4.0mm · 1.19mm/px · 5 of 45 slices shown (2 of 2)]
[im 1/45]
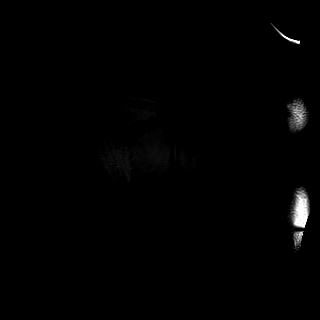
[im 12/45]
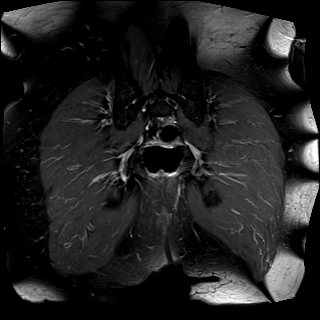
[im 23/45]
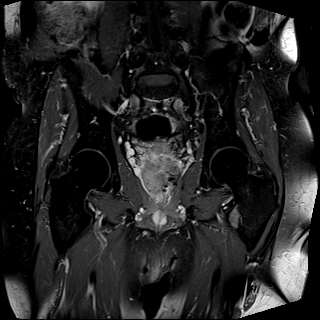
[im 34/45]
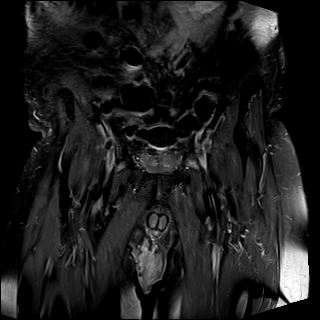
[im 45/45]
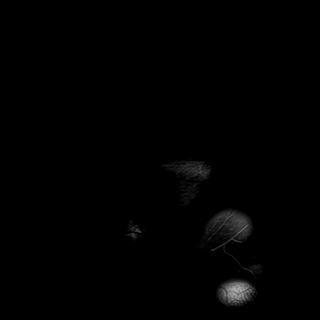

[48 of 48 positions shown; findings below may reference images not displayed]

FINDINGS: Bones:

No hip fracture, dislocation or avascular necrosis.

Abnormal T2 hyperintense and T1 hypointense bone lesion in the left
ilium measuring 2.5 x 1 x 2.9 cm with enhancement on postcontrast
imaging. Similar abnormal bone lesion is seen in the left lesser
trochanter and right inferior pubic ramus.

Normal sacrum and sacroiliac joints. No SI joint widening or erosive
changes.

Degenerative disease with disc height loss at L4-5.

Articular cartilage and labrum

Articular cartilage: Mild partial-thickness cartilage loss of
bilateral

Labrum: Grossly intact, but evaluation is limited by lack of
intraarticular fluid.

Joint or bursal effusion

Joint effusion:  No hip joint effusion.  No SI joint effusion.

Bursae:  No bursa formation.

Muscles and tendons

Flexors: Normal.

Extensors: Normal.

Abductors: Normal.

Adductors: Normal.

Gluteals: Normal.

Hamstrings: Normal.

Other findings

No pelvic free fluid. No fluid collection or hematoma. No inguinal
lymphadenopathy. No inguinal hernia.
IMPRESSION: 1. Abnormal bone lesion in the left ilium, right superior pubic
ramus, and left lesser trochanter concerning for metastatic disease
versus myeloma. Tissue sampling is recommended.

## 2021-05-27 MED ORDER — GADOBUTROL 1 MMOL/ML IV SOLN
8.0000 mL | Freq: Once | INTRAVENOUS | Status: AC | PRN
  Administered 2021-05-27: 8 mL via INTRAVENOUS

## 2021-05-28 ENCOUNTER — Other Ambulatory Visit: Payer: Self-pay

## 2021-05-28 DIAGNOSIS — Q742 Other congenital malformations of lower limb(s), including pelvic girdle: Secondary | ICD-10-CM

## 2021-05-29 ENCOUNTER — Other Ambulatory Visit: Payer: Self-pay | Admitting: Internal Medicine

## 2021-05-29 ENCOUNTER — Other Ambulatory Visit: Payer: Self-pay

## 2021-05-29 DIAGNOSIS — Q742 Other congenital malformations of lower limb(s), including pelvic girdle: Secondary | ICD-10-CM

## 2021-06-01 ENCOUNTER — Telehealth: Payer: Self-pay | Admitting: Pulmonary Disease

## 2021-06-01 NOTE — Telephone Encounter (Signed)
When I called Colin Barron to give him his CT biopsy appt which has been scheduled on 06/08/21. He wanted to wait and see Dr. Patsey Berthold after all the test were done.  His ENT appt is on 06/10/21.  Is there anyway that we can move his follow up appt that is currently scheduled on 06/09/21 with Dr. Patsey Berthold out after all the other test have be done.  This is per the patient's request

## 2021-06-01 NOTE — Telephone Encounter (Signed)
Spoke to patient and offered first available appt 07/20/2021. Patient stated that he would like to keep scheduled visit for 06/09/2021. Nothing further needed at this time.

## 2021-06-01 NOTE — Telephone Encounter (Signed)
That will be okay if there is anything that comes out of those 2 evaluations that he is having we will call him and make the appropriate referrals as needed.

## 2021-06-01 NOTE — Telephone Encounter (Signed)
Patient has pending appt with Dr. Patsey Berthold 06/09/2021. He would like to reschedule appt until after he sees ENT on 06/10/2021. CT bx scheduled 06/08/2021 First available with Dr. Patsey Berthold is not until 07/20/2021.  Dr. Patsey Berthold, please advise if this is too far out?

## 2021-06-07 NOTE — H&P (Signed)
Chief Complaint: Patient was seen in consultation today for left iliac bone lesion biopsy at the request of [place] L  Referring Physician(s): Tyler Pita  Supervising Physician: Sandi Mariscal  Patient Status: [place] - Out-pt  History of Present Illness: Colin Barron is a 76 y.o. male w/ PMH of arthritis, BPH, TIA, chronic granulomatous disease and mediastinal adenopathy. Pt had PET scan 05/18/21 which showed hypermetabolic mediastinal and hilar lymph nodes, hypermetabolic bilateral pulmonary nodules and uptake of left iliac bone. Follow-up MR pelvis 05/27/21 confirmed abnormal bone lesion in left ilium. Pt was referred to IR for left ilium bone lesion biopsy which was approved by Dr. Denna Haggard.   MR Pelvis 05/27/21: IMPRESSION: 1. Abnormal bone lesion in the left ilium, right superior pubic ramus, and left lesser trochanter concerning for metastatic disease versus myeloma. Tissue sampling is recommended.  Past Medical History:  Diagnosis Date   Arthritis    BPH (benign prostatic hyperplasia) 12/08/2016    Past Surgical History:  Procedure Laterality Date   BIOPSY PROSTATE     CHOLECYSTECTOMY     CYST REMOVAL NECK     TONSILLECTOMY      Allergies: Amoxicillin  Medications: Prior to Admission medications   Medication Sig Start Date End Date Taking? Authorizing Provider  aspirin EC 81 MG EC tablet Take 1 tablet (81 mg total) by mouth daily. Swallow whole. 02/09/21   Domenic Polite, MD  Cholecalciferol (VITAMIN D3 PO) Take by mouth daily.    [provider]  Polyethyl Glycol-Propyl Glycol (SYSTANE) 0.4-0.3 % SOLN Apply to eye daily.    [provider]     Family History  Problem Relation Age of Onset   Diabetes Mother    Hypertension Mother    Intracerebral hemorrhage Brother    Hypertension Brother    Stroke Maternal Grandmother    Stroke Maternal Grandfather    Hypertension Brother        POSS    Social History   Socioeconomic  History   Marital status: Divorced    Spouse name: Not on file   Number of children: 3   Years of education: 12   Highest education level: Bachelor's degree (e.g., BA, AB, BS)  Occupational History   Not on file  Tobacco Use   Smoking status: Never   Smokeless tobacco: Never  Vaping Use   Vaping Use: Never used  Substance and Sexual Activity   Alcohol use: No   Drug use: No   Sexual activity: Yes  Other Topics Concern   Not on file  Social History Narrative   Pt lives with his brother   Social Determinants of Health   Financial Resource Strain: Low Risk    Difficulty of Paying Living Expenses: Not hard at all  Food Insecurity: No Food Insecurity   Worried About Charity fundraiser in the Last Year: Never true   Arboriculturist in the Last Year: Never true  Transportation Needs: No Transportation Needs   Lack of Transportation (Medical): No   Lack of Transportation (Non-Medical): No  Physical Activity: Insufficiently Active   Days of Exercise per Week: 7 days   Minutes of Exercise per Session: 10 min  Stress: Stress Concern Present   Feeling of Stress : To some extent  Social Connections: Moderately Integrated   Frequency of Communication with Friends and Family: More than three times a week   Frequency of Social Gatherings with Friends and Family: More than three times a week  Attends Religious Services: More than 4 times per year   Active Member of Clubs or Organizations: Yes   Attends Archivist Meetings: More than 4 times per year   Marital Status: Divorced      Review of Systems: A 12 point ROS discussed and pertinent positives are indicated in the HPI above.  All other systems are negative.  Review of Systems  Constitutional:  Negative for appetite change, chills and fever.  HENT:  Negative for nosebleeds.   Eyes:  Negative for visual disturbance.  Respiratory:  Negative for cough and shortness of breath.   Cardiovascular:  Negative for chest  pain and leg swelling.  Gastrointestinal:  Negative for abdominal pain, blood in stool, nausea and vomiting.  Genitourinary:  Negative for hematuria.  Neurological:  Negative for dizziness, light-headedness and headaches.   Vital Signs: BP (!) 160/95    Pulse 74    Temp 97.6 F (36.4 C) (Oral)    Resp 17    Ht 5\' 11"  (1.803 m)    Wt 178 lb (80.7 kg)    SpO2 98%    BMI 24.83 kg/m   Physical Exam Constitutional:      Appearance: Normal appearance. He is not ill-appearing.  HENT:     Head: Normocephalic and atraumatic.     Mouth/Throat:     Mouth: Mucous membranes are dry.     Pharynx: Oropharynx is clear.  Eyes:     Extraocular Movements: Extraocular movements intact.     Pupils: Pupils are equal, round, and reactive to light.  Cardiovascular:     Rate and Rhythm: Normal rate and regular rhythm.     Pulses: Normal pulses.     Heart sounds: Normal heart sounds. No murmur heard.   No friction rub. No gallop.  Pulmonary:     Effort: Pulmonary effort is normal. No respiratory distress.     Breath sounds: Normal breath sounds. No stridor. No wheezing, rhonchi or rales.  Abdominal:     General: Bowel sounds are normal. There is no distension.     Tenderness: There is no abdominal tenderness. There is no guarding.  Musculoskeletal:     Right lower leg: No edema.     Left lower leg: No edema.  Skin:    General: Skin is warm and dry.  Neurological:     Mental Status: He is alert and oriented to person, place, and time.  Psychiatric:        Mood and Affect: Mood normal.        Behavior: Behavior normal.        Thought Content: Thought content normal.        Judgment: Judgment normal.    Imaging: MR PELVIS W WO CONTRAST  Result Date: 05/28/2021 CLINICAL DATA:  Iliac crest abnormality seen on PET scan. EXAM: MRI PELVIS WITHOUT AND WITH CONTRAST TECHNIQUE: Multiplanar multisequence MR imaging of the pelvis was performed both before and after administration of intravenous contrast.  CONTRAST:  83mL GADAVIST GADOBUTROL 1 MMOL/ML IV SOLN COMPARISON:  PET-CT 05/18/2021 FINDINGS: Bones: No hip fracture, dislocation or avascular necrosis. Abnormal T2 hyperintense and T1 hypointense bone lesion in the left ilium measuring 2.5 x 1 x 2.9 cm with enhancement on postcontrast imaging. Similar abnormal bone lesion is seen in the left lesser trochanter and right inferior pubic ramus. Normal sacrum and sacroiliac joints. No SI joint widening or erosive changes. 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 Articular cartilage and labrum Articular cartilage: Mild  partial-thickness cartilage loss of bilateral Labrum: Grossly intact, but evaluation is limited by lack of intraarticular fluid. Joint or bursal effusion Joint effusion:  No hip joint effusion.  No SI joint effusion. Bursae:  No bursa formation. Muscles and tendons Flexors: Normal. Extensors: Normal. Abductors: Normal. Adductors: Normal. Gluteals: Normal. Hamstrings: Normal. Other findings No pelvic free fluid. No fluid collection or hematoma. No inguinal lymphadenopathy. No inguinal hernia. IMPRESSION: 1. Abnormal bone lesion in the left ilium, right superior pubic ramus, and left lesser trochanter concerning for metastatic disease versus myeloma. Tissue sampling is recommended. Electronically Signed   By: Elige Ko M.D.   On: 05/28/2021 09:42   NM PET Image Initial (PI) Skull Base To Thigh (F-18 FDG)  Result Date: 05/18/2021 CLINICAL DATA:  Initial treatment strategy for mediastinal adenopathy. EXAM: NUCLEAR MEDICINE PET SKULL BASE TO THIGH TECHNIQUE: 9.04 mCi F-18 FDG was injected intravenously. Full-ring PET imaging was performed from the skull base to thigh after the radiotracer. CT data was obtained and used for attenuation correction and anatomic localization. Fasting blood glucose: 87 mg/dl COMPARISON:  Chest CT dated March 16, 2021 FINDINGS: Mediastinal blood pool activity: SUV max 2.4 Liver activity: SUV max NA NECK:  Nonspecific focal increased FDG uptake of the posterior nasopharynx 14 with SUV max of 10.3. No hypermetabolic lymph nodes seen in the neck Incidental CT findings: none CHEST: Enlarged hypermetabolic mediastinal and hilar lymph nodes, most of which are calcified, unchanged in size when compared with prior chest CT. Reference right lower paratracheal lymph node measuring 1.6 cm in short axis on series 4, image 66 with SUV max of 6.1. Reference left hilar lymph node measuring 1.4 cm in short axis on series 4, image 74 with SUV max of 5.8. Bilateral solid upper lobe pulmonary nodules with mild FDG uptake and peribronchovascular predominant distribution are unchanged in size when compared with prior chest CT. Reference solid nodule of the right upper lobe located on series 8 image 21 with SUV max of 2.7. Incidental CT findings: No pericardial effusion. Coronary artery calcifications of the LAD. Atherosclerotic disease of the thoracic aorta. ABDOMEN/PELVIS: No abnormal hypermetabolic activity within the liver, pancreas, adrenal glands, or spleen. No hypermetabolic lymph nodes in the abdomen or pelvis. Incidental CT findings: Cholelithiasis. Simple cyst of the right kidney. Bladder wall thickening. TURP defect of the prostate. Atherosclerotic disease of the abdominal aorta. SKELETON: Focal FDG uptake of the left iliac bone on series 4 image 146 with no definite associated lesion on CT an SUV max of 3.8. FDG uptake at the left greater than right acromioclavicular joints, likely degenerative. Incidental CT findings: none IMPRESSION: 1. Enlarged and hypermetabolic mediastinal and hilar lymph nodes, most of which are calcified, are unchanged in size when compared to prior exam. In the absence of known malignancy, favor benign process such as sarcoidosis or granulomatous disease given presence of calcifications. 2. Mildly hypermetabolic bilateral pulmonary nodules with an upper lobe and peribronchovascular predominant  distribution, findings can be seen the setting of sarcoidosis. Largest pulmonary nodule measures up to 7 mm, recommend follow-up chest CT in 3 months to ensure stability, then consider additional CT follow-up at 18-24 months. 3. Mild focal hypermetabolic uptake of the left iliac bone with no definite CT correlate. Recommend further evaluation with MSK protocol pelvic MRI with and without contrast. 4. Nonspecific focal increased FDG uptake at the posterior nasopharynx. Recommend ENT consultation for direct visual inspection. Electronically Signed   By: Allegra Lai M.D.   On: 05/18/2021 15:42  Labs:  CBC: Recent Labs    02/06/21 1404 04/25/21 1012 06/08/21 1000  WBC 5.8 7.8 5.9  HGB 14.0 13.4 15.3  HCT 41.7 40.0 45.2  PLT 194 124* 156    COAGS: Recent Labs    02/06/21 1404  INR 1.1  APTT 32    BMP: Recent Labs    02/06/21 1404 03/16/21 1441 04/25/21 1012  NA 138  --  136  K 3.9  --  3.3*  CL 102  --  102  CO2 29  --  24  GLUCOSE 153*  --  120*  BUN 9  --  8  CALCIUM 10.3  --  9.2  CREATININE 0.77 0.90 0.81  GFRNONAA >60  --  >60    LIVER FUNCTION TESTS: Recent Labs    02/06/21 1404  BILITOT 1.0  AST 17  ALT 11  ALKPHOS 67  PROT 7.8  ALBUMIN 4.3    TUMOR MARKERS: No results for input(s): AFPTM, CEA, CA199, CHROMGRNA in the last 8760 hours.  Assessment and Plan: History of arthritis, BPH, TIA, chronic granulomatous disease and mediastinal adenopathy. Pt had PET scan 05/18/21 which showed hypermetabolic mediastinal and hilar lymph nodes, hypermetabolic bilateral pulmonary nodules and uptake of left iliac bone. Follow-up MR pelvis 05/27/21 confirmed abnormal bone lesion in left ilium. Pt was referred to IR for left ilium bone lesion biopsy which was approved by Dr. Denna Haggard.   MR Pelvis 05/27/21: IMPRESSION: 1. Abnormal bone lesion in the left ilium, right superior pubic ramus, and left lesser trochanter concerning for metastatic disease versus myeloma. Tissue  sampling is recommended.  Pt resting on stretcher. He is A&O, calm and pleasant.  He is in no distress.   Pt states he is NPO per order.  Pt reports 81 mg ASA daily, but did not take today.  Risks and benefits of left iliac bone lesion biopsy was discussed with the patient and/or patient's family including, but not limited to bleeding, infection, damage to adjacent structures or low yield requiring additional tests.  All of the questions were answered and there is agreement to proceed.  Consent signed and in chart.   Thank you for this interesting consult.  I greatly enjoyed meeting Colin Barron and look forward to participating in their care.  A copy of this report was sent to the requesting provider on this date.  Electronically Signed: Tyson Alias, NP 06/08/2021, 10:15 AM   I spent a total of 20 minutes in face to face in clinical consultation, greater than 50% of which was counseling/coordinating care for left iliac bone lesion biopsy.

## 2021-06-08 ENCOUNTER — Other Ambulatory Visit: Payer: Self-pay

## 2021-06-08 ENCOUNTER — Ambulatory Visit
Admission: RE | Admit: 2021-06-08 | Discharge: 2021-06-08 | Disposition: A | Discharge status: Home / Self Care | Payer: Medicare PPO | Source: Ambulatory Visit | Attending: Pulmonary Disease | Admitting: Pulmonary Disease

## 2021-06-08 DIAGNOSIS — R590 Localized enlarged lymph nodes: Secondary | ICD-10-CM | POA: Insufficient documentation

## 2021-06-08 DIAGNOSIS — Z8673 Personal history of transient ischemic attack (TIA), and cerebral infarction without residual deficits: Secondary | ICD-10-CM | POA: Diagnosis not present

## 2021-06-08 DIAGNOSIS — Q742 Other congenital malformations of lower limb(s), including pelvic girdle: Secondary | ICD-10-CM | POA: Insufficient documentation

## 2021-06-08 DIAGNOSIS — R918 Other nonspecific abnormal finding of lung field: Secondary | ICD-10-CM | POA: Insufficient documentation

## 2021-06-08 DIAGNOSIS — M899 Disorder of bone, unspecified: Secondary | ICD-10-CM | POA: Diagnosis not present

## 2021-06-08 DIAGNOSIS — N400 Enlarged prostate without lower urinary tract symptoms: Secondary | ICD-10-CM | POA: Diagnosis not present

## 2021-06-08 LAB — CBC WITH DIFFERENTIAL/PLATELET
Abs Immature Granulocytes: 0.02 10*3/uL (ref 0.00–0.07)
Basophils Absolute: 0 10*3/uL (ref 0.0–0.1)
Basophils Relative: 0 %
Eosinophils Absolute: 0.1 10*3/uL (ref 0.0–0.5)
Eosinophils Relative: 2 %
HCT: 45.2 % (ref 39.0–52.0)
Hemoglobin: 15.3 g/dL (ref 13.0–17.0)
Immature Granulocytes: 0 %
Lymphocytes Relative: 26 %
Lymphs Abs: 1.5 10*3/uL (ref 0.7–4.0)
MCH: 31.2 pg (ref 26.0–34.0)
MCHC: 33.8 g/dL (ref 30.0–36.0)
MCV: 92.2 fL (ref 80.0–100.0)
Monocytes Absolute: 0.6 10*3/uL (ref 0.1–1.0)
Monocytes Relative: 11 %
Neutro Abs: 3.6 10*3/uL (ref 1.7–7.7)
Neutrophils Relative %: 61 %
Platelets: 156 10*3/uL (ref 150–400)
RBC: 4.9 MIL/uL (ref 4.22–5.81)
RDW: 12.3 % (ref 11.5–15.5)
WBC: 5.9 10*3/uL (ref 4.0–10.5)
nRBC: 0 % (ref 0.0–0.2)

## 2021-06-08 LAB — PROTIME-INR
INR: 1.1 (ref 0.8–1.2)
Prothrombin Time: 14 seconds (ref 11.4–15.2)

## 2021-06-08 IMAGING — CT CT BIOPSY
1 of 14 series · 1 of 14 positions shown, 2 images · non-contrast
Comparison: none

INDICATION: No known primary, now with hypermetabolic lesion involving the
posterior aspect of the left ilium. Please perform CT-guided biopsy
for tissue diagnostic purposes.

[Series 2: i-spiral 5.0 br38 · axial · 0.98mm/px · z∈[+936,+936]mm · 1 of 30 slices shown, 2 images]
[im 1/30  soft-tissue]
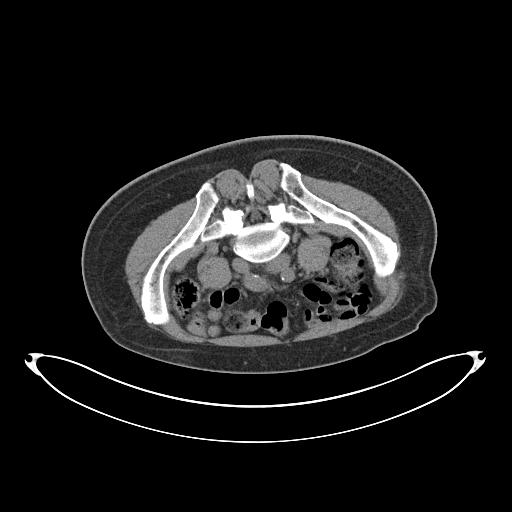
[im 1/30  bone]
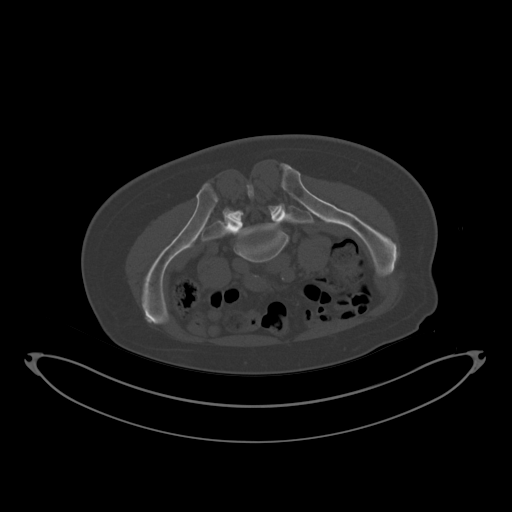

[1 of 14 positions shown; findings below may reference images not displayed]

EXAM:
CT-GUIDED BONE LESION BIOPSY

MEDICATIONS:
None

ANESTHESIA/SEDATION:
Moderate (conscious) sedation was employed during this procedure as
administered by the [REDACTED].

A total of Versed 1 mg and Fentanyl 50 mcg was administered
intravenously.

Moderate Sedation Time: 19 minutes. The patient's level of
consciousness and vital signs were monitored continuously by
radiology nursing throughout the procedure under my direct
supervision.

COMPLICATIONS:
None immediate.

PROCEDURE:
Informed consent was obtained from the patient following an
explanation of the procedure, risks, benefits and alternatives. The
patient understands, agrees and consents for the procedure. All
questions were addressed. A time out was performed prior to the
initiation of the procedure.

The patient was positioned prone and non-contrast localization CT
was performed of the pelvis to demonstrate the posterosuperior
aspect the left iliac crest. The area of abnormal hypermetabolic
activity and abnormal enhancement on preceding pelvic MRI was
correlated with the acquired CT images as there is no CT correlate
for this area of concern. The procedure was planned. The operative
site was prepped and draped in the usual sterile fashion.

Under sterile conditions and local anesthesia, a 22 gauge spinal
needle was utilized for procedural planning. Next, an 11 gauge
coaxial bone biopsy needle was advanced into the expected location
of the ill-defined lesion involving the posterosuperior aspect of
the left ilium. Next, core needle biopsy was obtained with the inner
13 gauge biopsy needle (image 5, series 10 and image 6, series 11).
An additional sample was then obtained with the 11 gauge outer bone
biopsy needle (image 6, series 12).

Next, the 11 gauge coaxial bone biopsy needle was advanced into the
more inferior aspect of the posterosuperior iliac crest and again a
13 gauge bone biopsy needle was utilized to acquire a core biopsy
sample (image 10, series 17). A final biopsy sample was then
obtained with the 11 gauge coaxial bone biopsy needle (image 10,
series 18).

Completion imaging was obtained and the procedure was terminated. A
dressing was applied. The patient tolerated the procedure well
without immediate post procedural complication.
IMPRESSION: Technically successful CT-guided biopsy of the posterosuperior
aspect of the left iliac crest correlating with the area of abnormal
hypermetabolic activity and enhancement on preceding PET-CT and
contrast-enhanced pelvic MRI.

## 2021-06-08 MED ORDER — FENTANYL CITRATE (PF) 100 MCG/2ML IJ SOLN
INTRAMUSCULAR | Status: AC
  Filled 2021-06-08: qty 2

## 2021-06-08 MED ORDER — MIDAZOLAM HCL 2 MG/2ML IJ SOLN
INTRAMUSCULAR | Status: AC | PRN
  Administered 2021-06-08: 1 mg via INTRAVENOUS

## 2021-06-08 MED ORDER — MIDAZOLAM HCL 2 MG/2ML IJ SOLN
INTRAMUSCULAR | Status: AC
  Filled 2021-06-08: qty 2

## 2021-06-08 MED ORDER — HEPARIN SOD (PORK) LOCK FLUSH 100 UNIT/ML IV SOLN
INTRAVENOUS | Status: AC
  Filled 2021-06-08: qty 5

## 2021-06-08 MED ORDER — FENTANYL CITRATE (PF) 100 MCG/2ML IJ SOLN
INTRAMUSCULAR | Status: AC | PRN
Start: 2021-06-08 — End: 2021-06-08
  Administered 2021-06-08: 50 ug via INTRAVENOUS

## 2021-06-08 NOTE — Procedures (Signed)
Pre procedural Dx: Indeterminate lesion within the left ilium.  Post procedural Dx: Same  Technically successful CT guided biopsy of ill defined lesion involving the posterior aspect of the left ilium.   EBL: None.   Complications: None immediate.   Katherina Right, MD Pager #: [phone]

## 2021-06-09 ENCOUNTER — Ambulatory Visit: Payer: Medicare PPO | Admitting: Pulmonary Disease

## 2021-06-09 ENCOUNTER — Encounter: Payer: Self-pay | Admitting: Pulmonary Disease

## 2021-06-09 VITALS — BP 116/74 | HR 76 | Temp 98.0°F | Ht 71.0 in | Wt 180.0 lb

## 2021-06-09 DIAGNOSIS — M899 Disorder of bone, unspecified: Secondary | ICD-10-CM

## 2021-06-09 DIAGNOSIS — R590 Localized enlarged lymph nodes: Secondary | ICD-10-CM | POA: Diagnosis not present

## 2021-06-09 NOTE — Patient Instructions (Signed)
We will call you with the results of your biopsy as soon as these are known.  We will call you with we will arrange for other referrals as needed pending the results of the biopsy.   We will see you in follow-up in 4 to 6 weeks time.  We have put in the order to the lab for the blood test to check for sarcoid you can have this drawn anytime between now and when you come back.

## 2021-06-09 NOTE — Progress Notes (Signed)
Subjective:    Patient ID: Colin Barron, male    DOB: 04-25-1946, 76 y.o.   MRN: [id number] Chief Complaint  Patient presents with   Follow-up   HPI Patient is a 76 year old lifelong never smoker who presents for follow-up on the issue of mediastinal adenopathy.  He was initially seen here on 07 May 2021 please refer to that note for the details.  Patient is totally asymptomatic with regards to his mediastinal adenopathy.  This was an incidental finding during an admission at [facility] in mid October 2022 due to a TIA.  The patient had undergone CT chest with contrast on 16 March 2021 which showed a cluster of lymph nodes in the left hilum measuring 2.5 x 3.1 cm, bilateral hilar and mediastinal adenopathy and areas of partial calcification of the lymph nodes.  The patient underwent PET/CT on 18 May 2021 which showed enlarged and hypermetabolic mediastinal and hilar lymph nodes most of which were calcified favoring a benign process such as sarcoidosis or granulomatous disease.  There were also mildly hypermetabolic bilateral pulmonary nodules with upper lobe predominance consistent with sarcoidosis.  However there was a hypermetabolic uptake in the left iliac bone with no definite CT correlate.  An MRI followed this which showed a lesion in the left ilium measuring 2.5 x 1 x 2.9 cm.  There were also similar lesions in the left lesser trochanter and right inferior pubic Barron.  These lesions are running for metastatic disease versus myeloma.  The patient underwent biopsy of the left ilium yesterday and results are still pending.  Patient continues to have no issues with night sweats, shortness of breath or cough.  No hemoptysis.  No chest pain.  No weight loss or anorexia.  We discussed that the mediastinal adenopathy and the findings on the ileum may not be related.  We also discussed that he may still need biopsy of the lymph nodes however he is not too enthusiastic about  undergoing general anesthesia for EBUS.   Review of Systems A 10 point review of systems was performed and it is as noted above otherwise negative.  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 Swallow whole.   Cholecalciferol (VITAMIN D3 PO) Take by mouth daily.   Polyethyl Glycol-Propyl Glycol (SYSTANE) 0.4-0.3 % SOLN Apply to eye daily.   Immunization History  Administered Date(s) Administered   Influenza, High Dose Seasonal PF 01/28/2017   Moderna Sars-Covid-2 Vaccination 06/04/2019, 07/04/2019      Objective:   Physical Exam BP 116/74 (BP Location: Left Arm, Patient Position: Sitting, Cuff Size: Normal)    Pulse 76    Temp 98 F (36.7 C) (Oral)    Ht 5\' 11"  (1.803 m)    Wt 180 lb (81.6 kg)  SpO2 100%    BMI 25.10 kg/m   GENERAL: Debilitated appearing gentleman, no acute distress.  Well-nourished well-developed, ambulates with assistance of a cane.  No respiratory distress, no conversational dyspnea. HEAD: Normocephalic, atraumatic.  EYES: Pupils equal, round, reactive to light.  No scleral icterus.  MOUTH: Nose/mouth/throat not examined due to masking requirements for COVID 19. NECK: Supple. No thyromegaly. Trachea midline. No JVD.  No adenopathy. PULMONARY: Good air entry bilaterally.  No adventitious  sounds. CARDIOVASCULAR: S1 and S2. Regular rate and rhythm.  No rubs, murmurs or gallops heard. ABDOMEN: Benign. MUSCULOSKELETAL: No joint deformity, no clubbing, no edema.  NEUROLOGIC: No focal deficit, no gait disturbance, speech is fluent. SKIN: Intact,warm,dry. PSYCH: Mood and behavior normal.    I reviewed all available imaging with the patient and his brother who is present with him today.  This included CT scans, PET/CT and MRI.     Assessment & Plan:     ICD-10-CM   1. Mediastinal adenopathy  R59.0 Angiotensin converting enzyme   Obtain ACE level Patient not enthusiastic about EBUS    2. Bone lesion - LEFT ilium   M89.9    Status postbiopsy yesterday Await path report Will call patient to report     Orders Placed This Encounter  Procedures   Angiotensin converting enzyme    Standing Status:   Future    Standing Expiration Date:   06/09/2022   We will see the patient in follow-up in 4 to 6 weeks time.  In the interim we will let him know the results of his biopsy as soon as these are known.  We will call him and arrange for other referrals as needed pending the results of the biopsy.  We discussed the potential EBUS/TBNA to diagnose mediastinal adenopathy however the patient currently is not too enthusiastic about undergoing this.  Gailen Shelter, MD Advanced Bronchoscopy P[facility]    *This note was dictated using voice recognition software/Dragon.  Despite best efforts to proofread, errors can occur which can change the meaning. Any transcriptional errors that result from this process are unintentional and may not be fully corrected at the time of dictation.

## 2021-06-10 LAB — SURGICAL PATHOLOGY

## 2021-06-22 ENCOUNTER — Telehealth: Payer: Self-pay | Admitting: Pulmonary Disease

## 2021-06-22 NOTE — Telephone Encounter (Signed)
Lm for patient.  

## 2021-06-23 NOTE — Telephone Encounter (Signed)
Spoke to patient. He is requesting CT bx results. He is aware that Dr. Patsey Berthold is unavailable until 06/24/2021  Dr. Patsey Berthold, please advise. Thanks

## 2021-06-23 NOTE — Telephone Encounter (Signed)
Lm x2 for patient. Will call once more due to nature of call.   

## 2021-06-24 MED ORDER — PREDNISONE 20 MG PO TABS
20.0000 mg | ORAL_TABLET | Freq: Every day | ORAL | 0 refills | Status: DC
Start: 2021-06-24 — End: 2021-07-21

## 2021-06-24 MED ORDER — SULFAMETHOXAZOLE-TRIMETHOPRIM 800-160 MG PO TABS: ORAL_TABLET | ORAL | 0 refills | Status: DC

## 2021-06-24 NOTE — Telephone Encounter (Signed)
Yes, it has finally been released.  Bone takes longer to process due to the fact that they have to dissolve the bone to get to the tissue.  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  So we will need to start him on some prednisone and I recommend 20 mg daily.  This he will take the same dose until he sees his again which I think it is 4 to 6 weeks.  He will also need to take a Bactrim DS tablet (if he is not allergic to sulfa) 1 tablet Monday Wednesday and Friday to prevent infection while on the prednisone.  This is consistent with the findings on his lung. ?

## 2021-06-24 NOTE — Telephone Encounter (Signed)
Spoke to patient and relayed results. He voiced his understanding and had no further questions.  ?Prednisone and Bactrim sent to preferred pharmacy. ?Nothing further needed.  ?

## 2021-06-26 ENCOUNTER — Other Ambulatory Visit
Admission: RE | Admit: 2021-06-26 | Discharge: 2021-06-26 | Disposition: A | Discharge status: Home / Self Care | Payer: Medicare PPO | Attending: Pulmonary Disease | Admitting: Pulmonary Disease

## 2021-06-26 DIAGNOSIS — R590 Localized enlarged lymph nodes: Secondary | ICD-10-CM | POA: Insufficient documentation

## 2021-06-27 LAB — ANGIOTENSIN CONVERTING ENZYME: Angiotensin-Converting Enzyme: 64 U/L (ref 14–82)

## 2021-07-02 ENCOUNTER — Telehealth: Payer: Self-pay | Admitting: Physician Assistant

## 2021-07-02 NOTE — Progress Notes (Unsigned)
Patient ID: Colin Barron, male   DOB: Oct 27, 1945, 76 y.o.   MRN: [id number] ? ?Re: Coding Query ? ? ?This patient underwent a deep bone biopsy. ? ?Per IR anticoagulation guidelines, this patient falls in the high risk category for bleeding risk. ? ?INR is routine lab for pre-procedure evaluation. ? ? ?Gwynneth Macleod PA-C ?07/02/2021 ?11:58 AM ? ? ? ? ?

## 2021-07-21 ENCOUNTER — Other Ambulatory Visit: Payer: Self-pay | Admitting: Pulmonary Disease

## 2021-07-24 ENCOUNTER — Other Ambulatory Visit
Admission: RE | Admit: 2021-07-24 | Discharge: 2021-07-24 | Disposition: A | Discharge status: Home / Self Care | Payer: Medicare PPO | Attending: Pulmonary Disease | Admitting: Pulmonary Disease

## 2021-07-24 ENCOUNTER — Encounter: Payer: Self-pay | Admitting: Pulmonary Disease

## 2021-07-24 ENCOUNTER — Ambulatory Visit (INDEPENDENT_AMBULATORY_CARE_PROVIDER_SITE_OTHER): Payer: Medicare PPO | Admitting: Pulmonary Disease

## 2021-07-24 VITALS — BP 122/82 | HR 71 | Temp 97.1°F | Ht 71.0 in | Wt 179.0 lb

## 2021-07-24 DIAGNOSIS — Z5181 Encounter for therapeutic drug level monitoring: Secondary | ICD-10-CM | POA: Diagnosis present

## 2021-07-24 DIAGNOSIS — D869 Sarcoidosis, unspecified: Secondary | ICD-10-CM | POA: Diagnosis present

## 2021-07-24 DIAGNOSIS — M899 Disorder of bone, unspecified: Secondary | ICD-10-CM | POA: Diagnosis not present

## 2021-07-24 LAB — CBC WITH DIFFERENTIAL/PLATELET
Abs Immature Granulocytes: 0.05 10*3/uL (ref 0.00–0.07)
Basophils Absolute: 0 10*3/uL (ref 0.0–0.1)
Basophils Relative: 0 %
Eosinophils Absolute: 0.1 10*3/uL (ref 0.0–0.5)
Eosinophils Relative: 1 %
HCT: 45.1 % (ref 39.0–52.0)
Hemoglobin: 15.3 g/dL (ref 13.0–17.0)
Immature Granulocytes: 1 %
Lymphocytes Relative: 11 %
Lymphs Abs: 1 10*3/uL (ref 0.7–4.0)
MCH: 31.8 pg (ref 26.0–34.0)
MCHC: 33.9 g/dL (ref 30.0–36.0)
MCV: 93.8 fL (ref 80.0–100.0)
Monocytes Absolute: 0.6 10*3/uL (ref 0.1–1.0)
Monocytes Relative: 7 %
Neutro Abs: 7.3 10*3/uL (ref 1.7–7.7)
Neutrophils Relative %: 80 %
Platelets: 155 10*3/uL (ref 150–400)
RBC: 4.81 MIL/uL (ref 4.22–5.81)
RDW: 12.5 % (ref 11.5–15.5)
WBC: 9.1 10*3/uL (ref 4.0–10.5)
nRBC: 0 % (ref 0.0–0.2)

## 2021-07-24 LAB — COMPREHENSIVE METABOLIC PANEL
ALT: 17 U/L (ref 0–44)
AST: 16 U/L (ref 15–41)
Albumin: 4.1 g/dL (ref 3.5–5.0)
Alkaline Phosphatase: 60 U/L (ref 38–126)
Anion gap: 8 (ref 5–15)
BUN: 12 mg/dL (ref 8–23)
CO2: 29 mmol/L (ref 22–32)
Calcium: 9.3 mg/dL (ref 8.9–10.3)
Chloride: 101 mmol/L (ref 98–111)
Creatinine, Ser: 0.96 mg/dL (ref 0.61–1.24)
GFR, Estimated: >60 mL/min (ref >60)
Glucose, Bld: 88 mg/dL (ref 70–99)
Potassium: 4 mmol/L (ref 3.5–5.1)
Sodium: 138 mmol/L (ref 135–145)
Total Bilirubin: 1 mg/dL (ref 0.3–1.2)
Total Protein: 7.3 g/dL (ref 6.5–8.1)

## 2021-07-24 MED ORDER — PREDNISONE 10 MG PO TABS: 10.0000 mg | ORAL_TABLET | Freq: Every day | ORAL | 3 refills | Status: DC

## 2021-07-24 MED ORDER — METHOTREXATE 2.5 MG PO TABS: 7.5000 mg | ORAL_TABLET | ORAL | 3 refills | Status: AC

## 2021-07-24 NOTE — Progress Notes (Signed)
? ?Subjective:  ? ? Patient ID: Colin Barron, male    DOB: 03-18-1946, 76 y.o.   MRN: [id number] ?Patient Care Team: ?Berniece Salines, FNP as PCP - General (Nurse Practitioner) ? ?Chief Complaint  ?Patient presents with  ? Follow-up  ?  Recent diagnosis of sarcoidosis with bone involvement.  ? ?HPI ?Patient is a 76 year old lifelong never smoker who presents for follow-up on the issue of mediastinal adenopathy.  He was last seen here on 09 June 2021 please refer to that note for the details. Patient is totally asymptomatic with regards to his mediastinal adenopathy.  This was an incidental finding during an admission at [facility] in mid October 2022 due to a TIA.  The patient had undergone CT chest with contrast on 16 March 2021 which showed a cluster of lymph nodes in the left hilum measuring 2.5 x 3.1 cm, bilateral hilar and mediastinal adenopathy and areas of partial calcification of the lymph nodes.  The patient underwent PET/CT on 18 May 2021 which showed enlarged and hypermetabolic mediastinal and hilar lymph nodes most of which were calcified favoring a benign process such as sarcoidosis or granulomatous disease.  There were also mildly hypermetabolic bilateral pulmonary nodules with upper lobe predominance consistent with sarcoidosis.  However there was a hypermetabolic uptake in the left iliac bone with no definite CT correlate.  An MRI followed this which showed a lesion in the left ilium measuring 2.5 x 1 x 2.9 cm.  There were also similar lesions in the left lesser trochanter and right inferior pubic ramus.  These lesions were concerning for metastatic disease versus myeloma.  The patient underwent biopsy of the left ilium on 13 February showing nonnecrotizing granulomas consistent with sarcoid granulomas, AFB and fungal stains negative.  Patient since have been placed on prednisone at 20 mg daily with Bactrim DS 3 times a week.  He completed his prednisone 20 mg today.  He  presents for follow-up after results were known of the bone biopsy.  He is still having some left hip pain but this is improving somewhat.  He does not endorse any shortness of breath, fevers, chills or sweats. ? ?Because of bone involvement by his sarcoidosis we will need to add a steroid sparing agent.  The patient presented today with his brother.  He discussed additional therapy for his sarcoidosis.  We discussed 2 potential occasions that being methotrexate and hydroxychloroquine (Plaquenil).  After discussion of the pros and cons of both of them the patient has opted for methotrexate.  Is aware that he will need close monitoring of his hepatic function while on this medication.  ? ?The patient does not endorse any other complaint today.  He seems somewhat aggravated by the fact that he needs to take medication. ? ?Review of Systems ?A 10 point review of systems was performed and it is as noted above otherwise negative. ? ?Patient Active Problem List  ? Diagnosis Date Noted  ? Mediastinal adenopathy 02/11/2021  ? Pyuria 02/07/2021  ? TIA (transient ischemic attack) 02/06/2021  ? Subconjunctival hemorrhage of right eye 12/21/2019  ? Seborrheic keratoses 12/21/2019  ? Aortic atherosclerosis (HCC) 11/20/2018  ? 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 BPH (benign prostatic hyperplasia) 12/08/2016  ? ?Social History  ? ?Tobacco Use  ? Smoking status: Never  ? Smokeless tobacco: Never  ?Substance Use Topics  ? Alcohol use: No  ? ?Allergies  ?Allergen Reactions  ? Amoxicillin Other (See Comments)  ?  Tolerated  Rocephin IV on 04/25/2021 ? ?  ? ?Current Meds  ?Medication Sig  ? aspirin EC 81 MG EC tablet Take 1 tablet (81 mg total) by mouth daily. Swallow whole.  ? Cholecalciferol (VITAMIN D3 PO) Take by mouth daily.  ? Polyethyl Glycol-Propyl Glycol (SYSTANE) 0.4-0.3 % SOLN Apply to eye daily.  ? TURMERIC PO Take by mouth.  ? VITAMIN D PO Take by mouth. D3  ? [DISCONTINUED] predniSONE (DELTASONE) 20 MG tablet TAKE  1 TABLET BY MOUTH DAILY WITH BREAKFAST  ? [DISCONTINUED] sulfamethoxazole-trimethoprim (BACTRIM DS) 800-160 MG tablet 1 tablet M,W,F  ? ?Immunization History  ?Administered Date(s) Administered  ? Influenza, High Dose Seasonal PF 01/28/2017  ? Moderna Sars-Covid-2 Vaccination 06/04/2019, 07/04/2019  ? ?   ?Objective:  ? Physical Exam ?BP 122/82 (BP Location: Left Arm, Patient Position: Sitting, Cuff Size: Normal)   Pulse 71   Temp (!) 97.1 ?F (36.2 ?C) (Oral)   Ht 5\' 11"  (1.803 m)   Wt 179 lb (81.2 kg)   SpO2 100%   BMI 24.97 kg/m?  ?GENERAL: Frail appearing gentleman, no acute distress.  Well-nourished well-developed, ambulates with assistance of a cane.  No respiratory distress, no conversational dyspnea. ?HEAD: Normocephalic, atraumatic.  ?EYES: Pupils equal, round, reactive to light.  No scleral icterus.  ?MOUTH: Nose/mouth/throat not examined due to masking requirements for COVID 19. ?NECK: Supple. No thyromegaly. Trachea midline. No JVD.  No adenopathy. ?PULMONARY: Good air entry bilaterally.  No adventitious sounds. ?CARDIOVASCULAR: S1 and S2. Regular rate and rhythm.  No rubs, murmurs or gallops heard. ?ABDOMEN: Benign. ?MUSCULOSKELETAL: No joint deformity, no clubbing, no edema.  ?NEUROLOGIC: No focal deficit, no gait disturbance, speech is fluent. ?SKIN: Intact,warm,dry. ?PSYCH: Mood and behavior normal. ? ?Recent Results (from the past 2160 hour(s))  ?Glucose, capillary     Status: None  ? Collection Time: 05/18/21  8:04 AM  ?Result Value Ref Range  ? Glucose-Capillary 87 70 - 99 mg/dL  ?  Comment: Glucose reference range applies only to samples taken after fasting for at least 8 hours.  ?CBC with Differential/Platelet     Status: None  ? Collection Time: 06/08/21 10:00 AM  ?Result Value Ref Range  ? WBC 5.9 4.0 - 10.5 K/uL  ? RBC 4.90 4.22 - 5.81 MIL/uL  ? Hemoglobin 15.3 13.0 - 17.0 g/dL  ? HCT 45.2 39.0 - 52.0 %  ? MCV 92.2 80.0 - 100.0 fL  ? MCH 31.2 26.0 - 34.0 pg  ? MCHC 33.8 30.0 - 36.0 g/dL   ? RDW 12.3 11.5 - 15.5 %  ? Platelets 156 150 - 400 K/uL  ? nRBC 0.0 0.0 - 0.2 %  ? Neutrophils Relative % 61 %  ? Neutro Abs 3.6 1.7 - 7.7 K/uL  ? Lymphocytes Relative 26 %  ? Lymphs Abs 1.5 0.7 - 4.0 K/uL  ? Monocytes Relative 11 %  ? Monocytes Absolute 0.6 0.1 - 1.0 K/uL  ? Eosinophils Relative 2 %  ? Eosinophils Absolute 0.1 0.0 - 0.5 K/uL  ? Basophils Relative 0 %  ? Basophils Absolute 0.0 0.0 - 0.1 K/uL  ? Immature Granulocytes 0 %  ? Abs Immature Granulocytes 0.02 0.00 - 0.07 K/uL  ?  Comment: Performed at [facility], [address] [place], [place]  ?Protime-INR     Status: None  ? Collection Time: 06/08/21 10:00 AM  ?Result Value Ref Range  ? Prothrombin Time 14.0 11.4 - 15.2 seconds  ? INR 1.1 0.8 - 1.2  ?  Comment: (NOTE) ?INR goal varies based on device and disease states. ?Performed at [facility], [address] [place], ?[place] [id number] ?  ?Surgical pathology     Status: None  ? Collection Time: 06/08/21 11:50 AM  ?Result Value Ref Range  ? SURGICAL PATHOLOGY    ?  SURGICAL PATHOLOGY ?~~~~ THIS IS AN ADDENDUM REPORT ~~~ ?CASE: [id number] ?PATIENT: Delmer Audino ?Surgical Pathology Report ?~~~~~Addendum ~~~~ ? ?Reason for Addendum #1:  Additional special stains ? ?Specimen Submitted: ?A. Bone, biopsy ? ?Clinical History: Indeterminate hypermetabolic and enhancing lesion ?involving the posterosuperior aspect of the left iliac crest. ?PET/CT shows enlarged mediastinal and hilar lymph nodes and bilateral ?small lung nodules, benign process is favored. ? ? ? ? ?DIAGNOSIS: ?A. BONE, LEFT ILIAC CREST; IMAGE-GUIDED CORE BIOPSY: ?- NON-NECROTIZING GRANULOMAS, SEE COMMENT. ?- NEGATIVE FOR MALIGNANCY. ? ?Comment: ?Two fragments in the section of clot material contain several small ?well-formed granulomas without necrosis. The morphology is consistent ?with sarcoid granulomas. ?Special stains for acid-fast bacilli and fungi will be reviewed and the ?results reported in  an addendum. ?In the intact core the bone marrow is normocellular with tr ilineage ?hematopoiesis. ? ?GROSS DESCRIPTION: ?A. Labeled: Received labeled with the patient's name and date of birth ?(per requisition

## 2021-07-24 NOTE — Patient Instructions (Signed)
Do not take the sulfa antibiotic anymore. ? ?Sent in prescriptions for the prednisone and the methotrexate to the [facility] at [facility] as requested. ? ?You will need a blood test today this is just so that we can keep tabs of your liver function to make sure you are tolerating the medication well. ? ?We will see you in follow-up in 4 to 6 weeks he will either see me or the nurse practitioner at that time. ?

## 2021-08-20 ENCOUNTER — Other Ambulatory Visit: Payer: Self-pay | Admitting: Pulmonary Disease

## 2021-08-24 ENCOUNTER — Encounter (HOSPITAL_COMMUNITY): Payer: Self-pay | Admitting: Emergency Medicine

## 2021-08-24 ENCOUNTER — Emergency Department (HOSPITAL_COMMUNITY)
Admission: EM | Admit: 2021-08-24 | Discharge: 2021-08-25 | Disposition: A | Discharge status: Home / Self Care | Payer: Medicare PPO | Attending: Emergency Medicine | Admitting: Emergency Medicine

## 2021-08-24 ENCOUNTER — Emergency Department (HOSPITAL_COMMUNITY): Payer: Medicare PPO

## 2021-08-24 ENCOUNTER — Other Ambulatory Visit: Payer: Self-pay

## 2021-08-24 DIAGNOSIS — S060X0A Concussion without loss of consciousness, initial encounter: Secondary | ICD-10-CM | POA: Insufficient documentation

## 2021-08-24 DIAGNOSIS — R531 Weakness: Secondary | ICD-10-CM | POA: Diagnosis not present

## 2021-08-24 DIAGNOSIS — Z7982 Long term (current) use of aspirin: Secondary | ICD-10-CM | POA: Insufficient documentation

## 2021-08-24 DIAGNOSIS — S0990XA Unspecified injury of head, initial encounter: Secondary | ICD-10-CM | POA: Diagnosis present

## 2021-08-24 DIAGNOSIS — X58XXXA Exposure to other specified factors, initial encounter: Secondary | ICD-10-CM | POA: Diagnosis not present

## 2021-08-24 LAB — URINALYSIS, ROUTINE W REFLEX MICROSCOPIC
Bacteria, UA: NONE SEEN
Bilirubin Urine: NEGATIVE
Glucose, UA: NEGATIVE mg/dL
Hgb urine dipstick: NEGATIVE
Ketones, ur: NEGATIVE mg/dL
Leukocytes,Ua: NEGATIVE
Nitrite: NEGATIVE
Protein, ur: NEGATIVE mg/dL
Specific Gravity, Urine: 1.008 (ref 1.005–1.030)
pH: 7 (ref 5.0–8.0)

## 2021-08-24 LAB — CBC
HCT: 44.7 % (ref 39.0–52.0)
Hemoglobin: 15.5 g/dL (ref 13.0–17.0)
MCH: 32.4 pg (ref 26.0–34.0)
MCHC: 34.7 g/dL (ref 30.0–36.0)
MCV: 93.3 fL (ref 80.0–100.0)
Platelets: 173 10*3/uL (ref 150–400)
RBC: 4.79 MIL/uL (ref 4.22–5.81)
RDW: 12.2 % (ref 11.5–15.5)
WBC: 9.5 10*3/uL (ref 4.0–10.5)
nRBC: 0 % (ref 0.0–0.2)

## 2021-08-24 LAB — BASIC METABOLIC PANEL
Anion gap: 8 (ref 5–15)
BUN: 7 mg/dL — ABNORMAL LOW (ref 8–23)
CO2: 28 mmol/L (ref 22–32)
Calcium: 9.5 mg/dL (ref 8.9–10.3)
Chloride: 102 mmol/L (ref 98–111)
Creatinine, Ser: 0.83 mg/dL (ref 0.61–1.24)
GFR, Estimated: >60 mL/min (ref >60)
Glucose, Bld: 98 mg/dL (ref 70–99)
Potassium: 3.8 mmol/L (ref 3.5–5.1)
Sodium: 138 mmol/L (ref 135–145)

## 2021-08-24 IMAGING — CT CT HEAD W/O CM
4 series · 16 of 47 positions shown, 18 images · non-contrast
Comparison: [DATE]

CLINICAL DATA: Head trauma.



[Series 3: head bone · axial · 0.48mm/px · z∈[-117,-85]mm · 3 of 79 slices shown]
[im 8/79  bone]
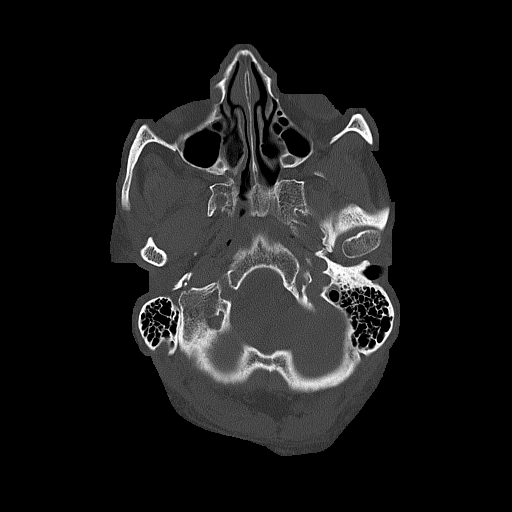
[im 16/79  bone]
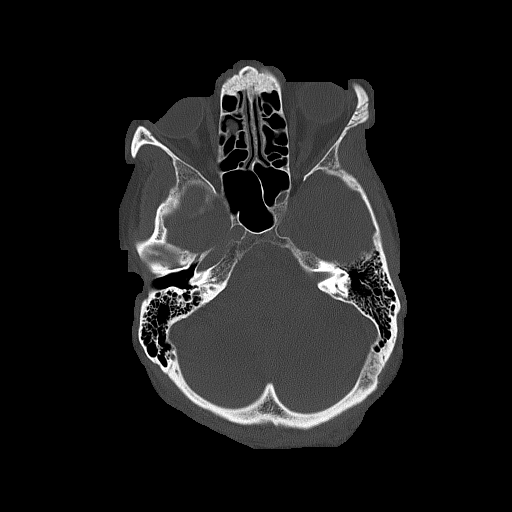
[im 24/79  bone]
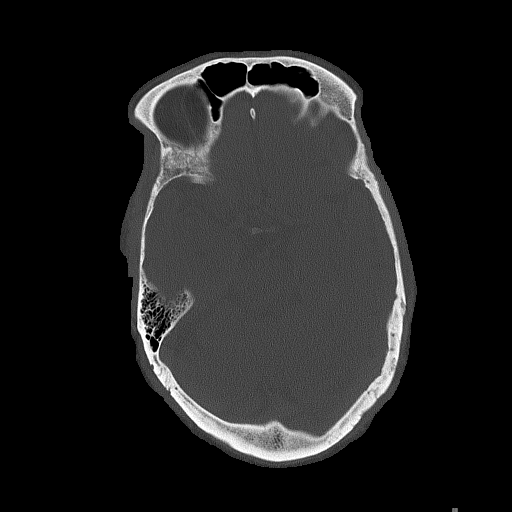

[Series 4: head without · axial · non-contrast · 0.48mm/px · z∈[-116,+4]mm · 7 of 32 slices shown, 9 images]
[im 4/32  brain]
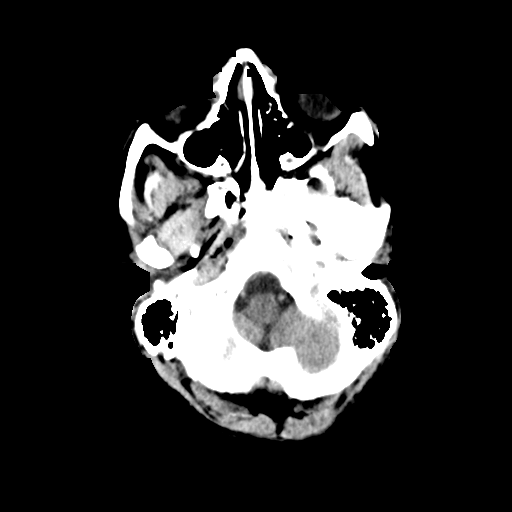
[im 4/32  bone]
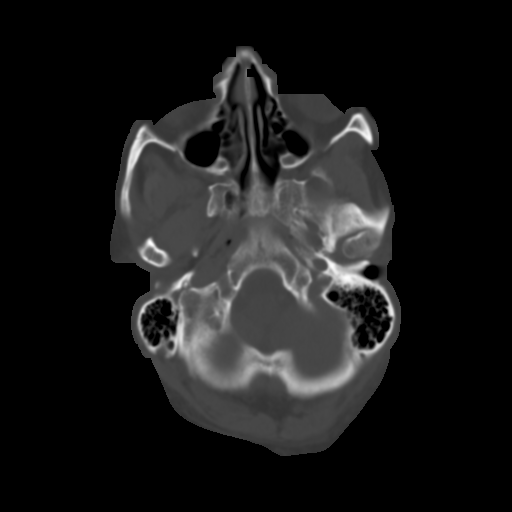
[im 8/32  brain]
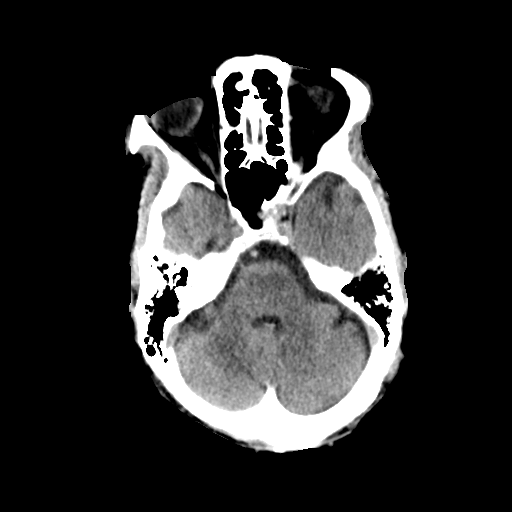
[im 12/32  brain]
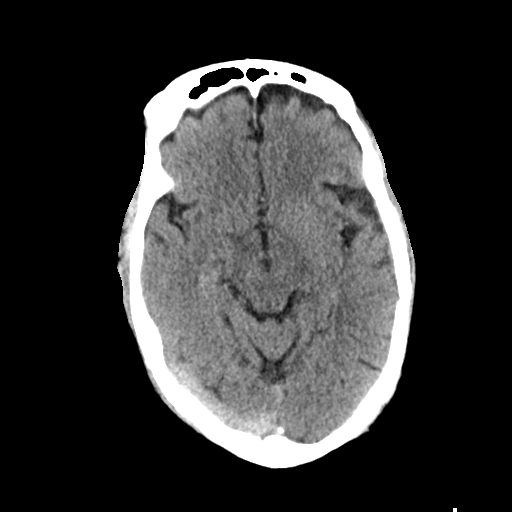
[im 16/32  brain]
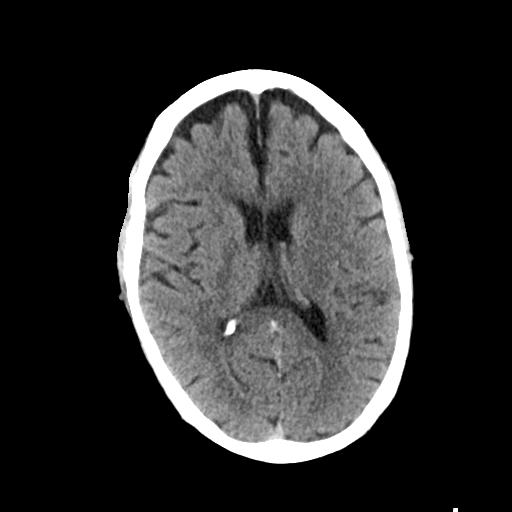
[im 20/32  brain]
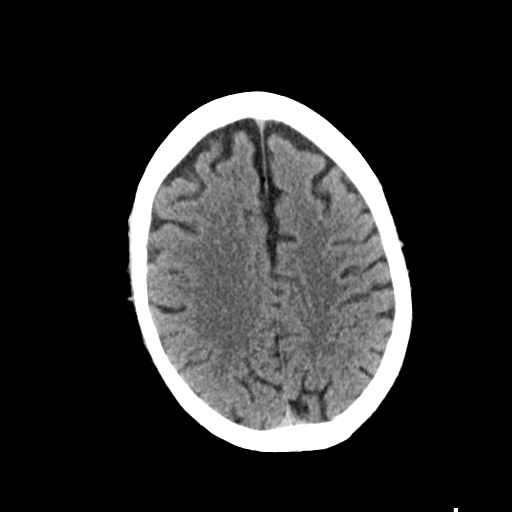
[im 20/32  bone]
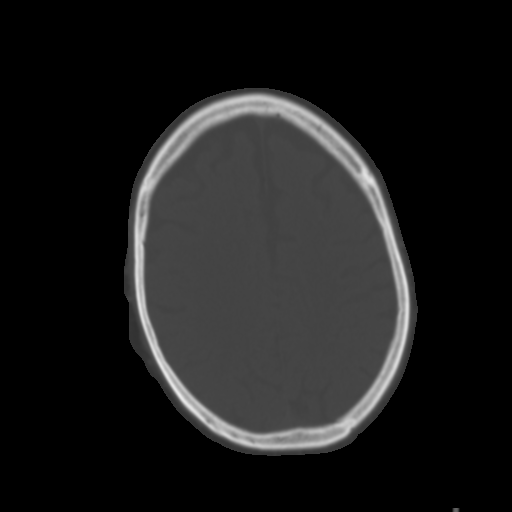
[im 24/32  brain]
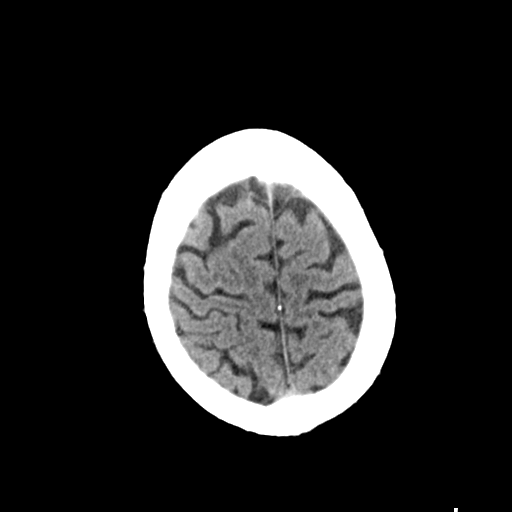
[im 28/32  brain]
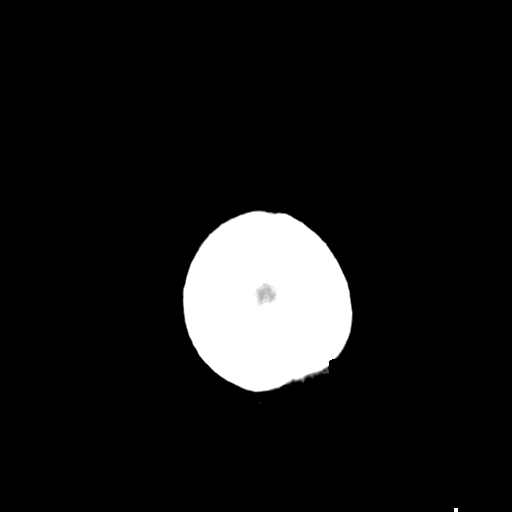

[Series 5: head without cor · coronal · non-contrast · 0.31mm/px · 3 of 70 slices shown]
[im 24/70  brain]
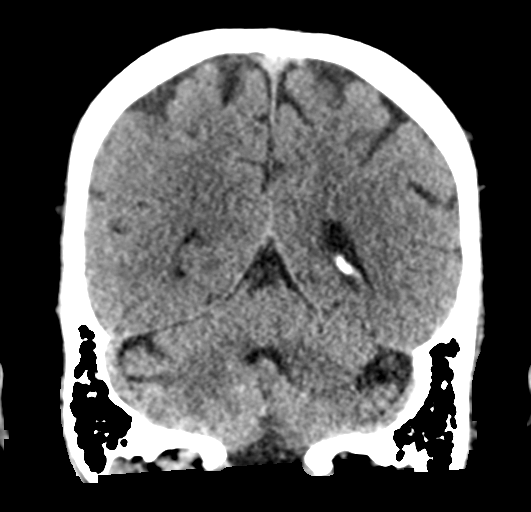
[im 31/70  brain]
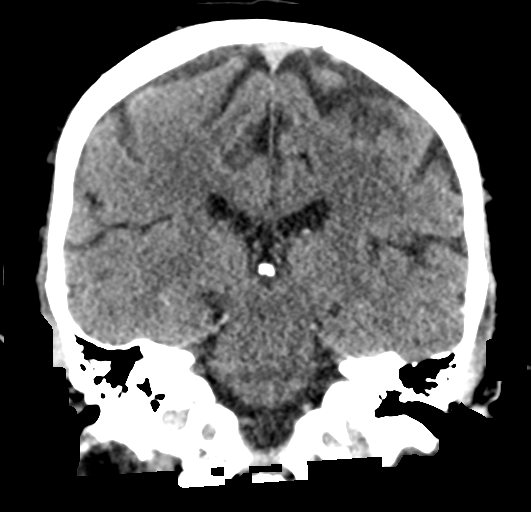
[im 39/70  brain]
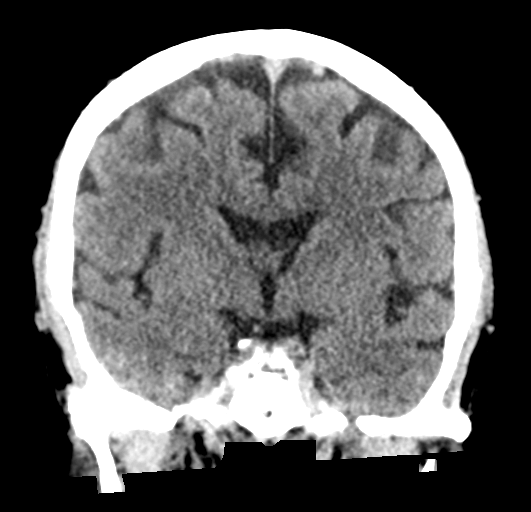

[Series 6: head without sag · sagittal · non-contrast · 0.31mm/px · 3 of 56 slices shown]
[im 19/56  brain]
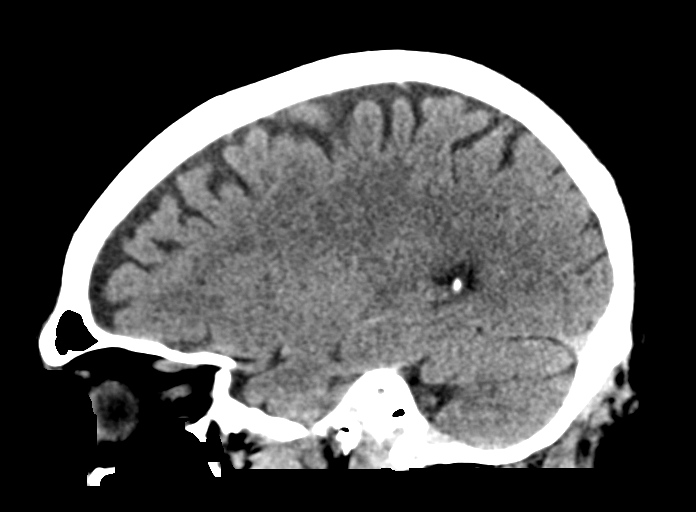
[im 28/56  brain]
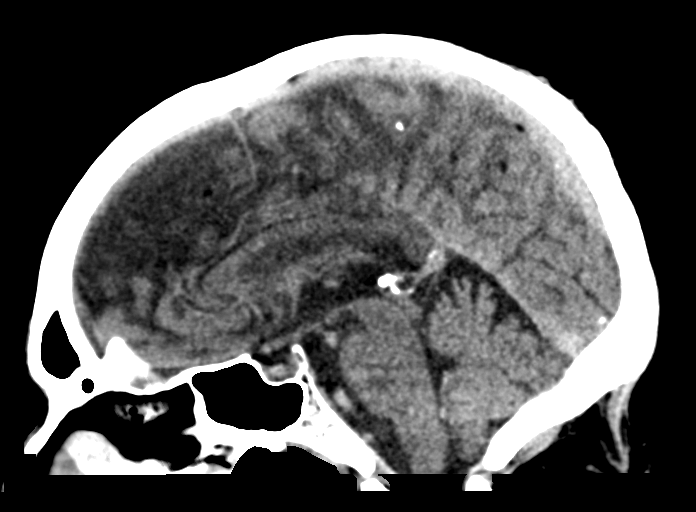
[im 37/56  brain]
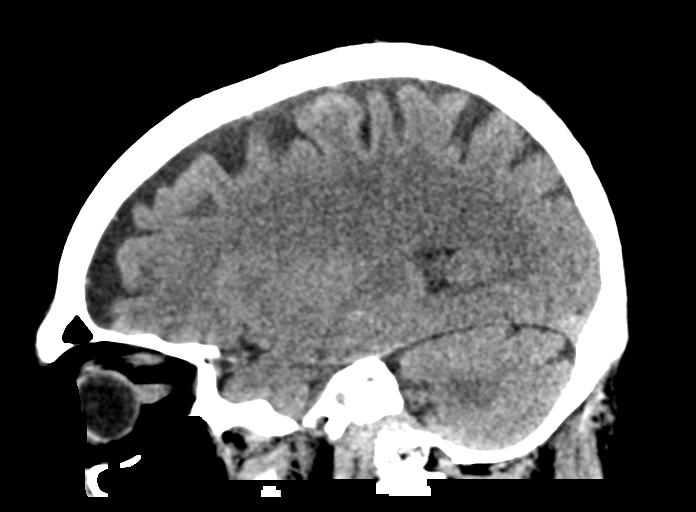

[16 of 47 positions shown; findings below may reference images not displayed]

FINDINGS: Brain: Stable age related cerebral atrophy and periventricular white
matter disease. No extra-axial fluid collections are identified. No
CT findings for acute hemispheric infarction or intracranial
hemorrhage. No mass lesions. The brainstem and cerebellum are
normal.

Vascular: Stable vascular calcifications. No aneurysm or hyperdense
vessels.

Skull: No skull fracture or bone lesions.

Sinuses/Orbits: The paranasal sinuses and mastoid air cells are
clear. The globes are intact.

Other: No scalp lesions or scalp hematoma.
IMPRESSION: 1. Stable age related cerebral atrophy and periventricular white
matter disease.
2. No acute intracranial findings or skull fracture.

## 2021-08-24 NOTE — ED Triage Notes (Addendum)
BIB EMS from home.  Pt reports hx of "mini strokes"  Today reports "slowing down" feeling. Neuro intact.  Worked out today.  Walked 1.5 miles.  Less po intake today. No breakfast, only 1 water.  No slurred speech, change in gait, or change in mentation per family  ?

## 2021-08-24 NOTE — ED Provider Triage Note (Signed)
Emergency Medicine Provider Triage Evaluation Note ? ?KIRO STEMLER , a 76 y.o. male  was evaluated in triage.  Pt complains of bumping his head at [employer] 45 minutes prior to arrival.  Patient states since this time he has felt "slow down".  Patient reports having history of TIA, denies being on blood thinners currently. ? ?Review of Systems  ?Positive:  ?Negative:  ? ?Physical Exam  ?BP (!) 150/93 (BP Location: Right Arm)   Pulse 73   Temp 98.1 ?F (36.7 ?C) (Oral)   Resp 16   SpO2 100%  ?Gen:   Awake, no distress   ?Resp:  Normal effort  ?MSK:   Moves extremities without difficulty  ?Other:  Patient motor and strength intact.  Patient sensation intact. ? ?Medical Decision Making  ?Medically screening exam initiated at 4:52 PM.  Appropriate orders placed.  Jimar Thone Landi was informed that the remainder of the evaluation will be completed by another provider, this initial triage assessment does not replace that evaluation, and the importance of remaining in the ED until their evaluation is complete. ? ? ?  ?Azucena Cecil, PA-C ?08/24/21 1653 ? ?

## 2021-08-25 ENCOUNTER — Encounter (HOSPITAL_COMMUNITY): Payer: Self-pay

## 2021-08-25 NOTE — ED Notes (Signed)
Reports he was at [employer] and ran into some glass hitting left eyebrow.  Complains he became weak after that happened so he was brought to the ER.  Denies urinary symptoms, sob, chest pain.  Just states "I dont feel like myself'.  NAD ?

## 2021-08-25 NOTE — ED Provider Notes (Signed)
?[facility] EMERGENCY DEPARTMENT ?Provider Note ? ? ?CSN: [id number] ?Arrival date & time: 08/24/21  1643 ? ?  ? ?History ? ?Chief Complaint  ?Patient presents with  ? Weakness  ? ? ?Colin Barron is a 76 y.o. male. ? ?HPI ?76 year old male presents after a head injury.  He states that he was walking towards the door and did not realize there was glass in between him and the door and he walked straight into it.  He did not lose consciousness but hit his left eyebrow.  He had a headache and soreness to that area ever since.  This occurred around 3:30 PM.  He is not on blood thinners though does take a baby aspirin.  No dizziness but he feels generally weak.  However no focal weakness or trouble walking.  He is feeling a little bit better while in the waiting room and has been in the emergency department for many hours. ? ?Home Medications ?Prior to Admission medications   ?Medication Sig Start Date End Date Taking? Authorizing Provider  ?aspirin EC 81 MG EC tablet Take 1 tablet (81 mg total) by mouth daily. Swallow whole. 02/09/21   Zannie Cove, MD  ?Cholecalciferol (VITAMIN D3 PO) Take by mouth daily.    [provider]  ?methotrexate (RHEUMATREX) 2.5 MG tablet Take 3 tablets (7.5 mg total) by mouth once a week. Caution:Chemotherapy. Protect from light. 07/24/21 11/13/21  Salena Saner, MD  ?Polyethyl Glycol-Propyl Glycol (SYSTANE) 0.4-0.3 % SOLN Apply to eye daily.    [provider]  ?predniSONE (DELTASONE) 10 MG tablet Take 1 tablet (10 mg total) by mouth daily with breakfast. 07/24/21   Salena Saner, MD  ?TURMERIC PO Take by mouth.    [provider]  ?VITAMIN D PO Take by mouth. D3    [provider]  ?   ? ?Allergies    ?Amoxicillin   ? ?Review of Systems   ?Review of Systems  ?Neurological:  Positive for weakness and headaches. Negative for dizziness and numbness.  ? ?Physical Exam ?Updated Vital Signs ?BP 138/83 (BP Location: Right Arm)    Pulse 63   Temp 97.7 ?F (36.5 ?C) (Oral)   Resp 16   Ht 5\' 11"  (1.803 m)   Wt 81.2 kg   SpO2 100%   BMI 24.97 kg/m?  ?Physical Exam ?Vitals and nursing note reviewed.  ?Constitutional:   ?   Appearance: He is well-developed.  ?HENT:  ?   Head: Normocephalic and atraumatic.  ?   Comments: Perhaps some mild soreness over his left eyebrow but no swelling or ecchymosis ?Eyes:  ?   Extraocular Movements: Extraocular movements intact.  ?   Pupils: Pupils are equal, round, and reactive to light.  ?Cardiovascular:  ?   Rate and Rhythm: Normal rate and regular rhythm.  ?   Heart sounds: Normal heart sounds.  ?Pulmonary:  ?   Effort: Pulmonary effort is normal.  ?   Breath sounds: Normal breath sounds.  ?Abdominal:  ?   Palpations: Abdomen is soft.  ?   Tenderness: There is no abdominal tenderness.  ?Skin: ?   General: Skin is warm and dry.  ?Neurological:  ?   Mental Status: He is alert.  ?   Comments: CN 3-12 grossly intact. 5/5 strength in all 4 extremities. Grossly normal sensation. Normal finger to nose.   ? ? ?ED Results / Procedures / Treatments   ?Labs ?(all labs ordered are listed, but only abnormal  results are displayed) ?Labs Reviewed  ?BASIC METABOLIC PANEL - Abnormal; Notable for the following components:  ?    Result Value  ? BUN 7 (*)   ? All other components within normal limits  ?CBC  ?URINALYSIS, ROUTINE W REFLEX MICROSCOPIC  ? ? ?EKG ?EKG Interpretation ? ?Date/Time:  Monday Aug 24 2021 16:33:36 EDT ?Ventricular Rate:  74 ?PR Interval:  148 ?QRS Duration: 74 ?QT Interval:  366 ?QTC Calculation: 406 ?R Axis:   -16 ?Text Interpretation: Normal sinus rhythm Minimal voltage criteria for LVH, may be normal variant ( R in aVL ) no acute ST/T changes Confirmed by Pricilla Loveless [phone]) on 08/25/2021 8:02:13 AM ? ?Radiology ?CT Head Wo Contrast ? ?Result Date: 08/24/2021 ?CLINICAL DATA:  Head trauma. EXAM: CT HEAD WITHOUT CONTRAST TECHNIQUE: Contiguous axial images were obtained from the base of the skull  through the vertex without intravenous contrast. RADIATION DOSE REDUCTION: This exam was performed according to the departmental dose-optimization program which includes automated exposure control, adjustment of the mA and/or kV according to patient size and/or use of iterative reconstruction technique. COMPARISON:  02/06/2021 FINDINGS: Brain: Stable age related cerebral atrophy and periventricular white matter disease. No extra-axial fluid collections are identified. No CT findings for acute hemispheric infarction or intracranial hemorrhage. No mass lesions. The brainstem and cerebellum are normal. Vascular: Stable vascular calcifications. No aneurysm or hyperdense vessels. Skull: No skull fracture or bone lesions. Sinuses/Orbits: The paranasal sinuses and mastoid air cells are clear. The globes are intact. Other: No scalp lesions or scalp hematoma. IMPRESSION: 1. Stable age related cerebral atrophy and periventricular white matter disease. 2. No acute intracranial findings or skull fracture. Electronically Signed   By: Rudie Meyer M.D.   On: 08/24/2021 18:41   ? ?Procedures ?Procedures  ? ? ?Medications Ordered in ED ?Medications - No data to display ? ?ED Course/ Medical Decision Making/ A&P ?  ?                        ?Medical Decision Making ?Amount and/or Complexity of Data Reviewed ?External Data Reviewed: notes. ?Labs:  ?   Details: Normal labs ?Radiology: independent interpretation performed. ?ECG/medicine tests: independent interpretation performed. ? ? ?Patient is well-appearing here.  CT head images that were obtained in triage have been reviewed by myself and there is no head bleed or skull fracture.  Labs that were obtained in triage and ECG have also been reviewed/interpreted and there is no acute ischemia on ECG and no significant lab disturbance.  My suspicion is his "weakness" is a symptom of concussion. Low suspicion that he will have a delayed bleed and he is actually been in this emergency  department for 15 hours and has slowly improved.  I do not think he needs further observation and can be discharged.  He will be given return precautions but appears stable for discharge. ? ? ? ? ? ? ? ?Final Clinical Impression(s) / ED Diagnoses ?Final diagnoses:  ?Concussion without loss of consciousness, initial encounter  ? ? ?Rx / DC Orders ?ED Discharge Orders   ? ? None  ? ?  ? ? ?  ?Pricilla Loveless, MD ?08/25/21 0820 ? ?

## 2021-08-25 NOTE — Discharge Instructions (Signed)
If you develop new or worsening headache, vision changes, inability to walk, weakness or numbness in extremities, or any other new/concerning symptoms then return to the ER for evaluation. ?

## 2021-08-26 ENCOUNTER — Other Ambulatory Visit
Admission: RE | Admit: 2021-08-26 | Discharge: 2021-08-26 | Disposition: A | Discharge status: Home / Self Care | Payer: Medicare PPO | Source: Ambulatory Visit | Attending: Primary Care | Admitting: Primary Care

## 2021-08-26 ENCOUNTER — Ambulatory Visit: Payer: Medicare PPO | Admitting: Primary Care

## 2021-08-26 ENCOUNTER — Encounter: Payer: Self-pay | Admitting: Primary Care

## 2021-08-26 VITALS — BP 118/72 | HR 72 | Temp 97.9°F | Ht 71.0 in | Wt 181.0 lb

## 2021-08-26 DIAGNOSIS — D869 Sarcoidosis, unspecified: Secondary | ICD-10-CM | POA: Diagnosis present

## 2021-08-26 LAB — HEPATIC FUNCTION PANEL
ALT: 15 U/L (ref 0–44)
AST: 18 U/L (ref 15–41)
Albumin: 4 g/dL (ref 3.5–5.0)
Alkaline Phosphatase: 55 U/L (ref 38–126)
Bilirubin, Direct: 0.2 mg/dL (ref 0.0–0.2)
Indirect Bilirubin: 0.8 mg/dL (ref 0.3–0.9)
Total Bilirubin: 1 mg/dL (ref 0.3–1.2)
Total Protein: 7.1 g/dL (ref 6.5–8.1)

## 2021-08-26 NOTE — Assessment & Plan Note (Signed)
-   No respiratory complaints. Tolerating methotrexate 7.5mg  weekly and 10mg  prednisone daily. No longer on bactrim. No side effects from medications. CMET was normal in March 2023, needs repeat labs today to monitor liver function. Plan repeat CT chest imaging in 3 months. FU in 4 months with Dr. Patsey Berthold.   ?

## 2021-08-26 NOTE — Patient Instructions (Addendum)
Recommendations: ?Continue Methotrexate 7.5mg  weekly ?Continue prednisone 10mg  daily  ? ?Orders: ?Hepatic function test today  ? ?Orders: ?CT chest wo contrast in 3 months [id number]  ? ?Follow-up: ?4 months with Dr. Patsey Berthold (after CT chest imaging) ? ? ?Sarcoidosis ? ?Sarcoidosis is a disease that can cause inflammation in many areas of the body. It most often affects the lungs (pulmonary sarcoidosis). Sarcoidosis can also affect the lymph nodes, liver, eyes, skin, heart, or any other body tissue. ?Normally, cells that are part of the body's disease-fighting system (immune system) attack harmful substances in the body, such as germs. This immune system response causes inflammation. After the harmful substance is destroyed, the inflammation goes away. When you have sarcoidosis, your immune system causes inflammation even when there are no harmful substances, and the inflammation does not go away. Sarcoidosis also causes cells from your immune system to form small lumps (granulomas) in the affected area of your body. ?What are the causes? ?The exact cause of sarcoidosis is not known.  ?If you have a family history of this disease (genetic predisposition), the immune system response that leads to inflammation may be triggered by something in your environment, such as: ?Bacteria or viruses. ?Metals. ?Chemicals. ?Dust. ?Mold or mildew. ?What increases the risk? ?You may be more likely to develop this condition if you: ?Have a family history of the disease. ?Are African American. ?Are of Northern European descent. ?Are 88-79 years old. ?Are male. ?Work as a Airline pilot. ?Work in an environment where you are exposed to metals, chemicals, mold or mildew, or insecticides. ?What are the signs or symptoms? ?Some people with sarcoidosis have no symptoms. Others have very mild symptoms. The symptoms usually depend on the organ that is affected. Sarcoidosis most often affects the lungs, which may lead to symptoms such  as: ?Chest pain. ?Coughing. ?Wheezing. ?Shortness of breath. ?Other common symptoms include: ?Night sweats. ?Fever. ?Weight loss. ?Tiredness (fatigue). ?Swollen lymph nodes. ?Joint pain. ?How is this diagnosed? ?This condition may be diagnosed based on: ?Your symptoms and medical history. ?A physical exam. ?Imaging tests such as: ?Chest X-ray. ?CT scan. ?MRI. ?PET scan. ?Lung function tests. These tests evaluate your breathing and check for problems that may be related to sarcoidosis. ?A procedure to remove a tissue sample for testing (biopsy). You may have a biopsy of lung tissue if that is where you are having symptoms. ?You may have tests to check for any complications of the condition. These tests may include: ?Eye exams. ?MRI of the heart or brain. ?Echocardiogram. ?ECG (electrocardiogram). ?How is this treated? ?In some cases, sarcoidosis does not require a specific treatment because it causes no symptoms or only mild symptoms. If your symptoms bother you or are severe, you may be prescribed medicines to reduce inflammation or relieve symptoms. These medicines may include: ?Prednisone. This is a steroid that reduces inflammation related to sarcoidosis. ?Hydroxychloroquine. This may be used to treat sarcoidosis that affects the skin, eyes, or brain. ?Certain medicines that affect the immune system. These can help with sarcoidosis in the joints, eyes, skin, or lungs. ?Medicines that you breathe in (inhalers). Inhalers can help you breathe if sarcoidosis affects your lungs. ?Follow these instructions at home: ? ?Do not use any products that contain nicotine or tobacco. These products include cigarettes, chewing tobacco, and vaping devices, such as e-cigarettes. If you need help quitting, ask your health care provider. ?Avoid secondhand smoke and irritating dust or chemicals. Stay indoors on days when air quality is poor in your area. ?Return  to your normal activities as told by your health care provider. Ask your  health care provider what activities are safe for you. ?Take or use over-the-counter and prescription medicines only as told by your health care provider. ?Keep all follow-up visits. This is important. ?Where to find more information ?National Heart, Lung, and Blood Institute: https://wilson-eaton.com/ ?Contact a health care provider if: ?You have vision problems. ?You have a dry cough that does not go away. ?You have an irregular heartbeat. ?You have pain or aches in your joints, hands, or feet. ?You have an unexplained rash. ?Get help right away if: ?You have chest pain. ?You have trouble breathing. ?These symptoms may represent a serious problem that is an emergency. Do not wait to see if the symptoms will go away. Get medical help right away. Call your local emergency services (911 in the [place] Do not drive yourself to the hospital. ?Summary ?Sarcoidosis is a disease that can cause inflammation in many body areas of the body. It most often affects the lungs (pulmonary sarcoidosis). It can also affect the lymph nodes, liver, eyes, skin, heart, or any other body tissue. ?When you have sarcoidosis, cells from your immune system form small lumps (granulomas) in the affected area of your body. ?Sarcoidosis sometimes does not require a specific treatment because it causes no symptoms or only mild symptoms. ?If your symptoms bother you or are severe, you may be prescribed medicines to reduce inflammation or relieve symptoms. ?This information is not intended to replace advice given to you by your health care provider. Make sure you discuss any questions you have with your health care provider. ?Document Revised: 02/12/2020 Document Reviewed: 02/12/2020 ?Elsevier Patient Education ? [place]. ? ?

## 2021-08-26 NOTE — Progress Notes (Signed)
? ?@Patient  ID: , male    DOB: 25-May-1945, 76 y.o.   MRN: [id number] ? ?Chief Complaint  ?Patient presents with  ? Follow-up  ?  No current sx.   ? ? ?Referring provider: ?[id number], FNP ? ?HPI: ?76 year old male, never smoked.  Past medical history significant for sarcoidosis.  Patient of Dr. 73, seen in office on 07/24/2021. ? ?Previous [facility] pulmonary encounter: ?07/24/21- Dr. 07/26/21  ?Patient is a 76 year old lifelong never smoker who presents for follow-up on the issue of mediastinal adenopathy.  He was last seen here on 09 June 2021 please refer to that note for the details. Patient is totally asymptomatic with regards to his mediastinal adenopathy.  This was an incidental finding during an admission at [facility] in mid October 2022 due to a TIA.  The patient had undergone CT chest with contrast on 16 March 2021 which showed a cluster of lymph nodes in the left hilum measuring 2.5 x 3.1 cm, bilateral hilar and mediastinal adenopathy and areas of partial calcification of the lymph nodes.  The patient underwent PET/CT on 18 May 2021 which showed enlarged and hypermetabolic mediastinal and hilar lymph nodes most of which were calcified favoring a benign process such as sarcoidosis or granulomatous disease.  There were also mildly hypermetabolic bilateral pulmonary nodules with upper lobe predominance consistent with sarcoidosis.  However there was a hypermetabolic uptake in the left iliac bone with no definite CT correlate.  An MRI followed this which showed a lesion in the left ilium measuring 2.5 x 1 x 2.9 cm.  There were also similar lesions in the left lesser trochanter and right inferior pubic ramus.  These lesions were concerning for metastatic disease versus myeloma.  The patient underwent biopsy of the left ilium on 13 February showing nonnecrotizing granulomas consistent with sarcoid granulomas, AFB and fungal stains negative.  Patient since have been placed  on prednisone at 20 mg daily with Bactrim DS 3 times a week.  He completed his prednisone 20 mg today.  He presents for follow-up after results were known of the bone biopsy.  He is still having some left hip pain but this is improving somewhat.  He does not endorse any shortness of breath, fevers, chills or sweats. ? ?Because of bone involvement by his sarcoidosis we will need to add a steroid sparing agent.  The patient presented today with his brother.  He discussed additional therapy for his sarcoidosis.  We discussed 2 potential occasions that being methotrexate and hydroxychloroquine (Plaquenil).  After discussion of the pros and cons of both of them the patient has opted for methotrexate.  Is aware that he will need close monitoring of his hepatic function while on this medication.  ? ?The patient does not endorse any other complaint today.  He seems somewhat aggravated by the fact that he needs to take medication. ? ?Review of Systems ?A 10 point review of systems was performed and it is as noted above otherwise negative. ? ? ?08/26/2021 ?Patient presents today for 4 to 6-week follow-up. Hx sarcoidosis with bone involvement. Accompanied by his brother. He has no acute respiratory complaints. He is taking methotrexate 7.5 mg once weekly and prednisone 10 mg daily as prescribed.  He is tolerating medication well. No nausea or vomiting. Liver function testing in March was normal. He was evaluated at ED on 08/24/21 after sustaining a concussion without LOC at the gym. He tells me that he walked into a glass door by  accident. CT head without acute intracranial findings or skull fracture. Denies dizziness, N/V, shortness of breath, cough or chest pain/tightness.  ? ? ?Allergies  ?Allergen Reactions  ? Amoxicillin Other (See Comments)  ?  Tolerated Rocephin IV on 04/25/2021 ? ?  ? ? ?Immunization History  ?Administered Date(s) Administered  ? Influenza, High Dose Seasonal PF 01/28/2017  ? Moderna Sars-Covid-2  Vaccination 06/04/2019, 07/04/2019  ? ? ?Past Medical History:  ?Diagnosis Date  ? Arthritis   ? BPH (benign prostatic hyperplasia) 12/08/2016  ? ? ?Tobacco History: ?Social History  ? ?Tobacco Use  ?Smoking Status Never  ?Smokeless Tobacco Never  ? ?Counseling given: Not Answered ? ? ?Outpatient Medications Prior to Visit  ?Medication Sig Dispense Refill  ? aspirin EC 81 MG EC tablet Take 1 tablet (81 mg total) by mouth daily. Swallow whole. 30 tablet 0  ? Cholecalciferol (VITAMIN D3 PO) Take by mouth daily.    ? methotrexate (RHEUMATREX) 2.5 MG tablet Take 3 tablets (7.5 mg total) by mouth once a week. Caution:Chemotherapy. Protect from light. 12 tablet 3  ? Polyethyl Glycol-Propyl Glycol (SYSTANE) 0.4-0.3 % SOLN Apply to eye daily.    ? predniSONE (DELTASONE) 10 MG tablet Take 1 tablet (10 mg total) by mouth daily with breakfast. 30 tablet 3  ? TURMERIC PO Take by mouth.    ? VITAMIN D PO Take by mouth. D3    ? ?No facility-administered medications prior to visit.  ? ?Review of Systems ? ?Review of Systems  ?Constitutional: Negative.   ?HENT: Negative.    ?Respiratory: Negative.  Negative for cough, chest tightness, shortness of breath and wheezing.   ?Cardiovascular: Negative.   ? ? ?Physical Exam ? ?BP 118/72 (BP Location: Left Arm, Cuff Size: Normal)   Pulse 72   Temp 97.9 ?F (36.6 ?C) (Temporal)   Ht 5\' 11"  (1.803 m)   Wt 181 lb (82.1 kg)   SpO2 98%   BMI 25.24 kg/m?  ?Physical Exam ?Constitutional:   ?   Appearance: Normal appearance.  ?HENT:  ?   Head: Normocephalic and atraumatic.  ?Cardiovascular:  ?   Rate and Rhythm: Normal rate and regular rhythm.  ?   Heart sounds: No murmur heard. ?Pulmonary:  ?   Effort: Pulmonary effort is normal.  ?   Breath sounds: Normal breath sounds. No wheezing, rhonchi or rales.  ?   Comments: CTA ?Musculoskeletal:     ?   General: Normal range of motion.  ?Skin: ?   General: Skin is warm and dry.  ?   Comments: No skin lesions  ?Neurological:  ?   General: No focal  deficit present.  ?   Mental Status: He is alert and oriented to person, place, and time. Mental status is at baseline.  ?Psychiatric:     ?   Mood and Affect: Mood normal.     ?   Behavior: Behavior normal.     ?   Thought Content: Thought content normal.     ?   Judgment: Judgment normal.  ?  ? ?Lab Results: ? ?CBC ?   ?Component Value Date/Time  ? WBC 9.5 08/24/2021 1716  ? RBC 4.79 08/24/2021 1716  ? HGB 15.5 08/24/2021 1716  ? HCT 44.7 08/24/2021 1716  ? PLT 173 08/24/2021 1716  ? MCV 93.3 08/24/2021 1716  ? MCH 32.4 08/24/2021 1716  ? MCHC 34.7 08/24/2021 1716  ? RDW 12.2 08/24/2021 1716  ? LYMPHSABS 1.0 07/24/2021 1210  ? MONOABS 0.6 07/24/2021  1210  ? EOSABS 0.1 07/24/2021 1210  ? BASOSABS 0.0 07/24/2021 1210  ? ? ?BMET ?   ?Component Value Date/Time  ? NA 138 08/24/2021 1716  ? K 3.8 08/24/2021 1716  ? CL 102 08/24/2021 1716  ? CO2 28 08/24/2021 1716  ? GLUCOSE 98 08/24/2021 1716  ? BUN 7 (L) 08/24/2021 1716  ? CREATININE 0.83 08/24/2021 1716  ? CREATININE 0.83 12/21/2019 1427  ? CALCIUM 9.5 08/24/2021 1716  ? GFRNONAA >60 08/24/2021 1716  ? GFRNONAA 87 12/21/2019 1427  ? GFRAA 100 12/21/2019 1427  ? ? ?BNP ?No results found for: BNP ? ?ProBNP ?No results found for: PROBNP ? ?Imaging: ?CT Head Wo Contrast ? ?Result Date: 08/24/2021 ?CLINICAL DATA:  Head trauma. EXAM: CT HEAD WITHOUT CONTRAST TECHNIQUE: Contiguous axial images were obtained from the base of the skull through the vertex without intravenous contrast. RADIATION DOSE REDUCTION: This exam was performed according to the departmental dose-optimization program which includes automated exposure control, adjustment of the mA and/or kV according to patient size and/or use of iterative reconstruction technique. COMPARISON:  02/06/2021 FINDINGS: Brain: Stable age related cerebral atrophy and periventricular white matter disease. No extra-axial fluid collections are identified. No CT findings for acute hemispheric infarction or intracranial hemorrhage. No  mass lesions. The brainstem and cerebellum are normal. Vascular: Stable vascular calcifications. No aneurysm or hyperdense vessels. Skull: No skull fracture or bone lesions. Sinuses/Orbits: The paranasal sinuses and m

## 2021-08-27 NOTE — Progress Notes (Signed)
Agree with the details of the visit as noted by Elizabeth Walsh, NP.  C. Laura Jerimiah Wolman, MD [facility] 

## 2021-08-27 NOTE — Progress Notes (Signed)
Liver function was normal

## 2021-10-19 ENCOUNTER — Telehealth: Payer: Self-pay | Admitting: Pulmonary Disease

## 2021-10-20 NOTE — Telephone Encounter (Signed)
Lm for patient.  According to last OV note, patient was instructed to continue Methotrexate 7.5 and prednisone 10mg .

## 2021-10-21 NOTE — Telephone Encounter (Signed)
Patient is aware of below message and voiced his understanding.  Nothing further needed.   

## 2021-11-15 ENCOUNTER — Other Ambulatory Visit: Payer: Self-pay | Admitting: Pulmonary Disease

## 2021-11-16 ENCOUNTER — Other Ambulatory Visit: Payer: Self-pay | Admitting: Pulmonary Disease

## 2021-11-26 ENCOUNTER — Ambulatory Visit
Admission: RE | Admit: 2021-11-26 | Discharge: 2021-11-26 | Disposition: A | Discharge status: Home / Self Care | Payer: Medicare PPO | Source: Ambulatory Visit | Attending: Primary Care | Admitting: Primary Care

## 2021-11-26 DIAGNOSIS — D869 Sarcoidosis, unspecified: Secondary | ICD-10-CM | POA: Diagnosis present

## 2021-12-24 ENCOUNTER — Encounter: Payer: Self-pay | Admitting: Pulmonary Disease

## 2021-12-24 ENCOUNTER — Ambulatory Visit: Payer: Medicare PPO | Admitting: Pulmonary Disease

## 2021-12-24 VITALS — BP 112/70 | HR 85 | Temp 98.3°F | Ht 71.0 in | Wt 178.0 lb

## 2021-12-24 DIAGNOSIS — D869 Sarcoidosis, unspecified: Secondary | ICD-10-CM

## 2021-12-24 DIAGNOSIS — M899 Disorder of bone, unspecified: Secondary | ICD-10-CM

## 2021-12-24 MED ORDER — PREDNISONE 5 MG PO TABS: 5.0000 mg | ORAL_TABLET | Freq: Every day | ORAL | 2 refills | Status: DC

## 2021-12-24 NOTE — Progress Notes (Signed)
Subjective:    Patient ID: Colin Barron, male    DOB: 25-Mar-1946, 76 y.o.   MRN: [id number] Patient Care Team: Berniece Salines, FNP as PCP - General (Nurse Practitioner)  Chief Complaint  Patient presents with   Follow-up    No current sx.    HPI Patient is a 76 year old lifelong never smoker who presents for follow-up on the issue of sarcoidosis.  Initially evaluated because of mediastinal adenopathy.  He was last seen here on 26 Aug 2021 by Ames Dura, NP.  He has been noted to have sarcoidosis with bone involvement he has been on methotrexate and prednisone.  He is on methotrexate 7.5 mg weekly and 10 mg of prednisone daily.  His last liver function studies were done during his May visit and these were normal.  Bone involvement was noted on the left ilium noted by PET/CT and MRI.  He had biopsy of this area with findings of the nonnecrotizing granulomatous consistent with sarcoid granulomas with AFB and fungal stains negative.  Initially he had left hip pain but this has now resolved.  He has not had any shortness of breath, fevers, chills or sweats.  He has not had any cough or sputum production.  He is anxious to get off of medications.  Review of Systems A 10 point review of systems was performed and it is as noted above otherwise negative.  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 Swallow whole.   Cholecalciferol (VITAMIN D3 PO) Take by mouth daily.   methotrexate (RHEUMATREX) 2.5 MG tablet SMARTSIG:3 Tablet(s) By Mouth Once a Week   Polyethyl Glycol-Propyl Glycol (SYSTANE) 0.4-0.3 % SOLN Apply to eye daily.   predniSONE (DELTASONE) 10 MG tablet TAKE 1 TABLET (10 MG TOTAL) BY MOUTH DAILY WITH BREAKFAST.   TURMERIC PO Take by mouth.   VITAMIN D PO Take by mouth. D3   Immunization History  Administered Date(s) Administered   Influenza, High Dose Seasonal PF 01/28/2017   Moderna Sars-Covid-2 Vaccination 06/04/2019, 07/04/2019      Objective:   Physical Exam BP 112/70 (BP Location: Left Arm, Cuff Size: Normal)   Pulse 85   Temp 98.3 F (36.8 C) (Temporal)   Ht 5\' 11"  (1.803 m)   Wt 178 lb (80.7 kg)   SpO2 97%   BMI 24.83 kg/m  GENERAL: Frail appearing gentleman, no acute distress.  Well-nourished well-developed, ambulates with assistance of a cane.  No respiratory distress, no conversational dyspnea. HEAD: Normocephalic, atraumatic.  EYES: Pupils equal, round, reactive to light.  No scleral icterus.  MOUTH: Nose/mouth/throat not examined due to masking requirements for COVID 19. NECK: Supple. No thyromegaly. Trachea midline. No JVD.  No adenopathy. PULMONARY: Good air entry bilaterally.  No adventitious sounds.  CARDIOVASCULAR: S1 and S2. Regular rate and rhythm.  No rubs, murmurs or gallops heard. ABDOMEN: Benign. MUSCULOSKELETAL: No joint deformity, no clubbing, no edema.  NEUROLOGIC: No focal deficit, no gait disturbance, speech is fluent. SKIN: Intact,warm,dry. PSYCH: Mood and behavior normal.     Assessment & Plan:     ICD-10-CM   1. Sarcoidosis  D86.9    We will decrease prednisone to 5 mg daily Continue methotrexate at 7.5 mg/week Reassess activity with PET/CT    2. Bone lesion  M89.9 NM PET Image Restage (PS) Skull Base to Thigh (F-18  FDG)   LEFT ilium Biopsy-proven sarcoid involvement PET/CT to monitor activity     Orders Placed This Encounter  Procedures   NM PET Image Restage (PS) Skull Base to Thigh (F-18 FDG)    Standing Status:   Future    Standing Expiration Date:   12/25/2022    Scheduling Instructions:     Next available.    Order Specific Question:   If indicated for the ordered procedure, I authorize the administration of a radiopharmaceutical per Radiology protocol    Answer:   Yes    Order Specific Question:   Preferred imaging location?    Answer:   [facility]   We will see the patient in follow-up in 6 to 8 weeks time he is to contact us prior to that time should any new difficulties arise.  Gailen Shelter, MD [facility]    *This note was dictated using voice recognition software/Dragon.  Despite best efforts to proofread, errors can occur which can change the meaning. Any transcriptional errors that result from this process are unintentional and may not be fully corrected at the time of dictation.

## 2021-12-24 NOTE — Patient Instructions (Addendum)
We are going to reduce your prednisone to 5 mg daily.  An order for the 5 mg tablets was sent to your pharmacy.  Continue taking the methotrexate as you are doing.  We are going to get another PET/CT to evaluate the activity on the bone that was biopsied before.  We will see you in follow-up in 6 to 8 weeks time call sooner should any new problems arise.

## 2021-12-25 ENCOUNTER — Encounter: Payer: Self-pay | Admitting: Pulmonary Disease

## 2022-01-05 ENCOUNTER — Encounter
Admission: RE | Admit: 2022-01-05 | Discharge: 2022-01-05 | Disposition: A | Discharge status: Home / Self Care | Payer: Medicare PPO | Source: Ambulatory Visit | Attending: Pulmonary Disease | Admitting: Pulmonary Disease

## 2022-01-05 DIAGNOSIS — M899 Disorder of bone, unspecified: Secondary | ICD-10-CM | POA: Insufficient documentation

## 2022-01-05 DIAGNOSIS — I2510 Atherosclerotic heart disease of native coronary artery without angina pectoris: Secondary | ICD-10-CM | POA: Diagnosis not present

## 2022-01-05 DIAGNOSIS — I700 Atherosclerosis of aorta: Secondary | ICD-10-CM | POA: Insufficient documentation

## 2022-01-05 DIAGNOSIS — D869 Sarcoidosis, unspecified: Secondary | ICD-10-CM | POA: Insufficient documentation

## 2022-01-05 LAB — GLUCOSE, CAPILLARY: Glucose-Capillary: 113 mg/dL — ABNORMAL HIGH (ref 70–99)

## 2022-01-05 MED ORDER — FLUDEOXYGLUCOSE F - 18 (FDG) INJECTION
9.2000 | Freq: Once | INTRAVENOUS | Status: AC | PRN
  Administered 2022-01-05: 9.64 via INTRAVENOUS

## 2022-01-08 ENCOUNTER — Other Ambulatory Visit: Payer: Self-pay

## 2022-01-08 DIAGNOSIS — Z5181 Encounter for therapeutic drug level monitoring: Secondary | ICD-10-CM

## 2022-01-15 NOTE — Progress Notes (Signed)
Please see note from 24 December 2021.  Patient has sarcoidosis with BONE INVOLVEMENT.  The only way to monitor response to therapy is by obtaining PET/CT.

## 2022-02-19 ENCOUNTER — Encounter: Payer: Self-pay | Admitting: Pulmonary Disease

## 2022-02-19 ENCOUNTER — Ambulatory Visit: Payer: Medicare PPO | Admitting: Pulmonary Disease

## 2022-02-19 VITALS — BP 122/82 | HR 68 | Temp 98.2°F | Ht 71.0 in | Wt 178.8 lb

## 2022-02-19 DIAGNOSIS — D869 Sarcoidosis, unspecified: Secondary | ICD-10-CM | POA: Diagnosis not present

## 2022-02-19 DIAGNOSIS — M899 Disorder of bone, unspecified: Secondary | ICD-10-CM | POA: Diagnosis not present

## 2022-02-19 MED ORDER — METHOTREXATE SODIUM 2.5 MG PO TABS: 10.0000 mg | ORAL_TABLET | ORAL | 2 refills | Status: DC

## 2022-02-19 MED ORDER — PREDNISONE 5 MG PO TABS: 5.0000 mg | ORAL_TABLET | Freq: Every day | ORAL | 2 refills | Status: DC

## 2022-02-19 NOTE — Progress Notes (Signed)
Subjective:    Patient ID: Colin Barron, male    DOB: 1945-12-19, 76 y.o.   MRN: [id number] Patient Care Team: Bo Merino, FNP as PCP - General (Nurse Practitioner)  Chief Complaint  Patient presents with   Follow-up    Sarcoidosis. No SOB, wheezing or cough.    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 Initially evaluated because of mediastinal adenopathy.  He was last seen here on 24 December 2021 he he has been noted to have sarcoidosis with bone involvement he has been on methotrexate and prednisone. He is on methotrexate 7.5 mg weekly and 5 mg of prednisone daily.  His last liver function studies were done during his May visit and these were normal.  Bone involvement was noted on the left ilium noted by PET/CT and MRI.  He had biopsy of this area with findings of the nonnecrotizing granulomas consistent with sarcoid granulomas with AFB and fungal stains negative.  Initially he had left hip pain but this has now resolved.  He has not had any shortness of breath, fevers, chills or sweats.  He has not had any cough or sputum production.  He is anxious to get off of medications.  Patient has poor understanding of disease process.  I again explained to him the need for taking methotrexate once a week and we will continue to try to wean him off prednisone.  His recent PET/CT was performed on 05 January 2022 this showed partially calcified hypermetabolic mediastinal hilar and internal mammary lymph nodes as well as periportal lymph nodes.  There is hypermetabolic lateral left clavicular lesion that is stable.  The left iliac wing lesion is slightly less hypermetabolic.  All of the findings are compatible with biopsy-proven sarcoid.  Patient continues to tolerate methotrexate and prednisone well.  But again he has poor understanding of his disease process even though this has been explained to him and  his brother every time he comes for a visit.  I reviewed all of the imaging with patient and his brother who is with him today I also reviewed the pathophysiology of sarcoid in layman's terms and also the importance of staying on the medication as prescribed.  Patient seemed to understand the need for medications as above.   Review of Systems A 10 point review of systems was performed and it is as noted above otherwise negative.  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 Swallow whole.   Cholecalciferol (VITAMIN D3 PO) Take 1 tablet by mouth every other day.   methotrexate (RHEUMATREX) 2.5 MG tablet SMARTSIG:3 Tablet(s) By Mouth Once a Week   Polyethyl Glycol-Propyl Glycol (SYSTANE) 0.4-0.3 % SOLN Apply to  eye daily.   predniSONE (DELTASONE) 5 MG tablet Take 1 tablet (5 mg total) by mouth daily with breakfast.   Immunization History  Administered Date(s) Administered   Influenza, High Dose Seasonal PF 01/28/2017   Moderna Sars-Covid-2 Vaccination 06/04/2019, 07/04/2019       Objective:   Physical Exam BP 122/82 (BP Location: Left Arm, Cuff Size: Normal)   Pulse 68   Temp 98.2 F (36.8 C)   Ht 5\' 11"  (1.803 m)   Wt 178 lb 12.8 oz (81.1 kg)   SpO2 100%   BMI 24.94 kg/m  GENERAL: Frail  appearing gentleman, no acute distress.  Well-nourished well-developed, ambulates with assistance of a cane.  No respiratory distress, no conversational dyspnea. HEAD: Normocephalic, atraumatic.  EYES: Pupils equal, round, reactive to light.  No scleral icterus.  MOUTH: Nose/mouth/throat not examined due to masking requirements for COVID 19. NECK: Supple. No thyromegaly. Trachea midline. No JVD.  No adenopathy. PULMONARY: Good air entry bilaterally.  No adventitious sounds. CARDIOVASCULAR: S1 and S2. Regular rate and rhythm.  No rubs, murmurs or gallops heard. ABDOMEN: Benign. MUSCULOSKELETAL: No joint deformity, no clubbing, no edema.  NEUROLOGIC: No focal deficit, no gait disturbance, speech is fluent. SKIN: Intact,warm,dry. PSYCH: Mood and behavior normal.       Assessment & Plan:     ICD-10-CM   1. Sarcoidosis  D86.9 CBC with Differential/Platelet    Comprehensive metabolic panel   Persistent CT activity on mediastinal lymph nodes Tolerating methotrexate Increase methotrexate to 10 mg/week (4 tablets) Check CBC/CMET on ROV    2. Bone lesion  M89.9    Biopsy-proven sarcoid involvement IMPROVED on most recent PET/CT     Orders Placed This Encounter  Procedures   CBC with Differential/Platelet    Standing Status:   Future    Standing Expiration Date:   02/20/2023   Comprehensive metabolic panel    Standing Status:   Future    Standing Expiration Date:   02/20/2023   Meds ordered this encounter  Medications   methotrexate (RHEUMATREX) 2.5 MG tablet    Sig: Take 4 tablets (10 mg total) by mouth once a week. Caution:Chemotherapy. Protect from light.    Dispense:  16 tablet    Refill:  2   Patient continues to have some activity noted on PET/CT.  We increased the methotrexate to 10 mg every week.  He will also continue prednisone at 5 mg daily however will consider tapering this completely off at his next visit if he is doing well.    We will see the patient in follow-up  in 4 to 6 weeks time he is to contact 02/22/2023 prior to that time should any new difficulties arise.  [place], MD Advanced Bronchoscopy PCCM [facility]    *This note was dictated using voice recognition software/Dragon.  Despite best efforts to proofread, errors can occur which can change the meaning. Any transcriptional errors that result from this process are unintentional and may not be fully corrected at the time of dictation.

## 2022-02-19 NOTE — Patient Instructions (Signed)
Have increased your sarcoidosis medicine to 4 tablets once a week.  [facility].  We will see you back in 4 to 6 weeks time you will have to stop by the lab prior to the visit there should be orders for you to get some blood work.

## 2022-03-02 ENCOUNTER — Ambulatory Visit (INDEPENDENT_AMBULATORY_CARE_PROVIDER_SITE_OTHER): Payer: Medicare PPO | Admitting: Family Medicine

## 2022-03-02 ENCOUNTER — Encounter: Payer: Self-pay | Admitting: Family Medicine

## 2022-03-02 VITALS — BP 122/80 | HR 81 | Temp 98.2°F | Resp 14 | Ht 71.0 in | Wt 179.0 lb

## 2022-03-02 DIAGNOSIS — D869 Sarcoidosis, unspecified: Secondary | ICD-10-CM

## 2022-03-02 DIAGNOSIS — Z0000 Encounter for general adult medical examination without abnormal findings: Secondary | ICD-10-CM | POA: Diagnosis not present

## 2022-03-02 NOTE — Progress Notes (Signed)
Annual Wellness Visit  Patient: Colin Barron, Male    DOB: 06/29/1945, 76 y.o.   MRN: [id number]  Subjective  Chief Complaint  Patient presents with   Medicare Wellness    Colin Barron is a 76 y.o. male who presents today for his Annual Wellness Visit. He reports consuming a general diet, not much red meat. Gym/ health club routine includes light weights and walking on track . He generally feels well. He reports sleeping well. He does not have additional problems to discuss today.   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 Swallow whole.   Cholecalciferol (VITAMIN D3 PO) Take 1 tablet by mouth every other day.   methotrexate (RHEUMATREX) 2.5 MG tablet Take 4 tablets (10 mg total) by mouth once a week. Caution:Chemotherapy. Protect from light.   Polyethyl Glycol-Propyl Glycol (SYSTANE) 0.4-0.3 % SOLN Apply to eye daily.   predniSONE (DELTASONE) 5 MG tablet Take 1 tablet (5 mg total) by mouth daily with breakfast.   TURMERIC PO Take by mouth. (Patient not taking: Reported on 03/02/2022)   VITAMIN D PO Take by mouth. D3 (Patient not taking: Reported on 03/02/2022)   No facility-administered  medications prior to visit.    Allergies  Allergen Reactions   Amoxicillin Other (See Comments)    Tolerated Rocephin IV on 04/25/2021      Patient Care Team: Berniece Salines, FNP as PCP - General (Nurse Practitioner) Salena Saner, MD as Consulting Physician (Pulmonary Disease)  ROS      Objective  BP 122/80 (BP Location: Right Arm, Patient Position: Sitting, Cuff Size: Normal)   Pulse 81   Temp 98.2 F (36.8 C) (Oral)   Resp 14   Ht 5\' 11"  (1.803 m)   Wt 179 lb (81.2 kg)   SpO2 98%   BMI 24.97 kg/m  BP Readings from Last 3 Encounters:  03/02/22 122/80  02/19/22 122/82  12/24/21 112/70   Wt Readings from Last 3 Encounters:  03/02/22 179 lb (81.2 kg)  02/19/22 178 lb 12.8 oz (81.1 kg)  12/24/21 178 lb (80.7 kg)      Physical Exam Constitutional:      General: He is not in acute distress. HENT:     Head: Normocephalic and atraumatic.     Right Ear: External ear normal.     Left Ear: External ear normal.     Nose: Nose normal.  Cardiovascular:     Rate and Rhythm: Normal rate.  Pulmonary:     Effort: Pulmonary effort is normal. No respiratory distress.  Musculoskeletal:        General: Normal range of motion.  Neurological:  Mental Status: He is alert and oriented to person, place, and time. Mental status is at baseline.  Psychiatric:        Mood and Affect: Mood normal.        Behavior: Behavior normal.     Most recent functional status assessment:    03/02/2022   11:04 AM  In your present state of health, do you have any difficulty performing the following activities:  Hearing? 0  Vision? 0  Difficulty concentrating or making decisions? 0  Walking or climbing stairs? 0  Dressing or bathing? 0  Doing errands, shopping? 1   Most recent fall risk assessment:    03/02/2022   11:03 AM  Fall Risk   Falls in the past year? 0  Risk for fall due to : No Fall Risks  Follow up Falls prevention discussed;Education provided;Falls evaluation  completed    Most recent depression screenings:    03/02/2022   11:02 AM 02/26/2021   11:00 AM  PHQ 2/9 Scores  PHQ - 2 Score 0 0  PHQ- 9 Score 0 0   Most recent cognitive screening:    03/02/2022   11:03 AM  6CIT Screen  What Year? 0 points  What month? 0 points  What time? 0 points  Count back from 20 0 points  Months in reverse 0 points  Repeat phrase 0 points  Total Score 0 points   Most recent Audit-C alcohol use screening    03/02/2022   11:01 AM  Alcohol Use Disorder Test (AUDIT)  1. How often do you have a drink containing alcohol? 0   A score of 3 or more in women, and 4 or more in men indicates increased risk for alcohol abuse, EXCEPT if all of the points are from question 1   Vision/Hearing Screen: No results found.  Last CBC Lab Results  Component Value Date   WBC 9.5 08/24/2021   HGB 15.5 08/24/2021   HCT 44.7 08/24/2021   MCV 93.3 08/24/2021   MCH 32.4 08/24/2021   RDW 12.2 08/24/2021   PLT 173 08/24/2021   Last metabolic panel Lab Results  Component Value Date   GLUCOSE 98 08/24/2021   NA 138 08/24/2021   K 3.8 08/24/2021   CL 102 08/24/2021   CO2 28 08/24/2021   BUN 7 (L) 08/24/2021   CREATININE 0.83 08/24/2021   GFRNONAA >60 08/24/2021   CALCIUM 9.5 08/24/2021   PROT 7.1 08/26/2021   ALBUMIN 4.0 08/26/2021   BILITOT 1.0 08/26/2021   ALKPHOS 55 08/26/2021   AST 18 08/26/2021   ALT 15 08/26/2021   ANIONGAP 8 08/24/2021   Last lipids Lab Results  Component Value Date   CHOL 142 02/07/2021   HDL 54 02/07/2021   LDLCALC 79 02/07/2021   TRIG 44 02/07/2021   CHOLHDL 2.6 02/07/2021   Last hemoglobin A1c Lab Results  Component Value Date   HGBA1C 4.5 (L) 02/07/2021      No results found for any visits on 03/02/22.    Assessment & Plan   Annual wellness visit done today including the all of the following: Reviewed patient's Family Medical History Reviewed and updated list of patient's medical providers Assessment of  cognitive impairment was done Assessed patient's functional ability Established a written schedule for health screening services Health Risk Assessent Completed and Reviewed  Exercise Activities and Dietary recommendations  Goals       DIET - EAT MORE FRUITS AND VEGETABLES (pt-stated)  Immunization History  Administered Date(s) Administered   Influenza, High Dose Seasonal PF 01/28/2017   Moderna Sars-Covid-2 Vaccination 06/04/2019, 07/04/2019    Health Maintenance  Topic Date Due   Medicare Annual Wellness (AWV)  02/26/2022   COVID-19 Vaccine (3 - Moderna risk series) 03/18/2022 (Originally 08/01/2019)   Pneumonia Vaccine 36+ Years old (1 - PCV) 05/07/2022 (Originally 04/22/2010)   Zoster Vaccines- Shingrix (1 of 2) 06/02/2022 (Originally 04/22/1964)   INFLUENZA VACCINE  07/25/2022 (Originally 11/24/2021)   TETANUS/TDAP  04/26/2026   Hepatitis C Screening  Completed   HPV VACCINES  Aged Out   COLONOSCOPY (Pts 45-61yrs Insurance coverage will need to be confirmed)  Discontinued   Advanced Care Planning: A voluntary discussion about advance care planning including the explanation and discussion of advance directives.  Discussed health care proxy and Living will, and the patient was able to identify a health care proxy as son, Aline Brochure [email or web address].  Patient does have a living will at present time. If patient does have living will, I have requested they bring this to the clinic to be scanned in to their chart.  Discussed health benefits of physical activity, and encouraged him to engage in regular exercise appropriate for his age and condition.    Problem List Items Addressed This Visit   None Visit Diagnoses     Annual physical exam    -  Primary   Relevant Orders   Lipid panel       Return in about 1 year (around 03/03/2023) for awv.     Myles Gip, DO

## 2022-03-02 NOTE — Patient Instructions (Addendum)
Things to do to keep yourself healthy  - Exercise at least 30-45 minutes a day, 3-4 days a week.  - Eat a low-fat diet with lots of fruits and vegetables, up to 7-9 servings per day.  - Seatbelts can save your life. Wear them always.  - Smoke detectors on every level of your home, check batteries every year.  - Eye Doctor - have an eye exam every 1-2 years  - Safe sex - if you may be exposed to STDs, use a condom.  - Alcohol -  If you drink, do it moderately, less than 2 drinks per day.  - [place]. Choose someone to speak for you if you are not able. https://www.prepareforyourcare.org is a great website to help you navigate this. - Depression is common in our stressful world.If you're feeling down or losing interest in things you normally enjoy, please come in for a visit.  - Violence - If anyone is threatening or hurting you, please call immediately.  Check with your pharmacy about the shingles, pneumonia vaccines and COVID booster and flu shot. Get your cholesterol checked with your other labs.  Bring a copy of your advanced directives to the office.

## 2022-03-15 ENCOUNTER — Other Ambulatory Visit: Payer: Self-pay

## 2022-03-15 MED ORDER — METHOTREXATE SODIUM 2.5 MG PO TABS: 10.0000 mg | ORAL_TABLET | ORAL | 1 refills | Status: DC

## 2022-03-15 MED ORDER — METHOTREXATE SODIUM 2.5 MG PO TABS: 10.0000 mg | ORAL_TABLET | ORAL | 2 refills | Status: DC

## 2022-03-17 ENCOUNTER — Other Ambulatory Visit: Payer: Self-pay

## 2022-03-17 MED ORDER — METHOTREXATE SODIUM 2.5 MG PO TABS: 10.0000 mg | ORAL_TABLET | ORAL | 1 refills | Status: DC

## 2022-04-02 ENCOUNTER — Other Ambulatory Visit
Admission: RE | Admit: 2022-04-02 | Discharge: 2022-04-02 | Disposition: A | Discharge status: Home / Self Care | Payer: Medicare PPO | Attending: Pulmonary Disease | Admitting: Pulmonary Disease

## 2022-04-02 ENCOUNTER — Ambulatory Visit: Payer: Medicare PPO | Admitting: Pulmonary Disease

## 2022-04-02 ENCOUNTER — Encounter: Payer: Self-pay | Admitting: Pulmonary Disease

## 2022-04-02 VITALS — BP 124/84 | HR 70 | Temp 97.8°F | Ht 71.0 in | Wt 179.0 lb

## 2022-04-02 DIAGNOSIS — Z5181 Encounter for therapeutic drug level monitoring: Secondary | ICD-10-CM | POA: Diagnosis not present

## 2022-04-02 DIAGNOSIS — D869 Sarcoidosis, unspecified: Secondary | ICD-10-CM | POA: Diagnosis not present

## 2022-04-02 DIAGNOSIS — M899 Disorder of bone, unspecified: Secondary | ICD-10-CM

## 2022-04-02 LAB — CBC WITH DIFFERENTIAL/PLATELET
Abs Immature Granulocytes: 0.04 10*3/uL (ref 0.00–0.07)
Basophils Absolute: 0 10*3/uL (ref 0.0–0.1)
Basophils Relative: 0 %
Eosinophils Absolute: 0.1 10*3/uL (ref 0.0–0.5)
Eosinophils Relative: 2 %
HCT: 44.7 % (ref 39.0–52.0)
Hemoglobin: 15.3 g/dL (ref 13.0–17.0)
Immature Granulocytes: 1 %
Lymphocytes Relative: 19 %
Lymphs Abs: 1.3 10*3/uL (ref 0.7–4.0)
MCH: 32.5 pg (ref 26.0–34.0)
MCHC: 34.2 g/dL (ref 30.0–36.0)
MCV: 94.9 fL (ref 80.0–100.0)
Monocytes Absolute: 0.8 10*3/uL (ref 0.1–1.0)
Monocytes Relative: 11 %
Neutro Abs: 4.7 10*3/uL (ref 1.7–7.7)
Neutrophils Relative %: 67 %
Platelets: 159 10*3/uL (ref 150–400)
RBC: 4.71 MIL/uL (ref 4.22–5.81)
RDW: 12.8 % (ref 11.5–15.5)
WBC: 6.9 10*3/uL (ref 4.0–10.5)
nRBC: 0 % (ref 0.0–0.2)

## 2022-04-02 LAB — COMPREHENSIVE METABOLIC PANEL
ALT: 15 U/L (ref 0–44)
AST: 18 U/L (ref 15–41)
Albumin: 4.5 g/dL (ref 3.5–5.0)
Alkaline Phosphatase: 72 U/L (ref 38–126)
Anion gap: 7 (ref 5–15)
BUN: 9 mg/dL (ref 8–23)
CO2: 28 mmol/L (ref 22–32)
Calcium: 9.7 mg/dL (ref 8.9–10.3)
Chloride: 106 mmol/L (ref 98–111)
Creatinine, Ser: 0.79 mg/dL (ref 0.61–1.24)
GFR, Estimated: >60 mL/min (ref >60)
Glucose, Bld: 99 mg/dL (ref 70–99)
Potassium: 4 mmol/L (ref 3.5–5.1)
Sodium: 141 mmol/L (ref 135–145)
Total Bilirubin: 1.5 mg/dL — ABNORMAL HIGH (ref 0.3–1.2)
Total Protein: 8 g/dL (ref 6.5–8.1)

## 2022-04-02 NOTE — Progress Notes (Signed)
Subjective:    Patient ID: KRIS NO, male    DOB: [birth year], 76 y.o.   MRN: [id number] Patient Care Team: Bo Merino, FNP as PCP - General (Nurse Practitioner) Tyler Pita, MD as Consulting Physician (Pulmonary Disease)  Chief Complaint  Patient presents with   Follow-up    No SOB, wheezing or cough.     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 Initially evaluated because of mediastinal adenopathy.  He was last seen here on 19 February 2022 at his prior visit methotrexate was increased to 10 mg weekly and 5 mg of prednisone daily.  He had CBC and c-Met performed today.  CBC is normal.  C-Met only showed a slight increase in bilirubin at 1.5 but otherwise completely normal.  Recall that previously bone involvement of sarcoidosis was noted on the left ilium noted by PET/CT and MRI.  He had biopsy of this area with findings of the nonnecrotizing granulomas consistent with sarcoid granulomas with AFB and fungal stains negative.  Initially he had left hip pain but this has now resolved and continues to be quiescent.  He has not had any shortness of breath, fevers, chills or sweats.  He has not had any cough or sputum production.    Patient presents today with his brother.  They both seem to have a little bit better understanding of the disease process. His most recent PET/CT was performed on 05 January 2022 this showed partially calcified hypermetabolic mediastinal hilar and internal mammary lymph nodes as well as periportal lymph nodes.  There is hypermetabolic lateral left clavicular lesion that is stable.  The left iliac wing lesion is slightly less hypermetabolic.  All of the findings are compatible with biopsy-proven sarcoid.  He has not exhibited any hypercalcemia.  No polydipsia or polyuria.  Appetite is good.  No weight loss.   Review of Systems A 10 point review of systems was  performed and it is as noted above otherwise negative.  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 Swallow whole.   Cholecalciferol (VITAMIN D3 PO) Take 1 tablet by mouth every other day.   methotrexate (RHEUMATREX) 2.5 MG tablet Take 4 tablets (10 mg total) by mouth once a week. Caution:Chemotherapy. Protect from light.   Polyethyl Glycol-Propyl Glycol (SYSTANE) 0.4-0.3 % SOLN Apply to eye daily.   predniSONE (DELTASONE) 5 MG tablet Take 1 tablet (5 mg total) by mouth daily with breakfast.   Immunization History  Administered Date(s) Administered   Influenza, High Dose Seasonal PF 01/28/2017   Moderna Sars-Covid-2 Vaccination 06/04/2019, 07/04/2019       Objective:   Physical Exam BP 124/84 (BP Location: Left Arm, Cuff  Size: Normal)   Pulse 70   Temp 97.8 F (36.6 C)   Ht _0  (1.803 m)   Wt 179 lb (81.2 kg)   SpO2 100%   BMI 24.97 kg/m  GENERAL: Frail appearing gentleman, no acute distress.  Well-nourished well-developed, ambulates with assistance of a cane.  No respiratory distress, no conversational dyspnea. HEAD: Normocephalic, atraumatic.  EYES: Pupils equal, round, reactive to light.  No scleral icterus.  MOUTH: Nose/mouth/throat not examined due to masking requirements for  COVID 19. NECK: Supple. No thyromegaly. Trachea midline. No JVD.  No adenopathy. PULMONARY: Good air entry bilaterally.  No adventitious sounds. CARDIOVASCULAR: S1 and S2. Regular rate and rhythm.  No rubs, murmurs or gallops heard. ABDOMEN: Benign. MUSCULOSKELETAL: No joint deformity, no clubbing, no edema.  NEUROLOGIC: No focal deficit, no gait disturbance, speech is fluent. SKIN: Intact,warm,dry. PSYCH: Mood and behavior normal.      Assessment & Plan:     ICD-10-CM   1. Sarcoidosis  D86.9 Comprehensive metabolic panel    CBC with Differential/Platelet   Continue methotrexate at 10 mg weekly Continue prednisone, drop to 5 mg every other day Follow-up 2 months with CBC/CMET    2. Bone lesion  M89.9    Biopsy-proven due to sarcoidosis No hip pain Continue treatment as above Improving as of last PET CT    3. Medication monitoring encounter  Z51.81 Comprehensive metabolic panel    CBC with Differential/Platelet   Will obtain CBC/CMET on follow-up appointment     Orders Placed This Encounter  Procedures   Comprehensive metabolic panel    Standing Status:   Future    Standing Expiration Date:   04/03/2023   CBC with Differential/Platelet    Standing Status:   Future    Standing Expiration Date:   04/03/2023   The patient is doing well.  He is tolerating his current methotrexate therapy.  Will see him in follow-up in 2 months time he is to contact us prior to that time should any new difficulties arise.  Renold Don, MD Advanced Bronchoscopy PCCM [facility]    *This note was dictated using voice recognition software/Dragon.  Despite best efforts to proofread, errors can occur which can change the meaning. Any transcriptional errors that result from this process are unintentional and may not be fully corrected at the time of dictation.

## 2022-04-02 NOTE — Patient Instructions (Signed)
Your blood work looks very good today.  Continue the methotrexate 4 tablets (10 mg total) once a week.  Continue prednisone 5 mg however take every other day.  We will see you in follow-up in 2 months time.  Call sooner should any new problems arise.  We will do blood work as we did today on the day of follow-up appointment.

## 2022-04-22 ENCOUNTER — Other Ambulatory Visit: Payer: Self-pay

## 2022-04-22 NOTE — Telephone Encounter (Signed)
Received Rx request from [facility] [address] for Methotrexate.  Rx has been denied, as Rx was [facility] on [facility]/17/2022 with 1 refill. Nothing further needed.

## 2022-04-27 ENCOUNTER — Telehealth: Payer: Self-pay | Admitting: Pulmonary Disease

## 2022-04-27 NOTE — Telephone Encounter (Signed)
Patient is aware that an appt is not needed for [facility] lab.  He voiced his understanding and had no further questions.  Nothing further needed.

## 2022-04-27 NOTE — Telephone Encounter (Signed)
Can you make a bldwk appt for this pt for 9:30 AM on 2/20. He would like to do it before his appt w/Dr. Patsey Berthold. Thanks.

## 2022-06-08 ENCOUNTER — Other Ambulatory Visit: Payer: Self-pay | Admitting: Pulmonary Disease

## 2022-06-15 ENCOUNTER — Other Ambulatory Visit
Admission: RE | Admit: 2022-06-15 | Discharge: 2022-06-15 | Disposition: A | Discharge status: Home / Self Care | Payer: Medicare PPO | Attending: Pulmonary Disease | Admitting: Pulmonary Disease

## 2022-06-15 ENCOUNTER — Encounter: Payer: Self-pay | Admitting: Pulmonary Disease

## 2022-06-15 ENCOUNTER — Ambulatory Visit: Payer: Medicare PPO | Admitting: Pulmonary Disease

## 2022-06-15 VITALS — BP 138/86 | HR 67 | Temp 97.7°F | Ht 71.0 in | Wt 178.2 lb

## 2022-06-15 DIAGNOSIS — Z5181 Encounter for therapeutic drug level monitoring: Secondary | ICD-10-CM | POA: Diagnosis not present

## 2022-06-15 DIAGNOSIS — D869 Sarcoidosis, unspecified: Secondary | ICD-10-CM

## 2022-06-15 DIAGNOSIS — M899 Disorder of bone, unspecified: Secondary | ICD-10-CM | POA: Diagnosis not present

## 2022-06-15 LAB — COMPREHENSIVE METABOLIC PANEL
ALT: 15 U/L (ref 0–44)
AST: 19 U/L (ref 15–41)
Albumin: 4.4 g/dL (ref 3.5–5.0)
Alkaline Phosphatase: 66 U/L (ref 38–126)
Anion gap: 6 (ref 5–15)
BUN: 8 mg/dL (ref 8–23)
CO2: 27 mmol/L (ref 22–32)
Calcium: 9 mg/dL (ref 8.9–10.3)
Chloride: 101 mmol/L (ref 98–111)
Creatinine, Ser: 0.81 mg/dL (ref 0.61–1.24)
GFR, Estimated: >60 mL/min (ref >60)
Glucose, Bld: 97 mg/dL (ref 70–99)
Potassium: 3.8 mmol/L (ref 3.5–5.1)
Sodium: 134 mmol/L — ABNORMAL LOW (ref 135–145)
Total Bilirubin: 1.5 mg/dL — ABNORMAL HIGH (ref 0.3–1.2)
Total Protein: 7.3 g/dL (ref 6.5–8.1)

## 2022-06-15 LAB — CBC WITH DIFFERENTIAL/PLATELET
Abs Immature Granulocytes: 0.02 10*3/uL (ref 0.00–0.07)
Basophils Absolute: 0 10*3/uL (ref 0.0–0.1)
Basophils Relative: 0 %
Eosinophils Absolute: 0.1 10*3/uL (ref 0.0–0.5)
Eosinophils Relative: 2 %
HCT: 41.7 % (ref 39.0–52.0)
Hemoglobin: 14.5 g/dL (ref 13.0–17.0)
Immature Granulocytes: 0 %
Lymphocytes Relative: 25 %
Lymphs Abs: 1.4 10*3/uL (ref 0.7–4.0)
MCH: 32.8 pg (ref 26.0–34.0)
MCHC: 34.8 g/dL (ref 30.0–36.0)
MCV: 94.3 fL (ref 80.0–100.0)
Monocytes Absolute: 0.6 10*3/uL (ref 0.1–1.0)
Monocytes Relative: 10 %
Neutro Abs: 3.4 10*3/uL (ref 1.7–7.7)
Neutrophils Relative %: 63 %
Platelets: 147 10*3/uL — ABNORMAL LOW (ref 150–400)
RBC: 4.42 MIL/uL (ref 4.22–5.81)
RDW: 13 % (ref 11.5–15.5)
WBC: 5.6 10*3/uL (ref 4.0–10.5)
nRBC: 0 % (ref 0.0–0.2)

## 2022-06-15 NOTE — Progress Notes (Unsigned)
Subjective:    Patient ID: Colin Barron, male    DOB: 09/03/1945, 77 y.o.   MRN: [id number] Patient Care Team: Bo Merino, FNP as PCP - General (Nurse Practitioner) Tyler Pita, MD as Consulting Physician (Pulmonary Disease)  Chief Complaint  Patient presents with   Follow-up    No SOB, wheezing, or cough.    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 Initially evaluated because of mediastinal adenopathy.  He was last seen here on 02 April 2022 at this visit methotrexate was maintained at that 10 mg weekly and 5 mg of prednisone daily.  He had CBC and c-Met performed today.  Results are similar prior on 8 December no major abnormalities.  Recall that previously bone involvement of sarcoidosis was noted on the left ilium noted by PET/CT and MRI.  He had biopsy of this area with findings of the nonnecrotizing granulomas consistent with sarcoid granulomas with AFB and fungal stains negative.  Initially he had left hip pain but this has now resolved and continues to be quiescent.  He has not had any shortness of breath, fevers, chills or sweats.  He has not had any cough or sputum production.    Patient presents today with his brother.  They both seem to have a little bit better understanding of the disease process. His most recent PET/CT was performed on 05 January 2022 this showed partially calcified hypermetabolic mediastinal hilar and internal mammary lymph nodes as well as periportal lymph nodes.  There is hypermetabolic lateral left clavicular lesion that is stable.  The left iliac wing lesion is slightly less hypermetabolic.  All of the findings are compatible with biopsy-proven sarcoid.  He has not exhibited any hypercalcemia.  No polydipsia or polyuria.  Appetite is good.  No weight loss.  We will repeat PET/CT September 2024.   Review of Systems A 10 point review of systems was  performed and it is as noted above otherwise negative.  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 Swallow whole.   b complex vitamins capsule Take 1 capsule by mouth daily.   Cholecalciferol (VITAMIN D3 PO) Take 1 tablet by mouth every other day.   methotrexate (RHEUMATREX) 2.5 MG tablet Take 4 tablets (10 mg total) by mouth once a week. Caution:Chemotherapy. Protect from light.   Polyethyl Glycol-Propyl Glycol (SYSTANE) 0.4-0.3 % SOLN Apply to eye daily.   predniSONE (DELTASONE) 5 MG tablet TAKE 1 TABLET BY MOUTH EVERY DAY WITH BREAKFAST   Immunization History  Administered Date(s) Administered   Influenza, High Dose Seasonal PF 01/28/2017   Moderna Sars-Covid-2 Vaccination 06/04/2019, 07/04/2019       Objective:   Physical  Exam BP 138/86 (BP Location: Right Arm, Cuff Size: Normal)   Pulse 67   Temp 97.7 F (36.5 C)   Ht 5' 11"$  (1.803 m)   Wt 178 lb 3.2 oz (80.8 kg)   SpO2 100%   BMI 24.85 kg/m   SpO2: 100 % O2 Device: None (Room air)  GENERAL: Frail appearing gentleman, no acute distress.  Well-nourished well-developed, ambulates with assistance of a cane.  No respiratory distress, no conversational dyspnea. HEAD: Normocephalic, atraumatic.  EYES: Pupils equal, round, reactive  to light.  No scleral icterus.  MOUTH: Nose/mouth/throat not examined due to masking requirements for COVID 19. NECK: Supple. No thyromegaly. Trachea midline. No JVD.  No adenopathy. PULMONARY: Good air entry bilaterally.  No adventitious sounds. CARDIOVASCULAR: S1 and S2. Regular rate and rhythm.  No rubs, murmurs or gallops heard. ABDOMEN: Benign. MUSCULOSKELETAL: No joint deformity, no clubbing, no edema.  NEUROLOGIC: No focal deficit, no gait disturbance, speech is fluent. SKIN: Intact,warm,dry. PSYCH: Mood and behavior normal.   Recent Results (from the past 2160 hour(s))  Comprehensive metabolic panel     Status: Abnormal   Collection Time: 04/02/22  9:56 AM  Result Value Ref Range   Sodium 141 135 - 145 mmol/L   Potassium 4.0 3.5 - 5.1 mmol/L   Chloride 106 98 - 111 mmol/L   CO2 28 22 - 32 mmol/L   Glucose, Bld 99 70 - 99 mg/dL    Comment: Glucose reference range applies only to samples taken after fasting for at least 8 hours.   BUN 9 8 - 23 mg/dL   Creatinine, Ser 0.79 0.61 - 1.24 mg/dL   Calcium 9.7 8.9 - 10.3 mg/dL   Total Protein 8.0 6.5 - 8.1 g/dL   Albumin 4.5 3.5 - 5.0 g/dL   AST 18 15 - 41 U/L   ALT 15 0 - 44 U/L   Alkaline Phosphatase 72 38 - 126 U/L   Total Bilirubin 1.5 (H) 0.3 - 1.2 mg/dL   GFR, Estimated >60 >60 mL/min    Comment: (NOTE) Calculated using the CKD-EPI Creatinine Equation (2021)    Anion gap 7 5 - 15    Comment: Performed at [facility], [address] [place], [place] [address]  CBC with Differential/Platelet     Status: None   Collection Time: 04/02/22  9:56 AM  Result Value Ref Range   WBC 6.9 4.0 - 10.5 K/uL   RBC 4.71 4.22 - 5.81 MIL/uL   Hemoglobin 15.3 13.0 - 17.0 g/dL   HCT 44.7 39.0 - 52.0 %   MCV 94.9 80.0 - 100.0 fL   MCH 32.5 26.0 - 34.0 pg   MCHC 34.2 30.0 - 36.0 g/dL   RDW 12.8 11.5 - 15.5 %   Platelets 159 150 - 400 K/uL   nRBC 0.0 0.0 - 0.2 %   Neutrophils Relative % 67 %   Neutro Abs 4.7 1.7 - 7.7 K/uL    Lymphocytes Relative 19 %   Lymphs Abs 1.3 0.7 - 4.0 K/uL   Monocytes Relative 11 %   Monocytes Absolute 0.8 0.1 - 1.0 K/uL   Eosinophils Relative 2 %   Eosinophils Absolute 0.1 0.0 - 0.5 K/uL   Basophils Relative 0 %   Basophils Absolute 0.0 0.0 - 0.1 K/uL   Immature Granulocytes 1 %   Abs Immature Granulocytes 0.04 0.00 - 0.07 K/uL    Comment: Performed at [facility], [address] [place], [place] [address]  Comprehensive metabolic panel  Status: Abnormal   Collection Time: 06/15/22  9:53 AM  Result Value Ref Range   Sodium 134 (L) 135 - 145 mmol/L   Potassium 3.8 3.5 - 5.1 mmol/L   Chloride 101 98 - 111 mmol/L   CO2 27 22 - 32 mmol/L   Glucose, Bld 97 70 - 99 mg/dL    Comment: Glucose reference range applies only to samples taken after fasting for at least 8 hours.   BUN 8 8 - 23 mg/dL   Creatinine, Ser 0.81 0.61 - 1.24 mg/dL   Calcium 9.0 8.9 - 10.3 mg/dL   Total Protein 7.3 6.5 - 8.1 g/dL   Albumin 4.4 3.5 - 5.0 g/dL   AST 19 15 - 41 U/L   ALT 15 0 - 44 U/L   Alkaline Phosphatase 66 38 - 126 U/L   Total Bilirubin 1.5 (H) 0.3 - 1.2 mg/dL   GFR, Estimated >60 >60 mL/min    Comment: (NOTE) Calculated using the CKD-EPI Creatinine Equation (2021)    Anion gap 6 5 - 15    Comment: Performed at [facility], [address] [place], [place] [address]  CBC with Differential/Platelet     Status: Abnormal   Collection Time: 06/15/22  9:53 AM  Result Value Ref Range   WBC 5.6 4.0 - 10.5 K/uL   RBC 4.42 4.22 - 5.81 MIL/uL   Hemoglobin 14.5 13.0 - 17.0 g/dL   HCT 41.7 39.0 - 52.0 %   MCV 94.3 80.0 - 100.0 fL   MCH 32.8 26.0 - 34.0 pg   MCHC 34.8 30.0 - 36.0 g/dL   RDW 13.0 11.5 - 15.5 %   Platelets 147 (L) 150 - 400 K/uL   nRBC 0.0 0.0 - 0.2 %   Neutrophils Relative % 63 %   Neutro Abs 3.4 1.7 - 7.7 K/uL   Lymphocytes Relative 25 %   Lymphs Abs 1.4 0.7 - 4.0 K/uL   Monocytes Relative 10 %   Monocytes Absolute 0.6 0.1 - 1.0 K/uL    Eosinophils Relative 2 %   Eosinophils Absolute 0.1 0.0 - 0.5 K/uL   Basophils Relative 0 %   Basophils Absolute 0.0 0.0 - 0.1 K/uL   Immature Granulocytes 0 %   Abs Immature Granulocytes 0.02 0.00 - 0.07 K/uL    Comment: Performed at [facility], [address], [place], [place] [address]       Assessment & Plan:     ICD-10-CM   1. Sarcoidosis  D86.9 CBC w/Diff    Comprehensive metabolic panel   Mediastinal adenopathy plus bone involvement Continue methotrexate 10 mg weekly Continue prednisone 5 mg daily Repeat PET/CT September 2024    2. Bone lesion  M89.9    Due to sarcoidosis involvement Methotrexate/prednisone as above    3. Therapeutic drug monitoring  Z51.81 CBC w/Diff    Comprehensive metabolic panel   CBC and CMET on return visit     Orders Placed This Encounter  Procedures   CBC w/Diff    Standing Status:   Future    Standing Expiration Date:   06/16/2023   Comprehensive metabolic panel    Standing Status:   Future    Standing Expiration Date:   06/16/2023   We will see the patient in follow-up in 3 to 4 months time he is to have blood drawn on follow-up appointment.  He is to contact us prior to that time should any new difficulties arise.  Renold Don, MD Advanced  Bronchoscopy PCCM [facility]    *This note was dictated using voice recognition software/Dragon.  Despite best efforts to proofread, errors can occur which can change the meaning. Any transcriptional errors that result from this process are unintentional and may not be fully corrected at the time of dictation.

## 2022-06-15 NOTE — Patient Instructions (Addendum)
Continue taking your methotrexate and your prednisone as you are doing.  Your blood work looked good today.  We will see you in follow-up in 3 to 4 months.  We will get blood work on the day of your return appointment as you did today.

## 2022-06-15 NOTE — Addendum Note (Signed)
Addended by: Marty Heck on: 06/15/2022 09:40 AM   Modules accepted: Orders

## 2022-09-04 ENCOUNTER — Other Ambulatory Visit: Payer: Self-pay | Admitting: Pulmonary Disease

## 2022-09-15 ENCOUNTER — Ambulatory Visit: Payer: Medicare PPO | Admitting: Pulmonary Disease

## 2022-09-15 ENCOUNTER — Encounter: Payer: Self-pay | Admitting: Pulmonary Disease

## 2022-09-15 ENCOUNTER — Other Ambulatory Visit
Admission: RE | Admit: 2022-09-15 | Discharge: 2022-09-15 | Disposition: A | Discharge status: Home / Self Care | Payer: Medicare PPO | Source: Ambulatory Visit | Attending: Pulmonary Disease | Admitting: Pulmonary Disease

## 2022-09-15 VITALS — BP 120/80 | HR 71 | Temp 98.2°F | Ht 71.0 in | Wt 169.8 lb

## 2022-09-15 DIAGNOSIS — D8686 Sarcoid arthropathy: Secondary | ICD-10-CM

## 2022-09-15 DIAGNOSIS — Z5181 Encounter for therapeutic drug level monitoring: Secondary | ICD-10-CM | POA: Diagnosis present

## 2022-09-15 DIAGNOSIS — D869 Sarcoidosis, unspecified: Secondary | ICD-10-CM | POA: Diagnosis present

## 2022-09-15 DIAGNOSIS — D862 Sarcoidosis of lung with sarcoidosis of lymph nodes: Secondary | ICD-10-CM | POA: Diagnosis not present

## 2022-09-15 LAB — CBC WITH DIFFERENTIAL/PLATELET
Abs Immature Granulocytes: 0.2 10*3/uL — ABNORMAL HIGH (ref 0.00–0.07)
Basophils Absolute: 0 10*3/uL (ref 0.0–0.1)
Basophils Relative: 0 %
Eosinophils Absolute: 0.1 10*3/uL (ref 0.0–0.5)
Eosinophils Relative: 1 %
HCT: 40.1 % (ref 39.0–52.0)
Hemoglobin: 13.6 g/dL (ref 13.0–17.0)
Immature Granulocytes: 2 %
Lymphocytes Relative: 18 %
Lymphs Abs: 1.6 10*3/uL (ref 0.7–4.0)
MCH: 34.2 pg — ABNORMAL HIGH (ref 26.0–34.0)
MCHC: 33.9 g/dL (ref 30.0–36.0)
MCV: 100.8 fL — ABNORMAL HIGH (ref 80.0–100.0)
Monocytes Absolute: 0.5 10*3/uL (ref 0.1–1.0)
Monocytes Relative: 5 %
Neutro Abs: 7 10*3/uL (ref 1.7–7.7)
Neutrophils Relative %: 74 %
Platelets: 142 10*3/uL — ABNORMAL LOW (ref 150–400)
RBC: 3.98 MIL/uL — ABNORMAL LOW (ref 4.22–5.81)
RDW: 14.2 % (ref 11.5–15.5)
WBC: 9.4 10*3/uL (ref 4.0–10.5)
nRBC: 0 % (ref 0.0–0.2)

## 2022-09-15 LAB — COMPREHENSIVE METABOLIC PANEL
ALT: 13 U/L (ref 0–44)
AST: 16 U/L (ref 15–41)
Albumin: 4.3 g/dL (ref 3.5–5.0)
Alkaline Phosphatase: 67 U/L (ref 38–126)
Anion gap: 6 (ref 5–15)
BUN: 10 mg/dL (ref 8–23)
CO2: 27 mmol/L (ref 22–32)
Calcium: 9.4 mg/dL (ref 8.9–10.3)
Chloride: 106 mmol/L (ref 98–111)
Creatinine, Ser: 0.87 mg/dL (ref 0.61–1.24)
GFR, Estimated: >60 mL/min (ref >60)
Glucose, Bld: 97 mg/dL (ref 70–99)
Potassium: 4.6 mmol/L (ref 3.5–5.1)
Sodium: 139 mmol/L (ref 135–145)
Total Bilirubin: 1.6 mg/dL — ABNORMAL HIGH (ref 0.3–1.2)
Total Protein: 7.5 g/dL (ref 6.5–8.1)

## 2022-09-15 NOTE — Progress Notes (Signed)
Subjective:    Patient ID: Colin Barron, male    DOB: [birth year], 77 y.o.   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 Initially evaluated because of mediastinal adenopathy.  He was last seen here on 28 May 2022 at this visit methotrexate was maintained at that 10 mg weekly and 5 mg of prednisone daily.  Presents today for a scheduled follow-up visit.  He is here with his brother.  He had CBC and CMET performed today.  Results are similar prior no major abnormalities, mild elevation of MCV will need to continue to monitor.  Recall that previously bone involvement of sarcoidosis was noted on the left ilium noted by PET/CT and MRI (04/2021).  He had biopsy of this area (05/2021) with findings of the nonnecrotizing granulomas consistent with sarcoid granulomas with AFB and fungal stains negative.  Initially he had left hip pain but this has now resolved after initiation of therapy with methotrexate, and continues to be quiescent. He has not had any shortness of breath, fevers, chills or sweats.  He has not had any cough or sputum production.    He has not exhibited any hypercalcemia.  No polydipsia or polyuria.  Appetite is good.  No weight loss.  We will repeat PET/CT September 2024 this has been ordered.   Review of Systems A 10 point review of systems was performed and it is as noted above otherwise negative.  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 Swallow whole.   b complex vitamins capsule Take 1 capsule by mouth daily.   Cholecalciferol (VITAMIN D3 PO) Take 1 tablet by mouth every other day.   methotrexate (RHEUMATREX) 2.5 MG tablet Take 4 tablets (10 mg total) by mouth once a week. Caution:Chemotherapy. Protect from light.   Polyethyl Glycol-Propyl Glycol (SYSTANE) 0.4-0.3 % SOLN Apply to eye daily.   predniSONE (DELTASONE) 5 MG tablet TAKE 1 TABLET BY MOUTH EVERY DAY WITH BREAKFAST   Immunization History  Administered Date(s) Administered   Influenza, High Dose Seasonal PF 01/28/2017   Moderna Sars-Covid-2 Vaccination 06/04/2019, 07/04/2019       Objective:   Physical Exam BP 120/80 (BP Location: Left Arm, Cuff Size: Normal)   Pulse 71   Temp 98.2 F (36.8 C)   Ht 5\' 11"  (1.803 m)   Wt 169 lb 12.8 oz (77 kg)   SpO2 100%   BMI 23.68 kg/m   SpO2: 100 % O2 Device: None (Room air)  GENERAL: Frail appearing gentleman, no acute distress.  Well-nourished well-developed, ambulates with assistance of a cane.  No respiratory distress, no conversational dyspnea. HEAD: Normocephalic, atraumatic.  EYES: Pupils equal, round, reactive to  light.  No scleral icterus.  MOUTH: Nose/mouth/throat not examined due to masking requirements for COVID 19. NECK: Supple. No thyromegaly. Trachea midline. No JVD.  No adenopathy. PULMONARY: Good air entry bilaterally.  No adventitious sounds. CARDIOVASCULAR: S1 and S2. Regular rate and rhythm.  No rubs, murmurs or gallops heard. ABDOMEN: Benign. MUSCULOSKELETAL: No joint deformity, no clubbing, no edema.  NEUROLOGIC: No focal deficit, no gait disturbance, speech is fluent. SKIN: Intact,warm,dry. PSYCH: Mood and behavior normal.    Recent Results (from the past 2160 hour(s))  CBC w/Diff     Status: Abnormal   Collection Time: 09/15/22  9:44 AM  Result Value Ref Range   WBC 9.4 4.0 - 10.5 K/uL   RBC 3.98 (L) 4.22 - 5.81 MIL/uL   Hemoglobin 13.6 13.0 - 17.0 g/dL   HCT 16.1 09.6 - 04.5 %   MCV 100.8 (H) 80.0 - 100.0 fL   MCH 34.2 (H) 26.0 - 34.0 pg   MCHC 33.9 30.0 - 36.0 g/dL   RDW 40.9 81.1 - 91.4 %   Platelets 142 (L) 150 - 400 K/uL   nRBC 0.0 0.0 - 0.2 %   Neutrophils Relative % 74 %   Neutro Abs 7.0 1.7 - 7.7 K/uL   Lymphocytes Relative 18 %   Lymphs Abs 1.6 0.7 - 4.0 K/uL   Monocytes Relative 5 %   Monocytes Absolute 0.5 0.1 - 1.0 K/uL   Eosinophils Relative 1 %   Eosinophils Absolute 0.1 0.0 - 0.5 K/uL   Basophils Relative 0 %   Basophils Absolute 0.0 0.0 - 0.1 K/uL   Immature Granulocytes 2 %   Abs Immature Granulocytes 0.20 (H) 0.00 - 0.07 K/uL    Comment: Performed at [facility], [address] [place], [place] [address]  Comprehensive metabolic panel     Status: Abnormal   Collection Time: 09/15/22  9:44 AM  Result Value Ref Range   Sodium 139 135 - 145 mmol/L   Potassium 4.6 3.5 - 5.1 mmol/L   Chloride 106 98 - 111 mmol/L   CO2 27 22 - 32 mmol/L   Glucose, Bld 97 70 - 99 mg/dL    Comment: Glucose reference range applies only to samples taken after fasting for at least 8 hours.   BUN 10 8 - 23 mg/dL   Creatinine, Ser 6.21 0.61 - 1.24 mg/dL   Calcium 9.4 8.9 - 30.8 mg/dL   Total Protein 7.5 6.5 - 8.1 g/dL   Albumin 4.3 3.5 - 5.0 g/dL   AST 16 15 - 41 U/L   ALT 13 0 - 44 U/L   Alkaline Phosphatase 67 38 - 126 U/L   Total Bilirubin 1.6 (H) 0.3 - 1.2 mg/dL   GFR, Estimated >65 >78 mL/min    Comment: (NOTE) Calculated using the CKD-EPI Creatinine Equation (2021)    Anion gap 6 5 - 15    Comment: Performed at [facility], [address], [place], [place] [address]   Laboratory results reviewed and discussed with the patient.    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 Medication monitoring encounter  Z51.81    Laboratory data stable Mild elevation in MCV, will monitor     Orders Placed This Encounter  Procedures   NM PET Image Restage (PS) Skull Base to Thigh (F-18 FDG)    To be done in Sept.    Standing Status:   Future    Standing Expiration Date:   09/15/2023    Order Specific Question:   If indicated for the ordered procedure, I authorize the administration of a radiopharmaceutical per Radiology protocol    Answer:   Yes    Order Specific Question:   Preferred imaging location?    Answer:   [facility]   CBC With Differential    Standing Status:   Future    Standing Expiration Date:   03/18/2023   Comprehensive metabolic panel    Standing Status:   Future    Standing Expiration Date:   09/15/2023   Will see the patient in follow-up in 4 months time after PET/CT.  Continue to monitor laboratory parameters.  Gailen Shelter, MD Advanced Bronchoscopy PCCM [facility]    *This note was dictated using voice recognition software/Dragon.  Despite best efforts to proofread, errors can occur which can change the meaning. Any transcriptional errors that result from this process are unintentional and may not be fully corrected at the time of dictation.

## 2022-09-15 NOTE — Addendum Note (Signed)
Addended by: Yehuda Savannah on: 09/15/2022 09:37 AM   Modules accepted: Orders

## 2022-09-15 NOTE — Patient Instructions (Signed)
We will get another bone scan and September 2024.  We will see you after that scan is performed.  We will call you with the results of the blood work done today.  You will have blood work on your follow-up appointment here this will be ordered as well.

## 2022-09-15 NOTE — Addendum Note (Signed)
Addended by: Becka Lagasse C on: 09/15/2022 09:37 AM   Modules accepted: Orders  

## 2022-09-24 ENCOUNTER — Other Ambulatory Visit: Payer: Self-pay | Admitting: Pulmonary Disease

## 2023-01-11 ENCOUNTER — Encounter
Admission: RE | Admit: 2023-01-11 | Discharge: 2023-01-11 | Disposition: A | Discharge status: Home / Self Care | Payer: Medicare PPO | Source: Ambulatory Visit | Attending: Pulmonary Disease | Admitting: Pulmonary Disease

## 2023-01-11 DIAGNOSIS — I2510 Atherosclerotic heart disease of native coronary artery without angina pectoris: Secondary | ICD-10-CM | POA: Diagnosis not present

## 2023-01-11 DIAGNOSIS — D862 Sarcoidosis of lung with sarcoidosis of lymph nodes: Secondary | ICD-10-CM | POA: Insufficient documentation

## 2023-01-11 DIAGNOSIS — R590 Localized enlarged lymph nodes: Secondary | ICD-10-CM | POA: Insufficient documentation

## 2023-01-11 DIAGNOSIS — K8020 Calculus of gallbladder without cholecystitis without obstruction: Secondary | ICD-10-CM | POA: Diagnosis not present

## 2023-01-11 DIAGNOSIS — I700 Atherosclerosis of aorta: Secondary | ICD-10-CM | POA: Insufficient documentation

## 2023-01-11 DIAGNOSIS — R937 Abnormal findings on diagnostic imaging of other parts of musculoskeletal system: Secondary | ICD-10-CM | POA: Insufficient documentation

## 2023-01-11 LAB — GLUCOSE, CAPILLARY: Glucose-Capillary: 71 mg/dL (ref 70–99)

## 2023-01-11 MED ORDER — FLUDEOXYGLUCOSE F - 18 (FDG) INJECTION
8.8000 | Freq: Once | INTRAVENOUS | Status: AC | PRN
  Administered 2023-01-11: 9.22 via INTRAVENOUS

## 2023-01-13 ENCOUNTER — Other Ambulatory Visit
Admission: RE | Admit: 2023-01-13 | Discharge: 2023-01-13 | Disposition: A | Discharge status: Home / Self Care | Payer: Medicare PPO | Source: Ambulatory Visit | Attending: Pulmonary Disease | Admitting: Pulmonary Disease

## 2023-01-13 ENCOUNTER — Encounter: Payer: Self-pay | Admitting: Pulmonary Disease

## 2023-01-13 ENCOUNTER — Other Ambulatory Visit: Payer: Self-pay

## 2023-01-13 ENCOUNTER — Ambulatory Visit: Payer: Medicare PPO | Admitting: Pulmonary Disease

## 2023-01-13 VITALS — BP 118/70 | HR 68 | Temp 97.6°F | Ht 71.0 in | Wt 166.2 lb

## 2023-01-13 DIAGNOSIS — R5382 Chronic fatigue, unspecified: Secondary | ICD-10-CM

## 2023-01-13 DIAGNOSIS — D869 Sarcoidosis, unspecified: Secondary | ICD-10-CM

## 2023-01-13 DIAGNOSIS — Z5181 Encounter for therapeutic drug level monitoring: Secondary | ICD-10-CM

## 2023-01-13 LAB — CBC WITH DIFFERENTIAL/PLATELET
Abs Immature Granulocytes: 0.15 10*3/uL — ABNORMAL HIGH (ref 0.00–0.07)
Basophils Absolute: 0 10*3/uL (ref 0.0–0.1)
Basophils Relative: 0 %
Eosinophils Absolute: 0 10*3/uL (ref 0.0–0.5)
Eosinophils Relative: 0 %
HCT: 38.2 % — ABNORMAL LOW (ref 39.0–52.0)
Hemoglobin: 12.9 g/dL — ABNORMAL LOW (ref 13.0–17.0)
Immature Granulocytes: 2 %
Lymphocytes Relative: 17 %
Lymphs Abs: 1.5 10*3/uL (ref 0.7–4.0)
MCH: 35.8 pg — ABNORMAL HIGH (ref 26.0–34.0)
MCHC: 33.8 g/dL (ref 30.0–36.0)
MCV: 106.1 fL — ABNORMAL HIGH (ref 80.0–100.0)
Monocytes Absolute: 0.3 10*3/uL (ref 0.1–1.0)
Monocytes Relative: 4 %
Neutro Abs: 6.6 10*3/uL (ref 1.7–7.7)
Neutrophils Relative %: 77 %
Platelets: 76 10*3/uL — ABNORMAL LOW (ref 150–400)
RBC: 3.6 MIL/uL — ABNORMAL LOW (ref 4.22–5.81)
RDW: 13.6 % (ref 11.5–15.5)
Smear Review: NORMAL
WBC: 8.6 10*3/uL (ref 4.0–10.5)
nRBC: 0 % (ref 0.0–0.2)

## 2023-01-13 LAB — COMPREHENSIVE METABOLIC PANEL
ALT: 11 U/L (ref 0–44)
AST: 16 U/L (ref 15–41)
Albumin: 4.3 g/dL (ref 3.5–5.0)
Alkaline Phosphatase: 62 U/L (ref 38–126)
Anion gap: 7 (ref 5–15)
BUN: 11 mg/dL (ref 8–23)
CO2: 30 mmol/L (ref 22–32)
Calcium: 9.5 mg/dL (ref 8.9–10.3)
Chloride: 103 mmol/L (ref 98–111)
Creatinine, Ser: 0.77 mg/dL (ref 0.61–1.24)
GFR, Estimated: >60 mL/min (ref >60)
Glucose, Bld: 101 mg/dL — ABNORMAL HIGH (ref 70–99)
Potassium: 3.8 mmol/L (ref 3.5–5.1)
Sodium: 140 mmol/L (ref 135–145)
Total Bilirubin: 1.3 mg/dL — ABNORMAL HIGH (ref 0.3–1.2)
Total Protein: 7.7 g/dL (ref 6.5–8.1)

## 2023-01-13 NOTE — Progress Notes (Signed)
Subjective:    Patient ID: Colin Barron, male    DOB: Aug 28, 1945, 77 y.o.   MRN: [id number]  Patient Care Team: Tally Joe, MD as PCP - General (Family Medicine) Salena Saner, MD as Consulting Physician (Pulmonary Disease)  Chief Complaint  Patient presents with   Follow-up    No SOB, wheezing or cough.     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 Initially evaluated because of mediastinal adenopathy.  He was last seen here on 15 Sep 2022 at this visit methotrexate was maintained at that 10 mg weekly and 5 mg of prednisone daily.  Presents today for a scheduled follow-up visit.  He did not stop by the lab to get his monitoring CBC and CMET today.  He presents with his brother who usually accompanies him on his visits. Recall that previously bone involvement of sarcoidosis was noted on the left ilium noted by PET/CT and MRI (04/2021).  He had biopsy of this area (05/2021) with findings of the nonnecrotizing granulomas consistent with sarcoid granulomas with AFB and fungal stains negative.  Initially he had left hip pain but this has now resolved after initiation of therapy with methotrexate, and continues to be quiescent. He has not had any shortness of breath, fevers, chills or sweats.  He has not had any cough or sputum production.  He does note fatigue.  This has been a chronic issue.   He has not exhibited any hypercalcemia.  No polydipsia or polyuria.  Appetite is good.  No weight loss.  He had a PET/CT performed 11 January 2023 not reviewed by radiology yet.  Review of Systems A 10 point review of systems was performed and it is as noted above otherwise negative.   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 Swallow whole.   b complex vitamins capsule Take 1 capsule by mouth daily.   Cholecalciferol (VITAMIN D3 PO) Take 1 tablet by mouth every other day.   methotrexate (RHEUMATREX) 2.5 MG tablet TAKE 4 TABLETS (10 MG TOTAL) BY MOUTH ONCE A WEEK. CAUTION:CHEMOTHERAPY. PROTECT FROM LIGHT.   Polyethyl Glycol-Propyl Glycol (SYSTANE) 0.4-0.3 % SOLN Apply to eye daily.   predniSONE (DELTASONE) 5 MG tablet TAKE 1 TABLET BY MOUTH EVERY DAY WITH BREAKFAST    Immunization History  Administered Date(s) Administered   Influenza, High Dose Seasonal PF 01/28/2017   Moderna Sars-Covid-2 Vaccination 06/04/2019, 07/04/2019      Objective:     BP 118/70 (BP Location: Right Arm, Cuff Size: Normal)   Pulse 68   Temp 97.6 F (36.4 C)   Ht 5\' 11"  (1.803 m)   Wt 166 lb 3.2  oz (75.4 kg)   SpO2 100%   BMI 23.18 kg/m   SpO2: 100 % O2 Device: None (Room air)  GENERAL: Frail appearing gentleman, no acute distress.  Well-nourished well-developed, ambulates with assistance of a cane.  No respiratory distress, no conversational dyspnea. HEAD: Normocephalic, atraumatic.  EYES: Pupils equal, round, reactive to light.  No scleral icterus.  MOUTH: Nose/mouth/throat not examined due to masking requirements for COVID 19. NECK: Supple. No thyromegaly. Trachea midline. No JVD.  No adenopathy. PULMONARY: Good air entry bilaterally.  No adventitious sounds. CARDIOVASCULAR: S1 and S2.  Regular rate and rhythm.  No rubs, murmurs or gallops heard. ABDOMEN: Benign. MUSCULOSKELETAL: No joint deformity, no clubbing, no edema.  NEUROLOGIC: No focal deficit, no gait disturbance, speech is fluent. SKIN: Intact,warm,dry. PSYCH: Mood and behavior normal.   PET/CT was performed 11 January 2023 this has been independently reviewed however, not reviewed by radiologist yet.  Findings appear stable to mildly improved.  As noted, radiologist interpretation still pending.   Assessment & Plan:     ICD-10-CM   1. Sarcoidosis  D86.9 Angiotensin converting enzyme    CANCELED: Angiotensin converting enzyme   Continue methotrexate 10 mg weekly Decrease prednisone to 5 mg every other day Obtain ACE level    2. Chronic fatigue  R53.82 Thyroid Panel With TSH    CANCELED: Thyroid Panel With TSH   Check thyroid function Check CBC and CMET    3. Therapeutic drug monitoring  Z51.81 CBC w/Diff    Comprehensive metabolic panel    CANCELED: Comprehensive metabolic panel    CANCELED: CBC w/Diff   Patient did not stop by lab today Reordered CBC and CMET is on MTX therapy     Orders Placed This Encounter  Procedures   CBC w/Diff    Standing Status:   Future    Number of Occurrences:   1    Standing Expiration Date:   01/13/2024   Comprehensive metabolic panel    Standing Status:   Future    Number of Occurrences:   1    Standing Expiration Date:   01/13/2024   Thyroid Panel With TSH    Standing Status:   Future    Number of Occurrences:   1    Standing Expiration Date:   01/13/2024   Angiotensin converting enzyme    Standing Status:   Future    Number of Occurrences:   1    Standing Expiration Date:   01/13/2024    The patient will see [place] in follow-up in 3 months time call sooner should any new problems arise.   Gailen Shelter, MD Advanced Bronchoscopy P[facility]    *This note was dictated using voice recognition software/Dragon.  Despite best  efforts to proofread, errors can occur which can change the meaning. Any transcriptional errors that result from this process are unintentional and may not be fully corrected at the time of dictation.    Addendum   01/13/2023:  Recent Results (from the past 2160 hour(s))  Glucose, capillary     Status: None   Collection Time: 01/11/23 10:25 AM  Result Value Ref Range   Glucose-Capillary 71 70 - 99 mg/dL    Comment: Glucose reference range applies only to samples taken after fasting for at least 8 hours.  Angiotensin converting enzyme     Status: None   Collection Time: 01/13/23 11:10 AM  Result Value Ref Range   Angiotensin-Converting Enzyme 36 14 - 82 U/L  Comment: (NOTE) Performed At: [facility] [address] [place], [place] [id number] Jolene Schimke MD [id number]   Thyroid Panel With TSH     Status: None   Collection Time: 01/13/23 11:10 AM  Result Value Ref Range   TSH 2.000 0.450 - 4.500 uIU/mL   T4, Total 6.1 4.5 - 12.0 ug/dL   T3 Uptake Ratio 33 24 - 39 %   Free Thyroxine Index 2.0 1.2 - 4.9    Comment: (NOTE) Performed At: [facility] [address] [place], [place] [id number] Jolene Schimke MD [id number]   Comprehensive metabolic panel     Status: Abnormal   Collection Time: 01/13/23 11:10 AM  Result Value Ref Range   Sodium 140 135 - 145 mmol/L   Potassium 3.8 3.5 - 5.1 mmol/L   Chloride 103 98 - 111 mmol/L   CO2 30 22 - 32 mmol/L   Glucose, Bld 101 (H) 70 - 99 mg/dL    Comment: Glucose reference range applies only to samples taken after fasting for at least 8 hours.   BUN 11 8 - 23 mg/dL   Creatinine, Ser 1.75 0.61 - 1.24 mg/dL   Calcium 9.5 8.9 - 10.2 mg/dL   Total Protein 7.7 6.5 - 8.1 g/dL   Albumin 4.3 3.5 - 5.0 g/dL   AST 16 15 - 41 U/L   ALT 11 0 - 44 U/L   Alkaline Phosphatase 62 38 - 126 U/L   Total Bilirubin 1.3 (H) 0.3 - 1.2 mg/dL   GFR, Estimated >58 >52 mL/min    Comment: (NOTE) Calculated using the CKD-EPI  Creatinine Equation (2021)    Anion gap 7 5 - 15    Comment: Performed at [facility], [address] [place], [place] [address]  CBC with Differential/Platelet     Status: Abnormal   Collection Time: 01/13/23 11:10 AM  Result Value Ref Range   WBC 8.6 4.0 - 10.5 K/uL   RBC 3.60 (L) 4.22 - 5.81 MIL/uL   Hemoglobin 12.9 (L) 13.0 - 17.0 g/dL   HCT 23.5 (L) 36.1 - 44.3 %   MCV 106.1 (H) 80.0 - 100.0 fL   MCH 35.8 (H) 26.0 - 34.0 pg   MCHC 33.8 30.0 - 36.0 g/dL   RDW 15.4 00.8 - 67.6 %   Platelets 76 (L) 150 - 400 K/uL    Comment: Immature Platelet Fraction may be clinically indicated, consider ordering this additional test [id number] PLATELET COUNT CONFIRMED BY SMEAR    nRBC 0.0 0.0 - 0.2 %   Neutrophils Relative % 77 %   Neutro Abs 6.6 1.7 - 7.7 K/uL   Lymphocytes Relative 17 %   Lymphs Abs 1.5 0.7 - 4.0 K/uL   Monocytes Relative 4 %   Monocytes Absolute 0.3 0.1 - 1.0 K/uL   Eosinophils Relative 0 %   Eosinophils Absolute 0.0 0.0 - 0.5 K/uL   Basophils Relative 0 %   Basophils Absolute 0.0 0.0 - 0.1 K/uL   WBC Morphology MORPHOLOGY UNREMARKABLE    RBC Morphology MORPHOLOGY UNREMARKABLE    Smear Review Normal platelet morphology    Immature Granulocytes 2 %   Abs Immature Granulocytes 0.15 (H) 0.00 - 0.07 K/uL    Comment: Performed at [facility], [address] [place], [place] [address]   Patient is exhibiting thrombocytopenia, he was advised to hold methotrexate until further instruction.  Will repeat CBC with differential and CMET in 2 weeks.  Patient was notified.  Gailen Shelter, MD  Advanced Bronchoscopy PCCM  [facility]

## 2023-01-13 NOTE — Patient Instructions (Addendum)
I will call you when I have the official results of the PET/CT.  For now, lets decrease the prednisone to 5 mg every other day.  hold your methotrexate  Stop by the lab to get your blood drawn for some lab work today.    We will see you in follow-up in 3 months time call sooner should any new problems arise.  **Please note this is a revised AVS at patient request.

## 2023-01-14 ENCOUNTER — Telehealth: Payer: Self-pay | Admitting: Pulmonary Disease

## 2023-01-14 LAB — ANGIOTENSIN CONVERTING ENZYME: Angiotensin-Converting Enzyme: 36 U/L (ref 14–82)

## 2023-01-14 NOTE — Telephone Encounter (Signed)
Please refer to CBC lab result from 01/13/2023.  Patient is aware to hold Methotrexate. He stated that his AVS stated to continue it. I explained that was written prior to receiving lab results. He would like AVS updated and to state to hold methotrexate.  AVS updated. Patient stated that he would come by the office to pick it up. Nothing further needed.

## 2023-01-14 NOTE — Telephone Encounter (Signed)
Patient would like to know if he needs to take the Methotrexate. Patient phone number is [phone].

## 2023-01-15 LAB — THYROID PANEL WITH TSH
Free Thyroxine Index: 2 (ref 1.2–4.9)
T3 Uptake Ratio: 33 % (ref 24–39)
T4, Total: 6.1 ug/dL (ref 4.5–12.0)
TSH: 2 u[IU]/mL (ref 0.450–4.500)

## 2023-01-20 DIAGNOSIS — Z1331 Encounter for screening for depression: Secondary | ICD-10-CM | POA: Diagnosis not present

## 2023-01-20 DIAGNOSIS — I700 Atherosclerosis of aorta: Secondary | ICD-10-CM | POA: Diagnosis not present

## 2023-01-20 DIAGNOSIS — R634 Abnormal weight loss: Secondary | ICD-10-CM | POA: Diagnosis not present

## 2023-01-20 DIAGNOSIS — Z79899 Other long term (current) drug therapy: Secondary | ICD-10-CM | POA: Diagnosis not present

## 2023-01-20 DIAGNOSIS — N400 Enlarged prostate without lower urinary tract symptoms: Secondary | ICD-10-CM | POA: Diagnosis not present

## 2023-01-20 DIAGNOSIS — Z1211 Encounter for screening for malignant neoplasm of colon: Secondary | ICD-10-CM | POA: Diagnosis not present

## 2023-01-20 DIAGNOSIS — Z1159 Encounter for screening for other viral diseases: Secondary | ICD-10-CM | POA: Diagnosis not present

## 2023-01-20 DIAGNOSIS — Z8673 Personal history of transient ischemic attack (TIA), and cerebral infarction without residual deficits: Secondary | ICD-10-CM | POA: Diagnosis not present

## 2023-01-20 DIAGNOSIS — Z0000 Encounter for general adult medical examination without abnormal findings: Secondary | ICD-10-CM | POA: Diagnosis not present

## 2023-01-20 DIAGNOSIS — D862 Sarcoidosis of lung with sarcoidosis of lymph nodes: Secondary | ICD-10-CM | POA: Diagnosis not present

## 2023-01-24 ENCOUNTER — Telehealth: Payer: Self-pay | Admitting: Pulmonary Disease

## 2023-01-24 NOTE — Telephone Encounter (Signed)
Patient would also like to know if he still needs to get blood work completed on the 3rd of October since he was taken off some of his medications. Please call and advise.

## 2023-01-24 NOTE — Telephone Encounter (Signed)
Patient is wanting to know the results of his CT Scan from last week.

## 2023-01-24 NOTE — Telephone Encounter (Signed)
The PET scan has not been read yet.  I did discuss with him and his brother preliminary findings but the formal report has not been submitted.  These are taking longer to read because the radiologists are short staffed.  He does need to get the blood work done ordered for 3 October.  This is important because I need to see if by stopping the methotrexate his blood work has improved.

## 2023-01-24 NOTE — Telephone Encounter (Signed)
PET scan was 9/17.

## 2023-01-25 NOTE — Telephone Encounter (Signed)
PET/CT has finally been read.  There is no significant change from prior.  The bone involvement of the sarcoid is about the same or a little bit more pronounced.  Work ordered to make sure that his numbers are getting back to normal off of the medication.  He may need alternative management we can discuss on follow-up.

## 2023-01-25 NOTE — Telephone Encounter (Signed)
Lm x1 for the patient.

## 2023-01-26 ENCOUNTER — Encounter: Payer: Self-pay | Admitting: Pulmonary Disease

## 2023-01-26 NOTE — Telephone Encounter (Signed)
Patient is calling back. Results have been posted to MyChart and he would like clarification. If he does not answer you can leave a message on his MyChart.

## 2023-01-26 NOTE — Telephone Encounter (Signed)
See telephone encounter from 01/24/2023.  I have spoke with the patient.  Nothing further needed.

## 2023-01-26 NOTE — Telephone Encounter (Signed)
I have notified the patient. He will come in for his labs work tomorrow, that was previously scheduled.   Nothing further needed.

## 2023-01-27 ENCOUNTER — Other Ambulatory Visit
Admission: RE | Admit: 2023-01-27 | Discharge: 2023-01-27 | Disposition: A | Discharge status: Home / Self Care | Payer: Medicare PPO | Attending: Pulmonary Disease | Admitting: Pulmonary Disease

## 2023-01-27 DIAGNOSIS — Z5181 Encounter for therapeutic drug level monitoring: Secondary | ICD-10-CM

## 2023-01-27 LAB — CBC WITH DIFFERENTIAL/PLATELET
Abs Immature Granulocytes: 0.24 10*3/uL — ABNORMAL HIGH (ref 0.00–0.07)
Basophils Absolute: 0 10*3/uL (ref 0.0–0.1)
Basophils Relative: 0 %
Eosinophils Absolute: 0 10*3/uL (ref 0.0–0.5)
Eosinophils Relative: 0 %
HCT: 37.7 % — ABNORMAL LOW (ref 39.0–52.0)
Hemoglobin: 13.1 g/dL (ref 13.0–17.0)
Immature Granulocytes: 2 %
Lymphocytes Relative: 15 %
Lymphs Abs: 1.5 10*3/uL (ref 0.7–4.0)
MCH: 35.5 pg — ABNORMAL HIGH (ref 26.0–34.0)
MCHC: 34.7 g/dL (ref 30.0–36.0)
MCV: 102.2 fL — ABNORMAL HIGH (ref 80.0–100.0)
Monocytes Absolute: 0.3 10*3/uL (ref 0.1–1.0)
Monocytes Relative: 3 %
Neutro Abs: 8.2 10*3/uL — ABNORMAL HIGH (ref 1.7–7.7)
Neutrophils Relative %: 80 %
Platelets: 70 10*3/uL — ABNORMAL LOW (ref 150–400)
RBC: 3.69 MIL/uL — ABNORMAL LOW (ref 4.22–5.81)
RDW: 13.3 % (ref 11.5–15.5)
Smear Review: NORMAL
WBC: 10.3 10*3/uL (ref 4.0–10.5)
nRBC: 0 % (ref 0.0–0.2)

## 2023-01-27 LAB — COMPREHENSIVE METABOLIC PANEL
ALT: 12 U/L (ref 0–44)
AST: 18 U/L (ref 15–41)
Albumin: 4.7 g/dL (ref 3.5–5.0)
Alkaline Phosphatase: 64 U/L (ref 38–126)
Anion gap: 12 (ref 5–15)
BUN: 8 mg/dL (ref 8–23)
CO2: 26 mmol/L (ref 22–32)
Calcium: 9.6 mg/dL (ref 8.9–10.3)
Chloride: 100 mmol/L (ref 98–111)
Creatinine, Ser: 0.73 mg/dL (ref 0.61–1.24)
GFR, Estimated: >60 mL/min (ref >60)
Glucose, Bld: 100 mg/dL — ABNORMAL HIGH (ref 70–99)
Potassium: 4.4 mmol/L (ref 3.5–5.1)
Sodium: 138 mmol/L (ref 135–145)
Total Bilirubin: 1.5 mg/dL — ABNORMAL HIGH (ref 0.3–1.2)
Total Protein: 8 g/dL (ref 6.5–8.1)

## 2023-01-27 LAB — FOLATE: Folate: 17.1 ng/mL (ref >5.9)

## 2023-01-27 LAB — VITAMIN B12: Vitamin B-12: 1322 pg/mL — ABNORMAL HIGH (ref 180–914)

## 2023-01-27 NOTE — Addendum Note (Signed)
Addended by: Lonell Face C on: 01/27/2023 12:44 PM   Modules accepted: Orders

## 2023-03-05 ENCOUNTER — Other Ambulatory Visit: Payer: Self-pay | Admitting: Pulmonary Disease

## 2023-04-18 ENCOUNTER — Other Ambulatory Visit: Payer: Self-pay

## 2023-04-18 ENCOUNTER — Telehealth: Payer: Self-pay | Admitting: Pulmonary Disease

## 2023-04-18 DIAGNOSIS — Z5181 Encounter for therapeutic drug level monitoring: Secondary | ICD-10-CM

## 2023-04-18 NOTE — Telephone Encounter (Signed)
According to CBC results from 01/27/2023- repeat CBC w/diff was needed in 3/4wk. CBC has been ordered.  Lm for patient.

## 2023-04-18 NOTE — Telephone Encounter (Signed)
PT has appt w/Dr. Jayme Cloud 12/26. Usually they want him to have blood work before his appts. Did they want the same this time as well. Please call brother @ [phone]

## 2023-04-18 NOTE — Telephone Encounter (Signed)
Patient advised by Efraim Kaufmann that lab order was placed. Patient reports he will come in 30-45 minutes before his appt time. Nothing further needed.

## 2023-04-21 ENCOUNTER — Encounter: Payer: Self-pay | Admitting: Pulmonary Disease

## 2023-04-21 ENCOUNTER — Other Ambulatory Visit
Admission: RE | Admit: 2023-04-21 | Discharge: 2023-04-21 | Disposition: A | Discharge status: Home / Self Care | Payer: Medicare PPO | Attending: Pulmonary Disease | Admitting: Pulmonary Disease

## 2023-04-21 ENCOUNTER — Ambulatory Visit: Payer: Medicare PPO | Admitting: Pulmonary Disease

## 2023-04-21 ENCOUNTER — Telehealth: Payer: Self-pay | Admitting: Pulmonary Disease

## 2023-04-21 VITALS — BP 126/76 | HR 66 | Temp 98.1°F | Ht 71.0 in | Wt 168.0 lb

## 2023-04-21 DIAGNOSIS — D869 Sarcoidosis, unspecified: Secondary | ICD-10-CM

## 2023-04-21 DIAGNOSIS — R590 Localized enlarged lymph nodes: Secondary | ICD-10-CM

## 2023-04-21 DIAGNOSIS — D696 Thrombocytopenia, unspecified: Secondary | ICD-10-CM | POA: Insufficient documentation

## 2023-04-21 LAB — CBC WITH DIFFERENTIAL/PLATELET
Abs Immature Granulocytes: 0.31 10*3/uL — ABNORMAL HIGH (ref 0.00–0.07)
Basophils Absolute: 0 10*3/uL (ref 0.0–0.1)
Basophils Relative: 0 %
Eosinophils Absolute: 0 10*3/uL (ref 0.0–0.5)
Eosinophils Relative: 0 %
HCT: 35 % — ABNORMAL LOW (ref 39.0–52.0)
Hemoglobin: 11.8 g/dL — ABNORMAL LOW (ref 13.0–17.0)
Immature Granulocytes: 3 %
Lymphocytes Relative: 24 %
Lymphs Abs: 2.7 10*3/uL (ref 0.7–4.0)
MCH: 36.1 pg — ABNORMAL HIGH (ref 26.0–34.0)
MCHC: 33.7 g/dL (ref 30.0–36.0)
MCV: 107 fL — ABNORMAL HIGH (ref 80.0–100.0)
Monocytes Absolute: 0.3 10*3/uL (ref 0.1–1.0)
Monocytes Relative: 3 %
Neutro Abs: 7.9 10*3/uL — ABNORMAL HIGH (ref 1.7–7.7)
Neutrophils Relative %: 70 %
Platelets: 25 10*3/uL — CL (ref 150–400)
RBC: 3.27 MIL/uL — ABNORMAL LOW (ref 4.22–5.81)
RDW: 14.1 % (ref 11.5–15.5)
Smear Review: DECREASED
WBC: 11.2 10*3/uL — ABNORMAL HIGH (ref 4.0–10.5)
nRBC: 0 % (ref 0.0–0.2)

## 2023-04-21 MED ORDER — DEXAMETHASONE 4 MG PO TABS: 40.0000 mg | ORAL_TABLET | Freq: Every day | ORAL | 0 refills | Status: AC

## 2023-04-21 NOTE — Telephone Encounter (Signed)
See progress note from today

## 2023-04-21 NOTE — Patient Instructions (Signed)
VISIT SUMMARY:  During today's visit, we discussed your ongoing management of sarcoidosis involving the bone. You reported feeling generally well and did not experience any significant hip pain or new symptoms. We also addressed your concerns about weight gain and the bags under your eyes. You have decided to wait before getting a flu shot. We have scheduled a follow-up visit in three months.  YOUR PLAN:  -SARCOIDOSIS: Sarcoidosis is a condition where clusters of inflammatory cells form in various organs, including the bones. Your condition appears well-managed as you reported no significant hip pain or new symptoms. We will continue your current prednisone regimen and monitor your blood counts. If your blood counts do not improve, we may consider folate supplementation. If your symptoms worsen, we might discuss the potential use of Enbrel, which would require a referral to a specialist. Please continue to monitor your symptoms and report any changes.  -GENERAL HEALTH MAINTENANCE: You have chosen to wait before getting a flu shot. We will offer it again at your next visit. Maintaining your overall health is important, so please continue with your current lifestyle and health practices.  INSTRUCTIONS:  Please schedule a follow-up appointment in three months and complete the necessary blood work before your next visit. We will call you with the results of your current blood counts.

## 2023-04-21 NOTE — Telephone Encounter (Signed)
Critical CBC platelet count of 25

## 2023-04-21 NOTE — Progress Notes (Signed)
Subjective:    Patient ID: Colin Barron, male    DOB: [birth year], 77 y.o.   MRN: [id number]  Patient Care Team: Tally Joe, MD as PCP - General (Family Medicine) Salena Saner, MD as Consulting Physician (Pulmonary Disease)  Chief Complaint  Patient presents with   Follow-up    No cough, shortness of breath or wheezing.     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 Initially evaluated because of mediastinal adenopathy.  Recall that previously bone involvement of sarcoidosis was noted on the left ilium noted by PET/CT and MRI (04/2021).  He had biopsy of this area (05/2021) with findings of the nonnecrotizing granulomas consistent with sarcoid granulomas with AFB and fungal stains negative.  Initially he had left hip pain but this has now resolved after initiation of therapy with methotrexate, and continues to be quiescent. Patient was started on weekly methotrexate and daily prednisone in March 2023, patient was hesitant about prednisone.  Patient was last seen on January 13, 2023 at that time platelet count was starting to decrease and methotrexate has been discontinued since then.  He remained on prednisone 5 mg every other day with a plan to discontinuing it in the near future at patient's preference.  Presents today for follow-up.  HPI Discussed the use of AI scribe software for clinical note transcription with the patient, who gave verbal consent to proceed.  History of Present Illness   The patient, with a known diagnosis of sarcoidosis involving the bone, has been off of methotrexate and reports feeling "pretty good" overall. He denies any specific pain in the hip, where he previously experienced discomfort. The patient has been on prednisone, 5 mg every other day.  He has been anxious to get off of prednisone.  Does not endorse any fevers, chills or sweats.   No cough or sputum production.  No bleeding issues.  No shortness of breath.  He has generalized weakness which is chronic.  He appears chronically ill which is not new.  He has had no orthopnea, no paroxysmal nocturnal dyspnea, no lower extremity edema.  No chest pain.  He minimizes his symptoms.  Brother presents with him today.  Notes that his appetite is good.   Review of Systems A 10 point review of systems was performed and it is as noted above otherwise negative.   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 Swallow whole.   b complex vitamins capsule Take 1 capsule by mouth daily.   Cholecalciferol (VITAMIN D3 PO) Take 1 tablet by mouth every other day.   dexamethasone (DECADRON) 4 MG tablet Take 10 tablets (40 mg total) by mouth daily  for 4 days.   Polyethyl Glycol-Propyl Glycol (SYSTANE) 0.4-0.3 % SOLN Apply to eye daily.   [DISCONTINUED] predniSONE (DELTASONE) 5 MG tablet TAKE 1 TABLET BY MOUTH EVERY DAY WITH BREAKFAST    Immunization History  Administered Date(s) Administered   Influenza, High Dose Seasonal PF 01/28/2017   Moderna Sars-Covid-2 Vaccination 06/04/2019, 07/04/2019        Objective:     BP 126/76 (BP Location: Left Arm, Patient Position: Sitting, Cuff Size:  Normal)   Pulse 66   Temp 98.1 F (36.7 C) (Temporal)   Ht 5\' 11"  (1.803 m)   Wt 168 lb (76.2 kg)   SpO2 100%   BMI 23.43 kg/m   SpO2: 100 %  GENERAL: Frail, chronically ill appearing gentleman, no acute distress.  Well-nourished well-developed, ambulates with assistance of a cane.  No respiratory distress, no conversational dyspnea. HEAD: Normocephalic, atraumatic.  EYES: Pupils equal, round, reactive to light.  No scleral icterus.  MOUTH: Nose/mouth/throat not examined due to masking requirements for COVID 19. NECK: Supple. No thyromegaly. Trachea midline. No JVD.  No adenopathy. PULMONARY: Good air entry bilaterally.  No adventitious sounds. CARDIOVASCULAR: S1 and S2. Regular rate and rhythm.  No rubs, murmurs or gallops heard. ABDOMEN: Benign. MUSCULOSKELETAL: No joint deformity, no clubbing, no edema.  NEUROLOGIC: No focal deficit, no gait disturbance, speech is fluent. SKIN: Intact,warm,dry. PSYCH: Mood and behavior normal.   Recent Results (from the past 2160 hours)  Comp Met (CMET)     Status: Abnormal   Collection Time: 01/27/23 12:58 PM  Result Value Ref Range   Sodium 138 135 - 145 mmol/L   Potassium 4.4 3.5 - 5.1 mmol/L   Chloride 100 98 - 111 mmol/L   CO2 26 22 - 32 mmol/L   Glucose, Bld 100 (H) 70 - 99 mg/dL    Comment: Glucose reference range applies only to samples taken after fasting for at least 8 hours.   BUN 8 8 - 23 mg/dL   Creatinine, Ser 9.56 0.61 - 1.24 mg/dL   Calcium 9.6 8.9 - 21.3 mg/dL   Total Protein 8.0 6.5 - 8.1 g/dL   Albumin 4.7 3.5 - 5.0 g/dL   AST 18 15 - 41 U/L   ALT 12 0 - 44 U/L   Alkaline Phosphatase 64 38 - 126 U/L   Total Bilirubin 1.5 (H) 0.3 - 1.2 mg/dL   GFR, Estimated >08 >65 mL/min    Comment: (NOTE) Calculated using the CKD-EPI Creatinine Equation (2021)    Anion gap 12 5 - 15    Comment: Performed at [facility], [address] [place], [place] [address]  B12     Status: Abnormal   Collection Time: 01/27/23  12:58 PM  Result Value Ref Range   Vitamin B-12 1,322 (H) 180 - 914 pg/mL    Comment: (NOTE) This assay is not validated for testing neonatal or myeloproliferative syndrome specimens for Vitamin B12 levels. Performed at [facility], [address] [place], [place] [address]   Folate     Status: None   Collection Time: 01/27/23 12:58 PM  Result Value Ref Range   Folate 17.1 >5.9 ng/mL    Comment: Performed at [facility], [address] [place], [place] [address]  CBC w/Diff     Status: Abnormal   Collection Time: 01/27/23 12:58 PM  Result Value Ref Range   WBC 10.3 4.0 - 10.5 K/uL   RBC 3.69 (L) 4.22 - 5.81 MIL/uL   Hemoglobin  13.1 13.0 - 17.0 g/dL   HCT 16.1 (L) 09.6 - 04.5 %   MCV 102.2 (H) 80.0 - 100.0 fL   MCH 35.5 (H) 26.0 - 34.0 pg   MCHC 34.7 30.0 - 36.0 g/dL   RDW 40.9 81.1 - 91.4 %   Platelets 70 (L) 150 - 400 K/uL    Comment: Immature Platelet Fraction may be clinically indicated, consider ordering this additional test [id number] PLATELET COUNT CONFIRMED BY SMEAR    nRBC 0.0 0.0 - 0.2 %   Neutrophils Relative % 80 %   Neutro Abs 8.2 (H) 1.7 - 7.7 K/uL   Lymphocytes Relative 15 %   Lymphs Abs 1.5 0.7 - 4.0 K/uL   Monocytes Relative 3 %   Monocytes Absolute 0.3 0.1 - 1.0 K/uL   Eosinophils Relative 0 %   Eosinophils Absolute 0.0 0.0 - 0.5 K/uL   Basophils Relative 0 %   Basophils Absolute 0.0 0.0 - 0.1 K/uL   WBC Morphology MORPHOLOGY UNREMARKABLE    RBC Morphology MORPHOLOGY UNREMARKABLE    Smear Review Normal platelet morphology    Immature Granulocytes 2 %   Abs Immature Granulocytes 0.24 (H) 0.00 - 0.07 K/uL    Comment: Performed at [facility], [address] [place], [place] [address]  CBC with Differential/Platelet     Status: Abnormal   Collection Time: 04/21/23 10:54 AM  Result Value Ref Range   WBC 11.2 (H) 4.0 - 10.5 K/uL   RBC 3.27 (L) 4.22 - 5.81 MIL/uL   Hemoglobin 11.8 (L) 13.0 - 17.0 g/dL   HCT 78.4 (L) 69.6  - 52.0 %   MCV 107.0 (H) 80.0 - 100.0 fL   MCH 36.1 (H) 26.0 - 34.0 pg   MCHC 33.7 30.0 - 36.0 g/dL   RDW 29.5 28.4 - 13.2 %   Platelets 25 (LL) 150 - 400 K/uL    Comment: Immature Platelet Fraction may be clinically indicated, consider ordering this additional test [id number] PLATELET COUNT CONFIRMED BY SMEAR THIS CRITICAL RESULT HAS VERIFIED AND BEEN CALLED TO MELISSA MCKENZIE, RN BY STEPHANIE HONEYCUTT ON 12 26 2024 AT 1206, AND HAS BEEN READ BACK.     nRBC 0.0 0.0 - 0.2 %   Neutrophils Relative % 70 %   Neutro Abs 7.9 (H) 1.7 - 7.7 K/uL   Lymphocytes Relative 24 %   Lymphs Abs 2.7 0.7 - 4.0 K/uL   Monocytes Relative 3 %   Monocytes Absolute 0.3 0.1 - 1.0 K/uL   Eosinophils Relative 0 %   Eosinophils Absolute 0.0 0.0 - 0.5 K/uL   Basophils Relative 0 %   Basophils Absolute 0.0 0.0 - 0.1 K/uL   Smear Review PLATELETS APPEAR DECREASED     Comment: PLATELET COUNT CONFIRMED BY SMEAR   Immature Granulocytes 3 %   Abs Immature Granulocytes 0.31 (H) 0.00 - 0.07 K/uL   Tear Drop Cells PRESENT    Polychromasia PRESENT     Comment: Performed at [facility], [address] [place], [place] [address]        Assessment & Plan:     ICD-10-CM   1. Sarcoidosis  D86.9     2. Mediastinal adenopathy  R59.0 Histoplasma [place] Ag Ser    Histoplasma Antigen, Urine    3. Thrombocytopenia (HCC)  D69.6 Ambulatory referral to Hematology / Oncology      Orders Placed This Encounter  Procedures   Histoplasma Gal[place]annan Ag Ser    Standing Status:   Future  Expiration Date:   04/20/2024   Histoplasma Antigen, Urine    Standing Status:   Future    Expiration Date:   04/20/2024   Ambulatory referral to Hematology / Oncology    Referral Priority:   Urgent    Referral Type:   Consultation    Referral Reason:   Specialty Services Required    Requested Specialty:   Oncology    Number of Visits Requested:   1    Meds ordered this encounter  Medications   dexamethasone  (DECADRON) 4 MG tablet    Sig: Take 10 tablets (40 mg total) by mouth daily for 4 days.    Dispense:  40 tablet    Refill:  0   Discussion:    Sarcoidosis Sarcoidosis with bone involvement. Reports no significant hip pain or new symptoms such as dyspnea or chest pain.  Condition is well-managed, focusing on symptom monitoring due to potential medication side effects. Explained potential use of Enbrel for worsening symptoms, requiring specialist referral. Current symptoms do not warrant this treatment. - Continue current prednisone regimen, patient anxious to be weaned off - Monitor blood counts and call with results - Discuss potential use of Enbrel if symptoms worsen - Follow-up in three months with blood work prior to visit  General Health Maintenance Declined flu shot, preferring to monitor current health status. - Offer flu shot at next visit  Follow-up - Schedule follow-up appointment in three months - Direct on necessary blood work before next visit.      Advised if symptoms do not improve or worsen, to please contact office for sooner follow up or seek emergency care.    I spent 40 minutes of dedicated to the care of this patient on the date of this encounter to include pre-visit review of records, face-to-face time with the patient discussing conditions above, post visit ordering of testing, clinical documentation with the electronic health record, making appropriate referrals as documented, and communicating necessary findings to members of the patients care team.     C. Danice Goltz, MD Advanced Bronchoscopy PCCM [facility]    *This note was generated using voice recognition software/Dragon and/or AI transcription program.  Despite best efforts to proofread, errors can occur which can change the meaning. Any transcriptional errors that result from this process are unintentional and may not be fully corrected at the time of dictation.    ADDENDUM:  04/21/2023, 12:45 PM Received call from lab of critical platelet count of 25K.  Patient not having any bleeding symptoms.  Discussed with hematology on-call, Dr. 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  Dr. Alena Bills recommended starting Decadron 40 mg daily x 4 days.  Prescription was sent to the patient's pharmacy.  Patient also received instructions via telephone call.  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  Though previous biopsies did not show fungal elements, we will proceed to obtain Histoplasma urinary antigen and blood antigen.  Histoplasma can invade bone marrow.  Sarcoidosis and Histoplasma can coincide.  Orders placed as above.  An additional 25 minutes of non-face-to-face care coordination for this patient were provided.  Gailen Shelter, MD Advanced Bronchoscopy PCCM [facility]

## 2023-04-22 ENCOUNTER — Inpatient Hospital Stay: Payer: Medicare PPO | Attending: Internal Medicine | Admitting: Internal Medicine

## 2023-04-22 ENCOUNTER — Encounter: Payer: Self-pay | Admitting: Internal Medicine

## 2023-04-22 ENCOUNTER — Other Ambulatory Visit: Payer: Self-pay | Admitting: *Deleted

## 2023-04-22 ENCOUNTER — Inpatient Hospital Stay: Payer: Medicare PPO

## 2023-04-22 VITALS — BP 145/71 | HR 65 | Temp 97.7°F | Resp 18 | Wt 168.0 lb

## 2023-04-22 DIAGNOSIS — D696 Thrombocytopenia, unspecified: Secondary | ICD-10-CM | POA: Diagnosis not present

## 2023-04-22 DIAGNOSIS — D869 Sarcoidosis, unspecified: Secondary | ICD-10-CM | POA: Insufficient documentation

## 2023-04-22 DIAGNOSIS — D539 Nutritional anemia, unspecified: Secondary | ICD-10-CM | POA: Diagnosis not present

## 2023-04-22 DIAGNOSIS — Z7952 Long term (current) use of systemic steroids: Secondary | ICD-10-CM | POA: Diagnosis not present

## 2023-04-22 DIAGNOSIS — D4622 Refractory anemia with excess of blasts 2: Secondary | ICD-10-CM | POA: Diagnosis not present

## 2023-04-22 LAB — COMPREHENSIVE METABOLIC PANEL
ALT: 14 U/L (ref 0–44)
AST: 17 U/L (ref 15–41)
Albumin: 4.5 g/dL (ref 3.5–5.0)
Alkaline Phosphatase: 66 U/L (ref 38–126)
Anion gap: 9 (ref 5–15)
BUN: 11 mg/dL (ref 8–23)
CO2: 25 mmol/L (ref 22–32)
Calcium: 9.3 mg/dL (ref 8.9–10.3)
Chloride: 103 mmol/L (ref 98–111)
Creatinine, Ser: 0.68 mg/dL (ref 0.61–1.24)
GFR, Estimated: >60 mL/min (ref >60)
Glucose, Bld: 99 mg/dL (ref 70–99)
Potassium: 3.8 mmol/L (ref 3.5–5.1)
Sodium: 137 mmol/L (ref 135–145)
Total Bilirubin: 1.2 mg/dL — ABNORMAL HIGH (ref <1.2)
Total Protein: 8 g/dL (ref 6.5–8.1)

## 2023-04-22 LAB — FOLATE: Folate: 19.1 ng/mL (ref >5.9)

## 2023-04-22 LAB — CBC WITH DIFFERENTIAL (CANCER CENTER ONLY)
Abs Immature Granulocytes: 0 10*3/uL (ref 0.00–0.07)
Basophils Absolute: 0 10*3/uL (ref 0.0–0.1)
Basophils Relative: 0 %
Blasts: 2 %
Eosinophils Absolute: 0 10*3/uL (ref 0.0–0.5)
Eosinophils Relative: 0 %
HCT: 34.2 % — ABNORMAL LOW (ref 39.0–52.0)
Hemoglobin: 11.4 g/dL — ABNORMAL LOW (ref 13.0–17.0)
Lymphocytes Relative: 24 %
Lymphs Abs: 2.6 10*3/uL (ref 0.7–4.0)
MCH: 35.4 pg — ABNORMAL HIGH (ref 26.0–34.0)
MCHC: 33.3 g/dL (ref 30.0–36.0)
MCV: 106.2 fL — ABNORMAL HIGH (ref 80.0–100.0)
Monocytes Absolute: 0 10*3/uL — ABNORMAL LOW (ref 0.1–1.0)
Monocytes Relative: 0 %
Neutro Abs: 8 10*3/uL — ABNORMAL HIGH (ref 1.7–7.7)
Neutrophils Relative %: 74 %
Platelet Count: 26 10*3/uL — ABNORMAL LOW (ref 150–400)
RBC: 3.22 MIL/uL — ABNORMAL LOW (ref 4.22–5.81)
RDW: 14.2 % (ref 11.5–15.5)
Smear Review: NORMAL
WBC Count: 10.8 10*3/uL — ABNORMAL HIGH (ref 4.0–10.5)
WBC Morphology: 2
nRBC: 0 % (ref 0.0–0.2)

## 2023-04-22 LAB — ABO/RH: ABO/RH(D): O POS

## 2023-04-22 LAB — HIV ANTIBODY (ROUTINE TESTING W REFLEX): HIV Screen 4th Generation wRfx: NONREACTIVE

## 2023-04-22 LAB — HEPATITIS C ANTIBODY: HCV Ab: NONREACTIVE

## 2023-04-22 LAB — FIBRINOGEN: Fibrinogen: 240 mg/dL (ref 210–475)

## 2023-04-22 LAB — IMMATURE PLATELET FRACTION: Immature Platelet Fraction: 5.7 % (ref 1.2–8.6)

## 2023-04-22 LAB — LACTATE DEHYDROGENASE: LDH: 114 U/L (ref 98–192)

## 2023-04-22 LAB — HEPATITIS B CORE ANTIBODY, TOTAL: Hep B Core Total Ab: NONREACTIVE

## 2023-04-22 LAB — VITAMIN B12: Vitamin B-12: 960 pg/mL — ABNORMAL HIGH (ref 180–914)

## 2023-04-22 LAB — HEPATITIS B SURFACE ANTIGEN: Hepatitis B Surface Ag: NONREACTIVE

## 2023-04-22 LAB — PATHOLOGIST SMEAR REVIEW

## 2023-04-22 NOTE — Progress Notes (Signed)
[facility]  Telephone:([phone]) [phone] Fax:(336) [phone]  ID: AUSITN MANY OB: 07-18-1945  MR#: [id number]  ION#:[id number]  Patient Care Team: Tally Joe, MD as PCP - General (Family Medicine) Salena Saner, MD as Consulting Physician (Pulmonary Disease) Michaelyn Barter, MD as Consulting Physician (Oncology)  REFERRING PROVIDER: Dr. Jayme Cloud  REASON FOR REFERRAL: thrombocytopenia  HPI: Colin Barron is a 77 y.o. 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  Sarcoidosis-patient follows with Dr. Jayme Cloud. Initially evaluated for mediastinal adenopathy. s/p left iliac bone biopsy showed nonnecrotizing granulomas consistent with sarcoid granulomas.  He was started on methotrexate and daily prednisone in March 2023.  In September 2024, platelets started decreasing from 140-70.  Methotrexate was discontinued.  He was maintained on prednisone 5 mg every other day.  He went for routine checkup with Dr. Jayme Cloud on 04/21/2023.  CBC showed WBC 11.2, hemoglobin 11.8, MCV 107 and platelets 25.  Patient denies any easy bruising, bleeding.  Denies any worsening shortness of breath, chest pain, dizziness, headaches, urinary symptoms, bowel issues.    REVIEW OF SYSTEMS:   ROS  As per HPI. Otherwise, a complete review of systems is negative.  PAST MEDICAL HISTORY: Past Medical History:  Diagnosis Date   Arthritis    BPH (benign prostatic hyperplasia) 12/08/2016    PAST SURGICAL HISTORY: Past Surgical History:  Procedure Laterality Date   BIOPSY PROSTATE     CHOLECYSTECTOMY     CYST REMOVAL NECK     TONSILLECTOMY      FAMILY HISTORY: Family History  Problem Relation Age of Onset   Diabetes Mother    Hypertension Mother    Intracerebral hemorrhage Brother    Hypertension Brother    Stroke Maternal Grandmother    Stroke Maternal Grandfather    Hypertension Brother        POSS     HEALTH MAINTENANCE: Social History   Tobacco Use   Smoking status: Never   Smokeless tobacco: Never  Vaping Use   Vaping status: Never Used  Substance Use Topics   Alcohol use: No   Drug use: No     Allergies  Allergen Reactions   Amoxicillin Other (See Comments)    Tolerated Rocephin IV on 04/25/2021      Current Outpatient Medications  Medication Sig Dispense Refill   aspirin EC 81 MG EC tablet Take 1 tablet (81 mg total) by mouth daily. Swallow whole. 30 tablet 0   b complex vitamins capsule Take 1 capsule by mouth daily.     Cholecalciferol (VITAMIN D3 PO) Take 1 tablet by mouth every other day.     Polyethyl Glycol-Propyl Glycol (SYSTANE) 0.4-0.3 % SOLN Apply to eye daily.     dexamethasone (DECADRON) 4 MG tablet Take 10 tablets (40 mg total) by mouth daily for 4 days. (Patient not taking: Reported on 04/22/2023) 40 tablet 0   No current facility-administered medications for this visit.    OBJECTIVE: Vitals:   04/22/23 1324  BP: (!) 145/71  Pulse: 65  Resp: 18  Temp: 97.7 F (36.5 C)  SpO2: 100%     Body mass index is 23.43 kg/m.      General: Well-developed, well-nourished, no acute distress. Eyes: Pink conjunctiva, anicteric sclera. HEENT: Normocephalic, moist mucous membranes, clear oropharnyx. Lungs: Clear to auscultation bilaterally. Heart: Regular rate and rhythm. No rubs, murmurs, or gallops. Abdomen: Soft, nontender, nondistended. No organomegaly noted, normoactive bowel sounds. Musculoskeletal: No edema, cyanosis, or clubbing. Neuro:  Alert, answering all questions appropriately. Cranial nerves grossly intact. Skin: No rashes or petechiae noted. Psych: Normal affect. Lymphatics: No cervical, calvicular, axillary or inguinal LAD.   LAB RESULTS:  Lab Results  Component Value Date   NA 137 04/22/2023   K 3.8 04/22/2023   CL 103 04/22/2023   CO2 25 04/22/2023   GLUCOSE 99 04/22/2023   BUN 11 04/22/2023   CREATININE 0.68 04/22/2023    CALCIUM 9.3 04/22/2023   PROT 8.0 04/22/2023   ALBUMIN 4.5 04/22/2023   AST 17 04/22/2023   ALT 14 04/22/2023   ALKPHOS 66 04/22/2023   BILITOT 1.2 (H) 04/22/2023   GFRNONAA >60 04/22/2023   GFRAA 100 12/21/2019    Lab Results  Component Value Date   WBC 10.8 (H) 04/22/2023   NEUTROABS 8.0 (H) 04/22/2023   HGB 11.4 (L) 04/22/2023   HCT 34.2 (L) 04/22/2023   MCV 106.2 (H) 04/22/2023   PLT 26 (L) 04/22/2023    No results found for: "TIBC", "FERRITIN", "IRONPCTSAT"   STUDIES: No results found.  ASSESSMENT AND PLAN:   [name] is a 77 y.o. 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  # Thrombocytopenia -Of unknown etiology, acute.  CBC from 04/21/2023 showed platelets of 25.  In September platelets were around 70 and in October 2024 in 140s. -I discussed with the patient and his brother in detail about different etiologies for thrombocytopenia.  Will send labs as below for workup.  Considering his history of sarcoidosis it also raises concern for ITP.  I discussed about doing Decadron 40 mg daily for 4 days.  Addendum-informed by lab.  Platelets today 26.  On peripheral blood smear review, there are less than 10% peripheral blast which raises concern for acute leukemia process.  I called the patient and advised to hold off on Decadron as it is less likely to help with the new finding on the smear.  I will follow-up with the patient again on Monday to discuss about bone marrow biopsy for further evaluation of possible acute leukemia.  He is asymptomatic.  No bleeding.  Not in blast crisis.  No indication for platelet transfusion at this time.  I have advised the patient to monitor for any bleeding and to report to ED in that event.  He was on baby aspirin intermittently in the past.  Advised to hold off.  Will also check for peripheral flow cytometry on Monday.  Orders Placed This Encounter  Procedures   CBC with  Differential/Platelet   Comprehensive metabolic panel   Vitamin B12   Folate   Hepatitis C antibody   Hepatitis B surface antigen   Hepatitis B core antibody, total   HIV ANTIBODY (ROUTINE TETSING W RELFEX)   ANA w/Reflex   Multiple Myeloma Panel (SPEP&IFE w/QIG)   Haptoglobin   Lactate dehydrogenase   Immature Platelet Fraction   Fibrinogen   CBC with Differential (Cancer Center Only)   ABO/Rh   RTC on Monday for MD visit, labs.  Patient expressed understanding and was in agreement with this plan. He also understands that He can call clinic at any time with any questions, concerns, or complaints.   I spent a total of 60 minutes reviewing chart data, face-to-face evaluation with the patient, counseling and coordination of care as detailed above.  Michaelyn Barter, MD   04/22/2023 5:15 PM

## 2023-04-22 NOTE — Progress Notes (Signed)
Patient here today for initial visit regarding thrombocytopenia. Patient denies bleeding or bruising, was having routine lab work drawn and found to have low platelet count.

## 2023-04-23 LAB — HAPTOGLOBIN: Haptoglobin: 47 mg/dL (ref 34–355)

## 2023-04-23 LAB — ANA W/REFLEX: Anti Nuclear Antibody (ANA): NEGATIVE

## 2023-04-25 ENCOUNTER — Encounter: Payer: Self-pay | Admitting: *Deleted

## 2023-04-25 ENCOUNTER — Inpatient Hospital Stay (HOSPITAL_BASED_OUTPATIENT_CLINIC_OR_DEPARTMENT_OTHER): Payer: Medicare PPO | Admitting: Internal Medicine

## 2023-04-25 ENCOUNTER — Inpatient Hospital Stay: Payer: Medicare PPO

## 2023-04-25 ENCOUNTER — Encounter: Payer: Self-pay | Admitting: Internal Medicine

## 2023-04-25 VITALS — BP 119/74 | HR 76 | Temp 97.9°F | Resp 14 | Ht 71.0 in | Wt 170.0 lb

## 2023-04-25 DIAGNOSIS — D869 Sarcoidosis, unspecified: Secondary | ICD-10-CM | POA: Diagnosis not present

## 2023-04-25 DIAGNOSIS — D4622 Refractory anemia with excess of blasts 2: Secondary | ICD-10-CM | POA: Diagnosis not present

## 2023-04-25 DIAGNOSIS — D539 Nutritional anemia, unspecified: Secondary | ICD-10-CM | POA: Insufficient documentation

## 2023-04-25 DIAGNOSIS — D696 Thrombocytopenia, unspecified: Secondary | ICD-10-CM | POA: Diagnosis not present

## 2023-04-25 DIAGNOSIS — Z7952 Long term (current) use of systemic steroids: Secondary | ICD-10-CM | POA: Diagnosis not present

## 2023-04-25 LAB — CBC WITH DIFFERENTIAL/PLATELET
Abs Immature Granulocytes: 0 10*3/uL (ref 0.00–0.07)
Basophils Absolute: 0 10*3/uL (ref 0.0–0.1)
Basophils Relative: 0 %
Blasts: 3 %
Eosinophils Absolute: 0 10*3/uL (ref 0.0–0.5)
Eosinophils Relative: 0 %
HCT: 32.4 % — ABNORMAL LOW (ref 39.0–52.0)
Hemoglobin: 10.6 g/dL — ABNORMAL LOW (ref 13.0–17.0)
Lymphocytes Relative: 19 %
Lymphs Abs: 1.9 10*3/uL (ref 0.7–4.0)
MCH: 35.1 pg — ABNORMAL HIGH (ref 26.0–34.0)
MCHC: 32.7 g/dL (ref 30.0–36.0)
MCV: 107.3 fL — ABNORMAL HIGH (ref 80.0–100.0)
Monocytes Absolute: 0.2 10*3/uL (ref 0.1–1.0)
Monocytes Relative: 2 %
Neutro Abs: 7.4 10*3/uL (ref 1.7–7.7)
Neutrophils Relative %: 76 %
Platelets: 23 10*3/uL — ABNORMAL LOW (ref 150–400)
RBC: 3.02 MIL/uL — ABNORMAL LOW (ref 4.22–5.81)
RDW: 14.4 % (ref 11.5–15.5)
Smear Review: NORMAL
WBC Morphology: 3
WBC: 9.8 10*3/uL (ref 4.0–10.5)
nRBC: 0 % (ref 0.0–0.2)

## 2023-04-25 NOTE — Patient Instructions (Signed)
Referral faxed to Dr. Vertell Limber

## 2023-04-25 NOTE — Progress Notes (Signed)
[facility]  Telephone:(336) [phone] Fax:([phone]) [phone]  ID: YOANDY PARR OB: [birth year]  MR#: [id number]  AOZ#:[id number]  Patient Care Team: Tally Joe, MD as PCP - General (Family Medicine) Salena Saner, MD as Consulting Physician (Pulmonary Disease) Michaelyn Barter, MD as Consulting Physician (Oncology)  REFERRING PROVIDER: Dr. Jayme Cloud  REASON FOR REFERRAL: thrombocytopenia  HPI: Colin DANEHY is a 77 y.o. 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  Sarcoidosis-patient follows with Dr. Jayme Cloud. Initially evaluated for mediastinal adenopathy. s/p left iliac bone biopsy showed nonnecrotizing granulomas consistent with sarcoid granulomas.  He was started on methotrexate and daily prednisone in March 2023.  In September 2024, platelets started decreasing from 140-70.  Methotrexate was discontinued.  He was maintained on prednisone 5 mg every other day.  He went for routine checkup with Dr. Jayme Cloud on 04/21/2023.  CBC showed WBC 11.2, hemoglobin 11.8, MCV 107 and platelets 25.  Patient denies any easy bruising, bleeding.  Denies any worsening shortness of breath, chest pain, dizziness, headaches, urinary symptoms, bowel issues.  Workup-hepatitis B/C/HIV nonreactive.  LDH/haptoglobin normal.  ANA negative.  Vitamin B12 and folate normal.  Fibrinogen normal.  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  Interval history Patient seen today as follow-up accompanied with brother. Denies any symptoms.  No bleeding or easy bruising.  REVIEW OF SYSTEMS:   ROS  As per HPI. Otherwise, a complete review of systems is negative.  PAST MEDICAL HISTORY: Past Medical History:  Diagnosis Date   Arthritis    BPH (benign prostatic hyperplasia) 12/08/2016    PAST SURGICAL HISTORY: Past Surgical History:  Procedure Laterality Date   BIOPSY PROSTATE      CHOLECYSTECTOMY     CYST REMOVAL NECK     TONSILLECTOMY      FAMILY HISTORY: Family History  Problem Relation Age of Onset   Diabetes Mother    Hypertension Mother    Intracerebral hemorrhage Brother    Hypertension Brother    Stroke Maternal Grandmother    Stroke Maternal Grandfather    Hypertension Brother        POSS    HEALTH MAINTENANCE: Social History   Tobacco Use   Smoking status: Never   Smokeless tobacco: Never  Vaping Use   Vaping status: Never Used  Substance Use Topics   Alcohol use: No   Drug use: No     Allergies  Allergen Reactions   Amoxicillin Other (See Comments)    Tolerated Rocephin IV on 04/25/2021      Current Outpatient Medications  Medication Sig Dispense Refill   Polyethyl Glycol-Propyl Glycol (SYSTANE) 0.4-0.3 % SOLN Apply to eye daily.     aspirin EC 81 MG EC tablet Take 1 tablet (81 mg total) by mouth daily. Swallow whole. (Patient not taking: Reported on 04/25/2023) 30 tablet 0   b complex vitamins capsule Take 1 capsule by mouth daily. (Patient not taking: Reported on 04/25/2023)     Cholecalciferol (VITAMIN D3 PO) Take 1 tablet by mouth every other day. (Patient not taking: Reported on 04/25/2023)     dexamethasone (DECADRON) 4 MG tablet Take 10 tablets (40 mg total) by mouth daily for 4 days. (Patient not taking: Reported on 04/25/2023) 40 tablet 0   No current facility-administered medications for this visit.    OBJECTIVE: Vitals:   04/25/23 1035  BP: 119/74  Pulse: 76  Resp: 14  Temp: 97.9 F (36.6 C)  SpO2: 100%     Body mass index is 23.71 kg/m.      General: Well-developed, well-nourished, no acute distress. Eyes: Pink conjunctiva, anicteric sclera. HEENT: Normocephalic, moist mucous membranes, clear oropharnyx. Lungs: Clear to auscultation bilaterally. Heart: Regular rate and rhythm. No rubs, murmurs, or gallops. Abdomen: Soft, nontender, nondistended. No organomegaly noted, normoactive bowel  sounds. Musculoskeletal: No edema, cyanosis, or clubbing. Neuro: Alert, answering all questions appropriately. Cranial nerves grossly intact. Skin: No rashes or petechiae noted. Psych: Normal affect. Lymphatics: No cervical, calvicular, axillary or inguinal LAD.   LAB RESULTS:  Lab Results  Component Value Date   NA 137 04/22/2023   K 3.8 04/22/2023   CL 103 04/22/2023   CO2 25 04/22/2023   GLUCOSE 99 04/22/2023   BUN 11 04/22/2023   CREATININE 0.68 04/22/2023   CALCIUM 9.3 04/22/2023   PROT 8.0 04/22/2023   ALBUMIN 4.5 04/22/2023   AST 17 04/22/2023   ALT 14 04/22/2023   ALKPHOS 66 04/22/2023   BILITOT 1.2 (H) 04/22/2023   GFRNONAA >60 04/22/2023   GFRAA 100 12/21/2019    Lab Results  Component Value Date   WBC 9.8 04/25/2023   NEUTROABS 7.4 04/25/2023   HGB 10.6 (L) 04/25/2023   HCT 32.4 (L) 04/25/2023   MCV 107.3 (H) 04/25/2023   PLT 23 (L) 04/25/2023    No results found for: "TIBC", "FERRITIN", "IRONPCTSAT"   STUDIES: No results found.  ASSESSMENT AND PLAN:   Colin Barron is a 77 y.o. 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  # Thrombocytopenia # Macrocytic anemia # Blasts on peripheral blood - Workup-hepatitis B/C/HIV nonreactive.  LDH/haptoglobin normal.  ANA negative.  Vitamin B12 and folate normal.  Fibrinogen normal.  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  Flow cytometry is pending. -I discussed with the patient and his brother in length about the blast in the peripheral blood strongly concerning for AML process.  He will need a bone marrow biopsy ASAP for definitive diagnosis.  Platelets today 23.  Hemoglobin 10.6.  Discussed with IR and there is no opening for bone marrow biopsy until 05/03/2023.  I reached out to Dr. Tessa Lerner, leukemia specialist at [facility] who is able to see the patient/bone marrow biopsy arranged this week.  Will make  a referral to their service.  In the meantime, we will closely monitor his blood count.  Labs only on Thursday with possible transfusion on Friday.  Patient is asymptomatic.  No bleeding.  # Sarcoidosis -Follows with Dr. Jayme Cloud.  Previously on methotrexate which was discontinued due to thrombocytopenia -Currently on prednisone 5 mg every other day.  Orders Placed This Encounter  Procedures   CBC with Differential/Platelet   Hold Tube- Blood Bank   RTC to be decided  Patient expressed understanding and was in agreement with this plan. He also understands that He can call clinic at any time with any questions, concerns, or complaints.   I spent a total of 30 minutes reviewing chart data, face-to-face evaluation with the patient, counseling and coordination of care as detailed above.  Michaelyn Barter, MD   04/25/2023 1:14 PM

## 2023-04-28 ENCOUNTER — Telehealth: Payer: Self-pay | Admitting: *Deleted

## 2023-04-28 ENCOUNTER — Ambulatory Visit: Payer: Medicare PPO | Admitting: Radiology

## 2023-04-28 ENCOUNTER — Inpatient Hospital Stay: Payer: Medicare PPO | Attending: Internal Medicine

## 2023-04-28 DIAGNOSIS — D696 Thrombocytopenia, unspecified: Secondary | ICD-10-CM | POA: Diagnosis not present

## 2023-04-28 LAB — CBC WITH DIFFERENTIAL/PLATELET
Abs Immature Granulocytes: 0 10*3/uL (ref 0.00–0.07)
Basophils Absolute: 0 10*3/uL (ref 0.0–0.1)
Basophils Relative: 0 %
Blasts: 2 %
Eosinophils Absolute: 0 10*3/uL (ref 0.0–0.5)
Eosinophils Relative: 0 %
HCT: 33.7 % — ABNORMAL LOW (ref 39.0–52.0)
Hemoglobin: 11.2 g/dL — ABNORMAL LOW (ref 13.0–17.0)
Lymphocytes Relative: 16 %
Lymphs Abs: 1.8 10*3/uL (ref 0.7–4.0)
MCH: 35.6 pg — ABNORMAL HIGH (ref 26.0–34.0)
MCHC: 33.2 g/dL (ref 30.0–36.0)
MCV: 107 fL — ABNORMAL HIGH (ref 80.0–100.0)
Monocytes Absolute: 0.2 10*3/uL (ref 0.1–1.0)
Monocytes Relative: 2 %
Neutro Abs: 9 10*3/uL — ABNORMAL HIGH (ref 1.7–7.7)
Neutrophils Relative %: 80 %
Platelets: 22 10*3/uL — ABNORMAL LOW (ref 150–400)
RBC: 3.15 MIL/uL — ABNORMAL LOW (ref 4.22–5.81)
RDW: 14.6 % (ref 11.5–15.5)
Smear Review: DECREASED
WBC: 11.3 10*3/uL — ABNORMAL HIGH (ref 4.0–10.5)
nRBC: 0 % (ref 0.0–0.2)

## 2023-04-28 LAB — COMP PANEL: LEUKEMIA/LYMPHOMA: Immunophenotypic Profile: 16

## 2023-04-28 LAB — SAMPLE TO BLOOD BANK

## 2023-04-28 NOTE — Telephone Encounter (Signed)
 VM left for patient. His hgb is 11.2 today. Canceling appt for blood transfusion tomm.

## 2023-04-29 ENCOUNTER — Ambulatory Visit: Payer: Medicare PPO | Admitting: Internal Medicine

## 2023-04-29 ENCOUNTER — Inpatient Hospital Stay: Payer: Medicare PPO

## 2023-04-29 ENCOUNTER — Other Ambulatory Visit: Payer: Medicare PPO

## 2023-05-01 LAB — MULTIPLE MYELOMA PANEL, SERUM
Albumin SerPl Elph-Mcnc: 4.5 g/dL — ABNORMAL HIGH (ref 2.9–4.4)
Albumin/Glob SerPl: 1.6 (ref 0.7–1.7)
Alpha 1: 0.2 g/dL (ref 0.0–0.4)
Alpha2 Glob SerPl Elph-Mcnc: 0.6 g/dL (ref 0.4–1.0)
B-Globulin SerPl Elph-Mcnc: 0.8 g/dL (ref 0.7–1.3)
Gamma Glob SerPl Elph-Mcnc: 1.5 g/dL (ref 0.4–1.8)
Globulin, Total: 3 g/dL (ref 2.2–3.9)
IgA: 163 mg/dL (ref 61–437)
IgG (Immunoglobin G), Serum: 1568 mg/dL (ref 603–1613)
IgM (Immunoglobulin M), Srm: 55 mg/dL (ref 15–143)
M Protein SerPl Elph-Mcnc: 1 g/dL — ABNORMAL HIGH
Total Protein ELP: 7.5 g/dL (ref 6.0–8.5)

## 2023-05-02 DIAGNOSIS — T7840XA Allergy, unspecified, initial encounter: Secondary | ICD-10-CM | POA: Diagnosis not present

## 2023-05-02 DIAGNOSIS — D72829 Elevated white blood cell count, unspecified: Secondary | ICD-10-CM | POA: Diagnosis not present

## 2023-05-02 DIAGNOSIS — T8051XA Anaphylactic reaction due to administration of blood and blood products, initial encounter: Secondary | ICD-10-CM | POA: Diagnosis not present

## 2023-05-02 DIAGNOSIS — D649 Anemia, unspecified: Secondary | ICD-10-CM | POA: Diagnosis not present

## 2023-05-02 DIAGNOSIS — C9200 Acute myeloblastic leukemia, not having achieved remission: Secondary | ICD-10-CM | POA: Diagnosis not present

## 2023-05-02 DIAGNOSIS — D696 Thrombocytopenia, unspecified: Secondary | ICD-10-CM | POA: Diagnosis not present

## 2023-05-03 ENCOUNTER — Telehealth: Payer: Self-pay | Admitting: *Deleted

## 2023-05-03 DIAGNOSIS — D696 Thrombocytopenia, unspecified: Secondary | ICD-10-CM

## 2023-05-03 DIAGNOSIS — D539 Nutritional anemia, unspecified: Secondary | ICD-10-CM

## 2023-05-03 NOTE — Telephone Encounter (Signed)
 I returned pt's phone call. Plan of care reviewed with patient. Agreeable to apts on Thursday and Friday as planned.

## 2023-05-03 NOTE — Telephone Encounter (Signed)
-----   Message from Genevia Rous sent at 05/03/2023  1:53 PM EST ----- Regarding: RE: OFFICE NOTE & ORDER- [facility] Notes reviewed from [facility]. Could we please schedule him for CBC check on Thursday and possible platelet transfusion for Friday.  Thank you ----- Message ----- From: Melba Grayce MATSU Sent: 05/03/2023   1:17 PM EST To: Powell JINNY Lin, RN; Genevia Rous, MD; # Subject: OFFICE NOTE & ORDER- [facility]         OFFICE NOTE & ORDER- [facility] sent to his chart.

## 2023-05-03 NOTE — Telephone Encounter (Signed)
   [facility] seen at [facility] yesterday and has f/u with Dr. Armida Pouch on 05/10/23 for bone marrow results. [facility] requested labs to be drawn on 1/9 for cbc w/diff. Per Dr. A-provider requested pt to have lab apt on Thursday and set up for possible plt transfusion on Friday. Last plt count at [facility] went from 18 to 44 after t received plts transfusion at [facility].  Lab orders entered - cbc and hold tube entered. Msg sent to scheduling to arrange.

## 2023-05-05 ENCOUNTER — Inpatient Hospital Stay: Payer: Medicare PPO

## 2023-05-05 ENCOUNTER — Telehealth: Payer: Self-pay | Admitting: *Deleted

## 2023-05-05 DIAGNOSIS — D72829 Elevated white blood cell count, unspecified: Secondary | ICD-10-CM | POA: Diagnosis not present

## 2023-05-05 DIAGNOSIS — D539 Nutritional anemia, unspecified: Secondary | ICD-10-CM

## 2023-05-05 DIAGNOSIS — D649 Anemia, unspecified: Secondary | ICD-10-CM | POA: Diagnosis not present

## 2023-05-05 DIAGNOSIS — D696 Thrombocytopenia, unspecified: Secondary | ICD-10-CM | POA: Diagnosis not present

## 2023-05-05 LAB — CBC WITH DIFFERENTIAL (CANCER CENTER ONLY)
Abs Immature Granulocytes: 0.1 10*3/uL — ABNORMAL HIGH (ref 0.00–0.07)
Basophils Absolute: 0 10*3/uL (ref 0.0–0.1)
Basophils Relative: 0 %
Blasts: 6 %
Eosinophils Absolute: 0 10*3/uL (ref 0.0–0.5)
Eosinophils Relative: 0 %
HCT: 31.2 % — ABNORMAL LOW (ref 39.0–52.0)
Hemoglobin: 10.4 g/dL — ABNORMAL LOW (ref 13.0–17.0)
Lymphocytes Relative: 13 %
Lymphs Abs: 1.9 10*3/uL (ref 0.7–4.0)
MCH: 35.6 pg — ABNORMAL HIGH (ref 26.0–34.0)
MCHC: 33.3 g/dL (ref 30.0–36.0)
MCV: 106.8 fL — ABNORMAL HIGH (ref 80.0–100.0)
Monocytes Absolute: 0.4 10*3/uL (ref 0.1–1.0)
Monocytes Relative: 3 %
Myelocytes: 1 %
Neutro Abs: 11 10*3/uL — ABNORMAL HIGH (ref 1.7–7.7)
Neutrophils Relative %: 77 %
Platelet Count: 29 10*3/uL — ABNORMAL LOW (ref 150–400)
RBC: 2.92 MIL/uL — ABNORMAL LOW (ref 4.22–5.81)
RDW: 14.6 % (ref 11.5–15.5)
Smear Review: NORMAL
WBC Count: 14.3 10*3/uL — ABNORMAL HIGH (ref 4.0–10.5)
WBC Morphology: 27
nRBC: 0 % (ref 0.0–0.2)

## 2023-05-05 LAB — SAMPLE TO BLOOD BANK

## 2023-05-05 NOTE — Telephone Encounter (Signed)
 Spoke with patient. Plt count is 29 today. Pt does not need any plt transfusion tomorrow-this apt has been cnl. Msg to scheduling sent to cnl apts for tom. Pt has [facility] at [facility] on Tuesday. Pt was instructed to keep the [facility] apt on Tuesday 1/14 as planned. [facility] will recheck labs at that time.   Per v/o Dr. DELENA- we have scheduled pt to return on Thursday 1/16 for lab check and possible plt transfusion on 1/17.  Pt given these new apts and gave verbal understanding of the plan of care. He thanked me for calling him.

## 2023-05-06 ENCOUNTER — Inpatient Hospital Stay: Payer: Medicare PPO

## 2023-05-09 DIAGNOSIS — D696 Thrombocytopenia, unspecified: Secondary | ICD-10-CM | POA: Diagnosis not present

## 2023-05-09 DIAGNOSIS — D72829 Elevated white blood cell count, unspecified: Secondary | ICD-10-CM | POA: Diagnosis not present

## 2023-05-09 DIAGNOSIS — D649 Anemia, unspecified: Secondary | ICD-10-CM | POA: Diagnosis not present

## 2023-05-10 DIAGNOSIS — C9200 Acute myeloblastic leukemia, not having achieved remission: Secondary | ICD-10-CM | POA: Diagnosis not present

## 2023-05-10 DIAGNOSIS — D61818 Other pancytopenia: Secondary | ICD-10-CM | POA: Diagnosis not present

## 2023-05-11 ENCOUNTER — Telehealth: Payer: Self-pay | Admitting: *Deleted

## 2023-05-11 DIAGNOSIS — D696 Thrombocytopenia, unspecified: Secondary | ICD-10-CM

## 2023-05-11 DIAGNOSIS — D539 Nutritional anemia, unspecified: Secondary | ICD-10-CM

## 2023-05-11 NOTE — Telephone Encounter (Signed)
 Note: pt's plt count was 20 on 05/09/22

## 2023-05-11 NOTE — Telephone Encounter (Signed)
 Per Burdette Carolin in cancer center lab - Per lab - A PML/RARA Fish Panel was added to [employer] labs from 04/25/2023. We need an order put in [facility] so that it can be resulted. Test number [id number]   [facility] - per Kenney Peacemaker, NP to send order under Dr. Aris Bel

## 2023-05-12 ENCOUNTER — Inpatient Hospital Stay: Payer: Medicare PPO

## 2023-05-12 ENCOUNTER — Telehealth: Payer: Self-pay

## 2023-05-12 DIAGNOSIS — D696 Thrombocytopenia, unspecified: Secondary | ICD-10-CM

## 2023-05-12 LAB — CBC WITH DIFFERENTIAL/PLATELET
Abs Immature Granulocytes: 0.7 10*3/uL — ABNORMAL HIGH (ref 0.00–0.07)
Basophils Absolute: 0 10*3/uL (ref 0.0–0.1)
Basophils Relative: 0 %
Blasts: 4 %
Eosinophils Absolute: 0.2 10*3/uL (ref 0.0–0.5)
Eosinophils Relative: 1 %
HCT: 28.4 % — ABNORMAL LOW (ref 39.0–52.0)
Hemoglobin: 9.4 g/dL — ABNORMAL LOW (ref 13.0–17.0)
Lymphocytes Relative: 14 %
Lymphs Abs: 2.6 10*3/uL (ref 0.7–4.0)
MCH: 35.3 pg — ABNORMAL HIGH (ref 26.0–34.0)
MCHC: 33.1 g/dL (ref 30.0–36.0)
MCV: 106.8 fL — ABNORMAL HIGH (ref 80.0–100.0)
Metamyelocytes Relative: 1 %
Monocytes Absolute: 0.6 10*3/uL (ref 0.1–1.0)
Monocytes Relative: 3 %
Myelocytes: 3 %
Neutro Abs: 13.7 10*3/uL — ABNORMAL HIGH (ref 1.7–7.7)
Neutrophils Relative %: 74 %
Platelets: 22 10*3/uL — ABNORMAL LOW (ref 150–400)
RBC: 2.66 MIL/uL — ABNORMAL LOW (ref 4.22–5.81)
RDW: 14.3 % (ref 11.5–15.5)
Smear Review: NORMAL
WBC: 18.5 10*3/uL — ABNORMAL HIGH (ref 4.0–10.5)
nRBC: 0 % (ref 0.0–0.2)

## 2023-05-12 LAB — MISC LABCORP TEST (SEND OUT): Labcorp test code: 511834

## 2023-05-12 LAB — SAMPLE TO BLOOD BANK

## 2023-05-12 NOTE — Telephone Encounter (Signed)
Cherry Turlington RN Left vm for [facility] to determine if [facility] is rechecking the labs next week or if our cancer ctr needs to recheck.

## 2023-05-12 NOTE — Telephone Encounter (Signed)
Called and spoke to patient in regards to his appointment that was scheduled for tomorrow for him to come in for a blood transfusion. Based on his most recent lab results (hgb-9.4, platelets-22), he doesn't need the blood transfusion. Patient understood and agreed with this plan.     [facility] Note: Blood product support: Leukoreduced blood products are required. Irradiated blood products are preferred, but in case of urgent transfusion needs non-irradiated blood products may be used:   - RBC transfusion threshold: transfuse 2 units for Hgb < 8 g/dL. - Platelet transfusion threshold: transfuse 1 unit of platelets for platelet count < 10, or for bleeding or need for invasive procedure.  Based on the patient's disease status and intensity of therapy, complete blood count with differential should be evaluated 2 times per week and used to guide transfusion support

## 2023-05-12 NOTE — Telephone Encounter (Signed)
I spoke with Georgiann Hahn, RN at [facility]. Explained that plt count was 22 today. Plts transfusion is not needed tomorrow. I confirmed with [facility] that Pt does not have any current lab apts set up for next week at [facility]. Georgiann Hahn will get with Dr. Mayford Knife to determine f/u plan for [facility] and call me back with the plan.  In the meantime, pt can be set up here at [facility] cancer center for weekly lab check x 2 and hold chair for possible plts. I attempted to reach patient to discuss plan of care. Dr. Mervyn Skeeters please advise on any additional follow-up with you.  Per request of [facility]- Msg sent to our scheduling team to arrange the following:   please schedule patient twice weekly for the next 2 weeks. day 1 lab and day 2 for possible plt transfusion  Monday 1/20 for lab only; Tuesday 1/21 possible 1 unit of plts  Thursday 1/23 for lab only; Friday 1/24 for possible 1 unit of plts    Monday 1/27 for lab only; Tuesday 1/28 possible 1 unit of plts  Thursday 1/30 for lab only; Friday 1/31 for possible 1 unit of plts

## 2023-05-13 ENCOUNTER — Inpatient Hospital Stay: Payer: Medicare PPO

## 2023-05-13 NOTE — Telephone Encounter (Signed)
Scheduling Notes:  Please call brother Colin Barron He is saying we need to coordinate appointments with [facility]- [phone]- He is with patient. I will schedule when all is clear.  Thank you    Rn contacted pt's brother, Colin Barron. Informed pt's brother that the [facility] nurse and I spoke yesterday. Pt will need blood checks and possible plt transfusion twice weekly. Brother doesn't want to duplicate apts with [facility].  I explained that [facility] will contact me back today to confirm Dr. Mayford Barron' plan for follow-up at [facility]. We will go ahead and schedule apts for at least Monday for lab check and possible plts on Tuesday. If the plan changes with [facility], we can always adjust the schedule. No need to duplicate labs at [facility]. Brother thanked me for calling him back. He has also placed a phone call to [facility] as well to confirm plan of care. We will be back in touch this afternoon as soon as [facility] as communicated plan of care to me.

## 2023-05-13 NOTE — Telephone Encounter (Signed)
Pt brother Romeo Apple called and left a vm stating that he spoke with [facility] and they said to keep the appts on Monday and Tuesday here at the [facility].  I have called Romeo Apple back and let him know that we received the voicemail and appts are still scheduled and I would let Heather know.

## 2023-05-16 ENCOUNTER — Telehealth: Payer: Self-pay | Admitting: *Deleted

## 2023-05-16 ENCOUNTER — Inpatient Hospital Stay: Payer: Medicare PPO

## 2023-05-16 DIAGNOSIS — D696 Thrombocytopenia, unspecified: Secondary | ICD-10-CM | POA: Diagnosis not present

## 2023-05-16 LAB — CBC WITH DIFFERENTIAL/PLATELET
Abs Immature Granulocytes: 1 10*3/uL — ABNORMAL HIGH (ref 0.00–0.07)
Basophils Absolute: 0 10*3/uL (ref 0.0–0.1)
Basophils Relative: 0 %
Blasts: 5 %
Eosinophils Absolute: 0 10*3/uL (ref 0.0–0.5)
Eosinophils Relative: 0 %
HCT: 28.9 % — ABNORMAL LOW (ref 39.0–52.0)
Hemoglobin: 9.4 g/dL — ABNORMAL LOW (ref 13.0–17.0)
Lymphocytes Relative: 19 %
Lymphs Abs: 3.7 10*3/uL (ref 0.7–4.0)
MCH: 35.1 pg — ABNORMAL HIGH (ref 26.0–34.0)
MCHC: 32.5 g/dL (ref 30.0–36.0)
MCV: 107.8 fL — ABNORMAL HIGH (ref 80.0–100.0)
Metamyelocytes Relative: 1 %
Monocytes Absolute: 0.4 10*3/uL (ref 0.1–1.0)
Monocytes Relative: 2 %
Myelocytes: 4 %
Neutro Abs: 13.4 10*3/uL — ABNORMAL HIGH (ref 1.7–7.7)
Neutrophils Relative %: 69 %
Platelets: 23 10*3/uL — ABNORMAL LOW (ref 150–400)
RBC: 2.68 MIL/uL — ABNORMAL LOW (ref 4.22–5.81)
RDW: 14.6 % (ref 11.5–15.5)
Smear Review: NORMAL
WBC: 19.4 10*3/uL — ABNORMAL HIGH (ref 4.0–10.5)
nRBC: 0 % (ref 0.0–0.2)

## 2023-05-16 LAB — SAMPLE TO BLOOD BANK

## 2023-05-16 NOTE — Telephone Encounter (Signed)
Left vm for patient and pt's brother. We can cnl the plt transfusion tomorrow given plt count is 23 and holding steady. Per dr. Mervyn Skeeters- have pt keep apts on Thursday for lab check and possible plt transfusion on Friday.  I am attempting to reach the [facility] coordinator/nurse navigator to determine follow-up plan. I have not heard back from [facility] from our conversation with Georgiann Hahn, RN last Friday 1/17. Appears that [facility] entered orders on 05/13/23 for INQOVI oral to treat pt's MDS. Per answerig service, the office is closed due to [facility]. Receiver offered me to transfer to the on call doctor, but I opted to try calling [facility] back again tomorrow at [phone].  Mychart msg sent to patient.

## 2023-05-17 ENCOUNTER — Inpatient Hospital Stay: Payer: Medicare PPO

## 2023-05-19 ENCOUNTER — Inpatient Hospital Stay: Payer: Medicare PPO

## 2023-05-19 ENCOUNTER — Telehealth: Payer: Self-pay | Admitting: *Deleted

## 2023-05-19 DIAGNOSIS — D696 Thrombocytopenia, unspecified: Secondary | ICD-10-CM | POA: Diagnosis not present

## 2023-05-19 LAB — CBC WITH DIFFERENTIAL/PLATELET
Abs Immature Granulocytes: 0 10*3/uL (ref 0.00–0.07)
Basophils Absolute: 0 10*3/uL (ref 0.0–0.1)
Basophils Relative: 0 %
Blasts: 7 %
Eosinophils Absolute: 0 10*3/uL (ref 0.0–0.5)
Eosinophils Relative: 0 %
HCT: 29 % — ABNORMAL LOW (ref 39.0–52.0)
Hemoglobin: 9.3 g/dL — ABNORMAL LOW (ref 13.0–17.0)
Lymphocytes Relative: 24 %
Lymphs Abs: 3.8 10*3/uL (ref 0.7–4.0)
MCH: 34.7 pg — ABNORMAL HIGH (ref 26.0–34.0)
MCHC: 32.1 g/dL (ref 30.0–36.0)
MCV: 108.2 fL — ABNORMAL HIGH (ref 80.0–100.0)
Monocytes Absolute: 0.6 10*3/uL (ref 0.1–1.0)
Monocytes Relative: 4 %
Neutro Abs: 10.3 10*3/uL — ABNORMAL HIGH (ref 1.7–7.7)
Neutrophils Relative %: 65 %
Platelets: 22 10*3/uL — ABNORMAL LOW (ref 150–400)
RBC: 2.68 MIL/uL — ABNORMAL LOW (ref 4.22–5.81)
RDW: 14.6 % (ref 11.5–15.5)
Smear Review: NORMAL
WBC Morphology: 7
WBC: 15.9 10*3/uL — ABNORMAL HIGH (ref 4.0–10.5)
nRBC: 0 % (ref 0.0–0.2)

## 2023-05-19 LAB — SAMPLE TO BLOOD BANK

## 2023-05-19 NOTE — Telephone Encounter (Signed)
Spoke with patient. Pt does not need any plt transfusion tomorrow. Plt count at 22. Plt counts will be rechecked on Monday. Will hold chair on Tuesday 01/27 in case pt needs iv plt transfusion. Pt will keep [place] informed if apts at [facility] are arranged so we can avoid duplicating lab visits.

## 2023-05-20 ENCOUNTER — Inpatient Hospital Stay: Payer: Medicare PPO

## 2023-05-23 ENCOUNTER — Inpatient Hospital Stay: Payer: Medicare PPO

## 2023-05-23 ENCOUNTER — Telehealth: Payer: Self-pay | Admitting: *Deleted

## 2023-05-23 DIAGNOSIS — D696 Thrombocytopenia, unspecified: Secondary | ICD-10-CM | POA: Diagnosis not present

## 2023-05-23 LAB — CBC WITH DIFFERENTIAL/PLATELET
Abs Immature Granulocytes: 0.71 10*3/uL — ABNORMAL HIGH (ref 0.00–0.07)
Band Neutrophils: 0 %
Basophils Absolute: 0 10*3/uL (ref 0.0–0.1)
Basophils Relative: 0 %
Blasts: 15 %
Eosinophils Absolute: 0 10*3/uL (ref 0.0–0.5)
Eosinophils Relative: 0 %
HCT: 29.8 % — ABNORMAL LOW (ref 39.0–52.0)
Hemoglobin: 9.7 g/dL — ABNORMAL LOW (ref 13.0–17.0)
Immature Granulocytes: 0 %
Lymphocytes Relative: 12 %
Lymphs Abs: 1.7 10*3/uL (ref 0.7–4.0)
MCH: 35.3 pg — ABNORMAL HIGH (ref 26.0–34.0)
MCHC: 32.6 g/dL (ref 30.0–36.0)
MCV: 108.4 fL — ABNORMAL HIGH (ref 80.0–100.0)
Metamyelocytes Relative: 0 %
Monocytes Absolute: 0.3 10*3/uL (ref 0.1–1.0)
Monocytes Relative: 2 %
Myelocytes: 4 %
Neutro Abs: 9.3 10*3/uL — ABNORMAL HIGH (ref 1.7–7.7)
Neutrophils Relative %: 66 %
Other: 0 %
Platelets: 21 10*3/uL — ABNORMAL LOW (ref 150–400)
Promyelocytes Relative: 1 %
RBC: 2.75 MIL/uL — ABNORMAL LOW (ref 4.22–5.81)
RDW: 14.7 % (ref 11.5–15.5)
Smear Review: DECREASED
WBC: 14.1 10*3/uL — ABNORMAL HIGH (ref 4.0–10.5)
nRBC: 0 % (ref 0.0–0.2)
nRBC: 0 /100{WBCs}

## 2023-05-23 LAB — SAMPLE TO BLOOD BANK

## 2023-05-23 NOTE — Telephone Encounter (Signed)
Patient does not need plt transfusion tomorrow. Plt count 22. Notified pt to keep apts as scheduled on Thursday. Pt has not heard back from [facility] regarding his appointments. Encouraged pt/family to follow-up with [facility].

## 2023-05-23 NOTE — Telephone Encounter (Signed)
1157-Critical value called by chase in cancer center lab- Blast count 15. Critical value repeated to lab tech. Read back process performed with Dr. Maryla Morrow.

## 2023-05-24 ENCOUNTER — Inpatient Hospital Stay: Payer: Medicare PPO

## 2023-05-26 ENCOUNTER — Telehealth: Payer: Self-pay | Admitting: *Deleted

## 2023-05-26 ENCOUNTER — Inpatient Hospital Stay: Payer: Medicare PPO

## 2023-05-26 DIAGNOSIS — D696 Thrombocytopenia, unspecified: Secondary | ICD-10-CM

## 2023-05-26 LAB — CBC WITH DIFFERENTIAL/PLATELET
Abs Immature Granulocytes: 0.3 10*3/uL — ABNORMAL HIGH (ref 0.00–0.07)
Basophils Absolute: 0 10*3/uL (ref 0.0–0.1)
Basophils Relative: 0 %
Blasts: 17 %
Eosinophils Absolute: 0.1 10*3/uL (ref 0.0–0.5)
Eosinophils Relative: 1 %
HCT: 30.5 % — ABNORMAL LOW (ref 39.0–52.0)
Hemoglobin: 9.9 g/dL — ABNORMAL LOW (ref 13.0–17.0)
Lymphocytes Relative: 18 %
Lymphs Abs: 2.3 10*3/uL (ref 0.7–4.0)
MCH: 35.2 pg — ABNORMAL HIGH (ref 26.0–34.0)
MCHC: 32.5 g/dL (ref 30.0–36.0)
MCV: 108.5 fL — ABNORMAL HIGH (ref 80.0–100.0)
Monocytes Absolute: 0 10*3/uL — ABNORMAL LOW (ref 0.1–1.0)
Monocytes Relative: 0 %
Myelocytes: 2 %
Neutro Abs: 7.9 10*3/uL — ABNORMAL HIGH (ref 1.7–7.7)
Neutrophils Relative %: 62 %
Platelets: 18 10*3/uL — ABNORMAL LOW (ref 150–400)
RBC: 2.81 MIL/uL — ABNORMAL LOW (ref 4.22–5.81)
RDW: 14.9 % (ref 11.5–15.5)
Smear Review: NORMAL
WBC: 12.7 10*3/uL — ABNORMAL HIGH (ref 4.0–10.5)
nRBC: 0 % (ref 0.0–0.2)

## 2023-05-26 LAB — SAMPLE TO BLOOD BANK

## 2023-05-26 LAB — PATHOLOGIST SMEAR REVIEW

## 2023-05-26 NOTE — Telephone Encounter (Signed)
Called patient. Pt plt count is 18 today. No plt transfusion needed tom. We can cnl this apt. Pt aware that we can cnl. He has apts on Monday at [facility] for labs and see Dr. Mayford Knife. Pt understands that [facility] will guide his care. If needed, cancer center can check labs as needed per direction of [facility]. Pt thanked me for providing care.

## 2023-05-26 NOTE — Telephone Encounter (Signed)
Called per request of Celso Sickle for critical lab results.  Reports that 17 blasts were seen in labwork today. RN verbalized understanding and Dr Alena Bills notified.

## 2023-05-26 NOTE — Telephone Encounter (Signed)
Duplicate encounter

## 2023-05-27 ENCOUNTER — Inpatient Hospital Stay: Payer: Medicare PPO

## 2023-05-30 DIAGNOSIS — Z1159 Encounter for screening for other viral diseases: Secondary | ICD-10-CM | POA: Diagnosis not present

## 2023-05-30 DIAGNOSIS — D469 Myelodysplastic syndrome, unspecified: Secondary | ICD-10-CM | POA: Diagnosis not present

## 2023-05-30 DIAGNOSIS — C9200 Acute myeloblastic leukemia, not having achieved remission: Secondary | ICD-10-CM | POA: Diagnosis not present

## 2023-05-31 DIAGNOSIS — N401 Enlarged prostate with lower urinary tract symptoms: Secondary | ICD-10-CM | POA: Diagnosis not present

## 2023-05-31 DIAGNOSIS — D469 Myelodysplastic syndrome, unspecified: Secondary | ICD-10-CM | POA: Diagnosis not present

## 2023-05-31 DIAGNOSIS — D630 Anemia in neoplastic disease: Secondary | ICD-10-CM | POA: Diagnosis not present

## 2023-05-31 DIAGNOSIS — R338 Other retention of urine: Secondary | ICD-10-CM | POA: Diagnosis not present

## 2023-05-31 DIAGNOSIS — Z87442 Personal history of urinary calculi: Secondary | ICD-10-CM | POA: Diagnosis not present

## 2023-05-31 DIAGNOSIS — Z8042 Family history of malignant neoplasm of prostate: Secondary | ICD-10-CM | POA: Diagnosis not present

## 2023-05-31 DIAGNOSIS — Z1159 Encounter for screening for other viral diseases: Secondary | ICD-10-CM | POA: Diagnosis not present

## 2023-06-01 DIAGNOSIS — Z5111 Encounter for antineoplastic chemotherapy: Secondary | ICD-10-CM | POA: Diagnosis not present

## 2023-06-01 DIAGNOSIS — D469 Myelodysplastic syndrome, unspecified: Secondary | ICD-10-CM | POA: Diagnosis not present

## 2023-06-01 DIAGNOSIS — Z1159 Encounter for screening for other viral diseases: Secondary | ICD-10-CM | POA: Diagnosis not present

## 2023-06-02 DIAGNOSIS — D469 Myelodysplastic syndrome, unspecified: Secondary | ICD-10-CM | POA: Diagnosis not present

## 2023-06-02 DIAGNOSIS — Z1159 Encounter for screening for other viral diseases: Secondary | ICD-10-CM | POA: Diagnosis not present

## 2023-06-03 DIAGNOSIS — Z5111 Encounter for antineoplastic chemotherapy: Secondary | ICD-10-CM | POA: Diagnosis not present

## 2023-06-03 DIAGNOSIS — D649 Anemia, unspecified: Secondary | ICD-10-CM | POA: Diagnosis not present

## 2023-06-03 DIAGNOSIS — D696 Thrombocytopenia, unspecified: Secondary | ICD-10-CM | POA: Diagnosis not present

## 2023-06-03 DIAGNOSIS — Z1159 Encounter for screening for other viral diseases: Secondary | ICD-10-CM | POA: Diagnosis not present

## 2023-06-04 DIAGNOSIS — D469 Myelodysplastic syndrome, unspecified: Secondary | ICD-10-CM | POA: Diagnosis not present

## 2023-06-04 DIAGNOSIS — Z5111 Encounter for antineoplastic chemotherapy: Secondary | ICD-10-CM | POA: Diagnosis not present

## 2023-06-04 DIAGNOSIS — Z1159 Encounter for screening for other viral diseases: Secondary | ICD-10-CM | POA: Diagnosis not present

## 2023-06-09 DIAGNOSIS — C9200 Acute myeloblastic leukemia, not having achieved remission: Secondary | ICD-10-CM | POA: Diagnosis not present

## 2023-06-09 DIAGNOSIS — D696 Thrombocytopenia, unspecified: Secondary | ICD-10-CM | POA: Diagnosis not present

## 2023-06-13 DIAGNOSIS — Z880 Allergy status to penicillin: Secondary | ICD-10-CM | POA: Diagnosis not present

## 2023-06-13 DIAGNOSIS — D469 Myelodysplastic syndrome, unspecified: Secondary | ICD-10-CM | POA: Diagnosis not present

## 2023-06-13 DIAGNOSIS — Z8042 Family history of malignant neoplasm of prostate: Secondary | ICD-10-CM | POA: Diagnosis not present

## 2023-06-13 DIAGNOSIS — C9200 Acute myeloblastic leukemia, not having achieved remission: Secondary | ICD-10-CM | POA: Diagnosis not present

## 2023-06-14 ENCOUNTER — Other Ambulatory Visit: Payer: Self-pay | Admitting: Pulmonary Disease

## 2023-06-14 NOTE — Telephone Encounter (Signed)
 Methotrexate was DISCONTINUED and it is NOT to be renewed.  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

## 2023-06-16 DIAGNOSIS — D649 Anemia, unspecified: Secondary | ICD-10-CM | POA: Diagnosis not present

## 2023-06-16 DIAGNOSIS — D696 Thrombocytopenia, unspecified: Secondary | ICD-10-CM | POA: Diagnosis not present

## 2023-06-20 DIAGNOSIS — C9200 Acute myeloblastic leukemia, not having achieved remission: Secondary | ICD-10-CM | POA: Diagnosis not present

## 2023-06-20 DIAGNOSIS — D696 Thrombocytopenia, unspecified: Secondary | ICD-10-CM | POA: Diagnosis not present

## 2023-06-23 DIAGNOSIS — C9200 Acute myeloblastic leukemia, not having achieved remission: Secondary | ICD-10-CM | POA: Diagnosis not present

## 2023-06-23 DIAGNOSIS — D696 Thrombocytopenia, unspecified: Secondary | ICD-10-CM | POA: Diagnosis not present

## 2023-06-24 DIAGNOSIS — Z452 Encounter for adjustment and management of vascular access device: Secondary | ICD-10-CM | POA: Diagnosis not present

## 2023-06-24 DIAGNOSIS — D469 Myelodysplastic syndrome, unspecified: Secondary | ICD-10-CM | POA: Diagnosis not present

## 2023-06-24 DIAGNOSIS — Z880 Allergy status to penicillin: Secondary | ICD-10-CM | POA: Diagnosis not present

## 2023-06-27 DIAGNOSIS — N400 Enlarged prostate without lower urinary tract symptoms: Secondary | ICD-10-CM | POA: Diagnosis not present

## 2023-06-27 DIAGNOSIS — D860 Sarcoidosis of lung: Secondary | ICD-10-CM | POA: Diagnosis not present

## 2023-06-27 DIAGNOSIS — Z79899 Other long term (current) drug therapy: Secondary | ICD-10-CM | POA: Diagnosis not present

## 2023-06-27 DIAGNOSIS — C9200 Acute myeloblastic leukemia, not having achieved remission: Secondary | ICD-10-CM | POA: Diagnosis not present

## 2023-06-27 DIAGNOSIS — D8689 Sarcoidosis of other sites: Secondary | ICD-10-CM | POA: Diagnosis not present

## 2023-06-27 DIAGNOSIS — D696 Thrombocytopenia, unspecified: Secondary | ICD-10-CM | POA: Diagnosis not present

## 2023-06-27 DIAGNOSIS — D469 Myelodysplastic syndrome, unspecified: Secondary | ICD-10-CM | POA: Diagnosis not present

## 2023-06-27 DIAGNOSIS — Z8042 Family history of malignant neoplasm of prostate: Secondary | ICD-10-CM | POA: Diagnosis not present

## 2023-06-27 DIAGNOSIS — Z7952 Long term (current) use of systemic steroids: Secondary | ICD-10-CM | POA: Diagnosis not present

## 2023-06-28 DIAGNOSIS — D469 Myelodysplastic syndrome, unspecified: Secondary | ICD-10-CM | POA: Diagnosis not present

## 2023-06-28 DIAGNOSIS — Z880 Allergy status to penicillin: Secondary | ICD-10-CM | POA: Diagnosis not present

## 2023-06-30 DIAGNOSIS — D469 Myelodysplastic syndrome, unspecified: Secondary | ICD-10-CM | POA: Diagnosis not present

## 2023-07-02 ENCOUNTER — Inpatient Hospital Stay
Admission: EM | Admit: 2023-07-02 | Discharge: 2023-07-07 | DRG: 315 | Disposition: A | Discharge status: Home Health | Attending: Internal Medicine | Admitting: Internal Medicine

## 2023-07-02 ENCOUNTER — Other Ambulatory Visit: Payer: Self-pay

## 2023-07-02 DIAGNOSIS — Z833 Family history of diabetes mellitus: Secondary | ICD-10-CM

## 2023-07-02 DIAGNOSIS — Z7982 Long term (current) use of aspirin: Secondary | ICD-10-CM

## 2023-07-02 DIAGNOSIS — D869 Sarcoidosis, unspecified: Secondary | ICD-10-CM | POA: Diagnosis present

## 2023-07-02 DIAGNOSIS — Z9049 Acquired absence of other specified parts of digestive tract: Secondary | ICD-10-CM | POA: Diagnosis not present

## 2023-07-02 DIAGNOSIS — N400 Enlarged prostate without lower urinary tract symptoms: Secondary | ICD-10-CM | POA: Diagnosis present

## 2023-07-02 DIAGNOSIS — Z823 Family history of stroke: Secondary | ICD-10-CM | POA: Diagnosis not present

## 2023-07-02 DIAGNOSIS — Z880 Allergy status to penicillin: Secondary | ICD-10-CM | POA: Diagnosis not present

## 2023-07-02 DIAGNOSIS — T783XXA Angioneurotic edema, initial encounter: Secondary | ICD-10-CM | POA: Diagnosis not present

## 2023-07-02 DIAGNOSIS — D649 Anemia, unspecified: Secondary | ICD-10-CM | POA: Diagnosis not present

## 2023-07-02 DIAGNOSIS — D696 Thrombocytopenia, unspecified: Secondary | ICD-10-CM | POA: Diagnosis present

## 2023-07-02 DIAGNOSIS — Y838 Other surgical procedures as the cause of abnormal reaction of the patient, or of later complication, without mention of misadventure at the time of the procedure: Secondary | ICD-10-CM | POA: Diagnosis present

## 2023-07-02 DIAGNOSIS — D6959 Other secondary thrombocytopenia: Secondary | ICD-10-CM | POA: Diagnosis present

## 2023-07-02 DIAGNOSIS — E871 Hypo-osmolality and hyponatremia: Secondary | ICD-10-CM | POA: Diagnosis present

## 2023-07-02 DIAGNOSIS — R609 Edema, unspecified: Secondary | ICD-10-CM | POA: Diagnosis not present

## 2023-07-02 DIAGNOSIS — D62 Acute posthemorrhagic anemia: Secondary | ICD-10-CM | POA: Diagnosis not present

## 2023-07-02 DIAGNOSIS — M1990 Unspecified osteoarthritis, unspecified site: Secondary | ICD-10-CM | POA: Diagnosis present

## 2023-07-02 DIAGNOSIS — R58 Hemorrhage, not elsewhere classified: Secondary | ICD-10-CM | POA: Diagnosis present

## 2023-07-02 DIAGNOSIS — Z881 Allergy status to other antibiotic agents status: Secondary | ICD-10-CM

## 2023-07-02 DIAGNOSIS — Z4682 Encounter for fitting and adjustment of non-vascular catheter: Secondary | ICD-10-CM | POA: Diagnosis not present

## 2023-07-02 DIAGNOSIS — D6189 Other specified aplastic anemias and other bone marrow failure syndromes: Secondary | ICD-10-CM | POA: Diagnosis not present

## 2023-07-02 DIAGNOSIS — M1000 Idiopathic gout, unspecified site: Secondary | ICD-10-CM | POA: Diagnosis not present

## 2023-07-02 DIAGNOSIS — M109 Gout, unspecified: Secondary | ICD-10-CM | POA: Diagnosis present

## 2023-07-02 DIAGNOSIS — R531 Weakness: Secondary | ICD-10-CM | POA: Diagnosis not present

## 2023-07-02 DIAGNOSIS — Z8249 Family history of ischemic heart disease and other diseases of the circulatory system: Secondary | ICD-10-CM | POA: Diagnosis not present

## 2023-07-02 DIAGNOSIS — D469 Myelodysplastic syndrome, unspecified: Secondary | ICD-10-CM | POA: Diagnosis present

## 2023-07-02 DIAGNOSIS — T829XXA Unspecified complication of cardiac and vascular prosthetic device, implant and graft, initial encounter: Principal | ICD-10-CM

## 2023-07-02 DIAGNOSIS — T82838A Hemorrhage of vascular prosthetic devices, implants and grafts, initial encounter: Secondary | ICD-10-CM | POA: Diagnosis present

## 2023-07-02 DIAGNOSIS — R918 Other nonspecific abnormal finding of lung field: Secondary | ICD-10-CM | POA: Diagnosis not present

## 2023-07-02 DIAGNOSIS — I951 Orthostatic hypotension: Secondary | ICD-10-CM | POA: Diagnosis not present

## 2023-07-02 DIAGNOSIS — R079 Chest pain, unspecified: Secondary | ICD-10-CM | POA: Diagnosis not present

## 2023-07-02 DIAGNOSIS — R4182 Altered mental status, unspecified: Secondary | ICD-10-CM | POA: Diagnosis not present

## 2023-07-02 DIAGNOSIS — R410 Disorientation, unspecified: Secondary | ICD-10-CM | POA: Diagnosis not present

## 2023-07-02 NOTE — ED Triage Notes (Addendum)
 Pt to ed from home for blood and swelling around his recently placed port in his chest. Pt is undergoing treatment at [facility] for MDS. They called [facility] on-call and they told him to come to closest facility. Pt is caox4, in no acute distress and ambulatory in triage. Pt has significant amount of bruising and swelling to his right rib cage, and underneath both arms. Pt has bleeding soaking through his guaze covering his port.

## 2023-07-03 ENCOUNTER — Emergency Department

## 2023-07-03 ENCOUNTER — Inpatient Hospital Stay

## 2023-07-03 DIAGNOSIS — M109 Gout, unspecified: Secondary | ICD-10-CM | POA: Insufficient documentation

## 2023-07-03 DIAGNOSIS — D696 Thrombocytopenia, unspecified: Secondary | ICD-10-CM | POA: Diagnosis not present

## 2023-07-03 DIAGNOSIS — M1000 Idiopathic gout, unspecified site: Secondary | ICD-10-CM | POA: Diagnosis not present

## 2023-07-03 DIAGNOSIS — T783XXA Angioneurotic edema, initial encounter: Secondary | ICD-10-CM | POA: Diagnosis not present

## 2023-07-03 DIAGNOSIS — Z9049 Acquired absence of other specified parts of digestive tract: Secondary | ICD-10-CM | POA: Diagnosis not present

## 2023-07-03 DIAGNOSIS — Z7982 Long term (current) use of aspirin: Secondary | ICD-10-CM | POA: Diagnosis not present

## 2023-07-03 DIAGNOSIS — Z880 Allergy status to penicillin: Secondary | ICD-10-CM | POA: Diagnosis not present

## 2023-07-03 DIAGNOSIS — D869 Sarcoidosis, unspecified: Secondary | ICD-10-CM | POA: Diagnosis present

## 2023-07-03 DIAGNOSIS — Z823 Family history of stroke: Secondary | ICD-10-CM | POA: Diagnosis not present

## 2023-07-03 DIAGNOSIS — Z833 Family history of diabetes mellitus: Secondary | ICD-10-CM | POA: Diagnosis not present

## 2023-07-03 DIAGNOSIS — R4182 Altered mental status, unspecified: Secondary | ICD-10-CM | POA: Diagnosis not present

## 2023-07-03 DIAGNOSIS — M1990 Unspecified osteoarthritis, unspecified site: Secondary | ICD-10-CM | POA: Diagnosis present

## 2023-07-03 DIAGNOSIS — D469 Myelodysplastic syndrome, unspecified: Secondary | ICD-10-CM | POA: Diagnosis present

## 2023-07-03 DIAGNOSIS — Z881 Allergy status to other antibiotic agents status: Secondary | ICD-10-CM | POA: Diagnosis not present

## 2023-07-03 DIAGNOSIS — D649 Anemia, unspecified: Secondary | ICD-10-CM

## 2023-07-03 DIAGNOSIS — I951 Orthostatic hypotension: Secondary | ICD-10-CM | POA: Diagnosis not present

## 2023-07-03 DIAGNOSIS — Z8249 Family history of ischemic heart disease and other diseases of the circulatory system: Secondary | ICD-10-CM | POA: Diagnosis not present

## 2023-07-03 DIAGNOSIS — N400 Enlarged prostate without lower urinary tract symptoms: Secondary | ICD-10-CM | POA: Diagnosis present

## 2023-07-03 DIAGNOSIS — E871 Hypo-osmolality and hyponatremia: Secondary | ICD-10-CM | POA: Diagnosis present

## 2023-07-03 DIAGNOSIS — R609 Edema, unspecified: Secondary | ICD-10-CM | POA: Diagnosis not present

## 2023-07-03 DIAGNOSIS — D6959 Other secondary thrombocytopenia: Secondary | ICD-10-CM | POA: Diagnosis present

## 2023-07-03 DIAGNOSIS — T82838A Hemorrhage of vascular prosthetic devices, implants and grafts, initial encounter: Secondary | ICD-10-CM | POA: Diagnosis present

## 2023-07-03 DIAGNOSIS — R58 Hemorrhage, not elsewhere classified: Secondary | ICD-10-CM | POA: Diagnosis present

## 2023-07-03 DIAGNOSIS — D62 Acute posthemorrhagic anemia: Secondary | ICD-10-CM | POA: Diagnosis not present

## 2023-07-03 DIAGNOSIS — Y838 Other surgical procedures as the cause of abnormal reaction of the patient, or of later complication, without mention of misadventure at the time of the procedure: Secondary | ICD-10-CM | POA: Diagnosis present

## 2023-07-03 DIAGNOSIS — D6189 Other specified aplastic anemias and other bone marrow failure syndromes: Secondary | ICD-10-CM | POA: Diagnosis not present

## 2023-07-03 LAB — CBC
HCT: 20.7 % — ABNORMAL LOW (ref 39.0–52.0)
HCT: 25.2 % — ABNORMAL LOW (ref 39.0–52.0)
Hemoglobin: 6.8 g/dL — ABNORMAL LOW (ref 13.0–17.0)
Hemoglobin: 8.4 g/dL — ABNORMAL LOW (ref 13.0–17.0)
MCH: 35.1 pg — ABNORMAL HIGH (ref 26.0–34.0)
MCH: 34.7 pg — ABNORMAL HIGH (ref 26.0–34.0)
MCHC: 32.9 g/dL (ref 30.0–36.0)
MCHC: 33.3 g/dL (ref 30.0–36.0)
MCV: 106.7 fL — ABNORMAL HIGH (ref 80.0–100.0)
MCV: 104.1 fL — ABNORMAL HIGH (ref 80.0–100.0)
Platelets: 12 10*3/uL — CL (ref 150–400)
Platelets: 49 10*3/uL — ABNORMAL LOW (ref 150–400)
RBC: 1.94 MIL/uL — ABNORMAL LOW (ref 4.22–5.81)
RBC: 2.42 MIL/uL — ABNORMAL LOW (ref 4.22–5.81)
RDW: 17.2 % — ABNORMAL HIGH (ref 11.5–15.5)
RDW: 19.1 % — ABNORMAL HIGH (ref 11.5–15.5)
WBC: 7.2 10*3/uL (ref 4.0–10.5)
WBC: 6.7 10*3/uL (ref 4.0–10.5)
nRBC: 0 % (ref 0.0–0.2)
nRBC: 0.3 % — ABNORMAL HIGH (ref 0.0–0.2)

## 2023-07-03 LAB — BLOOD GAS, VENOUS
Acid-Base Excess: 1.9 mmol/L (ref 0.0–2.0)
Bicarbonate: 26.6 mmol/L (ref 20.0–28.0)
O2 Saturation: 87.3 %
Patient temperature: 37
pCO2, Ven: 41 mmHg — ABNORMAL LOW (ref 44–60)
pH, Ven: 7.42 (ref 7.25–7.43)
pO2, Ven: 58 mmHg — ABNORMAL HIGH (ref 32–45)

## 2023-07-03 LAB — BASIC METABOLIC PANEL
Anion gap: 6 (ref 5–15)
BUN: 18 mg/dL (ref 8–23)
CO2: 25 mmol/L (ref 22–32)
Calcium: 8.6 mg/dL — ABNORMAL LOW (ref 8.9–10.3)
Chloride: 105 mmol/L (ref 98–111)
Creatinine, Ser: 0.76 mg/dL (ref 0.61–1.24)
GFR, Estimated: >60 mL/min (ref >60)
Glucose, Bld: 94 mg/dL (ref 70–99)
Potassium: 3.9 mmol/L (ref 3.5–5.1)
Sodium: 136 mmol/L (ref 135–145)

## 2023-07-03 LAB — CBC WITH DIFFERENTIAL/PLATELET
Abs Immature Granulocytes: 0.31 10*3/uL — ABNORMAL HIGH (ref 0.00–0.07)
Basophils Absolute: 0 10*3/uL (ref 0.0–0.1)
Basophils Relative: 0 %
Eosinophils Absolute: 0 10*3/uL (ref 0.0–0.5)
Eosinophils Relative: 0 %
HCT: 23.1 % — ABNORMAL LOW (ref 39.0–52.0)
Hemoglobin: 7.4 g/dL — ABNORMAL LOW (ref 13.0–17.0)
Immature Granulocytes: 4 %
Lymphocytes Relative: 29 %
Lymphs Abs: 2.6 10*3/uL (ref 0.7–4.0)
MCH: 35.1 pg — ABNORMAL HIGH (ref 26.0–34.0)
MCHC: 32 g/dL (ref 30.0–36.0)
MCV: 109.5 fL — ABNORMAL HIGH (ref 80.0–100.0)
Monocytes Absolute: 0.6 10*3/uL (ref 0.1–1.0)
Monocytes Relative: 7 %
Neutro Abs: 5.4 10*3/uL (ref 1.7–7.7)
Neutrophils Relative %: 60 %
Platelets: 12 10*3/uL — CL (ref 150–400)
RBC: 2.11 MIL/uL — ABNORMAL LOW (ref 4.22–5.81)
RDW: 17.1 % — ABNORMAL HIGH (ref 11.5–15.5)
Smear Review: DECREASED
WBC: 9 10*3/uL (ref 4.0–10.5)
nRBC: 0.2 % (ref 0.0–0.2)

## 2023-07-03 LAB — COMPREHENSIVE METABOLIC PANEL
ALT: 10 U/L (ref 0–44)
AST: 14 U/L — ABNORMAL LOW (ref 15–41)
Albumin: 3.6 g/dL (ref 3.5–5.0)
Alkaline Phosphatase: 51 U/L (ref 38–126)
Anion gap: 5 (ref 5–15)
BUN: 18 mg/dL (ref 8–23)
CO2: 25 mmol/L (ref 22–32)
Calcium: 8.7 mg/dL — ABNORMAL LOW (ref 8.9–10.3)
Chloride: 103 mmol/L (ref 98–111)
Creatinine, Ser: 0.82 mg/dL (ref 0.61–1.24)
GFR, Estimated: >60 mL/min (ref >60)
Glucose, Bld: 110 mg/dL — ABNORMAL HIGH (ref 70–99)
Potassium: 4.1 mmol/L (ref 3.5–5.1)
Sodium: 133 mmol/L — ABNORMAL LOW (ref 135–145)
Total Bilirubin: 1.2 mg/dL (ref 0.0–1.2)
Total Protein: 6.8 g/dL (ref 6.5–8.1)

## 2023-07-03 LAB — PROTIME-INR
INR: 1.2 (ref 0.8–1.2)
Prothrombin Time: 15.7 s — ABNORMAL HIGH (ref 11.4–15.2)

## 2023-07-03 LAB — MRSA NEXT GEN BY PCR, NASAL: MRSA by PCR Next Gen: NOT DETECTED

## 2023-07-03 LAB — ABO/RH: ABO/RH(D): O POS

## 2023-07-03 LAB — PREPARE RBC (CROSSMATCH)

## 2023-07-03 LAB — GLUCOSE, CAPILLARY: Glucose-Capillary: 116 mg/dL — ABNORMAL HIGH (ref 70–99)

## 2023-07-03 MED ORDER — ONDANSETRON HCL 4 MG/2ML IJ SOLN
4.0000 mg | Freq: Four times a day (QID) | INTRAMUSCULAR | Status: DC | PRN
Start: 2023-07-03 — End: 2023-07-08

## 2023-07-03 MED ORDER — TRAZODONE HCL 50 MG PO TABS: 25.0000 mg | ORAL_TABLET | Freq: Every evening | ORAL | Status: DC | PRN

## 2023-07-03 MED ORDER — SODIUM CHLORIDE 0.9 % IV SOLN: Freq: Once | INTRAVENOUS | Status: AC

## 2023-07-03 MED ORDER — ACETAMINOPHEN 325 MG PO TABS: 650.0000 mg | ORAL_TABLET | Freq: Four times a day (QID) | ORAL | Status: DC | PRN

## 2023-07-03 MED ORDER — ACETAMINOPHEN 325 MG PO TABS
650.0000 mg | ORAL_TABLET | Freq: Once | ORAL | Status: AC
  Administered 2023-07-03: 650 mg via ORAL
  Filled 2023-07-03: qty 2

## 2023-07-03 MED ORDER — FAMOTIDINE IN NACL 20-0.9 MG/50ML-% IV SOLN
20.0000 mg | Freq: Two times a day (BID) | INTRAVENOUS | Status: DC
  Administered 2023-07-03 – 2023-07-04 (×3): 20 mg via INTRAVENOUS
  Filled 2023-07-03 (×3): qty 50

## 2023-07-03 MED ORDER — METHYLPREDNISOLONE SODIUM SUCC 125 MG IJ SOLR: 125.0000 mg | INTRAMUSCULAR | Status: AC

## 2023-07-03 MED ORDER — DIPHENHYDRAMINE HCL 50 MG/ML IJ SOLN
INTRAMUSCULAR | Status: AC
Start: 2023-07-03 — End: 2023-07-03
  Administered 2023-07-03: 50 mg via INTRAVENOUS
  Filled 2023-07-03: qty 1

## 2023-07-03 MED ORDER — LIDOCAINE-EPINEPHRINE (PF) 2 %-1:200000 IJ SOLN
INTRAMUSCULAR | Status: AC
  Administered 2023-07-03: 20 mL
  Filled 2023-07-03: qty 20

## 2023-07-03 MED ORDER — ONDANSETRON HCL 4 MG PO TABS: 4.0000 mg | ORAL_TABLET | Freq: Four times a day (QID) | ORAL | Status: DC | PRN

## 2023-07-03 MED ORDER — DIPHENHYDRAMINE HCL 50 MG/ML IJ SOLN: 50.0000 mg | INTRAMUSCULAR | Status: AC

## 2023-07-03 MED ORDER — DIPHENHYDRAMINE HCL 25 MG PO CAPS
25.0000 mg | ORAL_CAPSULE | Freq: Once | ORAL | Status: AC
  Administered 2023-07-03: 25 mg via ORAL
  Filled 2023-07-03: qty 1

## 2023-07-03 MED ORDER — SODIUM CHLORIDE 0.9 % IV SOLN: Freq: Once | INTRAVENOUS | Status: DC

## 2023-07-03 MED ORDER — MAGNESIUM HYDROXIDE 400 MG/5ML PO SUSP: 30.0000 mL | Freq: Every day | ORAL | Status: DC | PRN

## 2023-07-03 MED ORDER — ORAL CARE MOUTH RINSE: 15.0000 mL | OROMUCOSAL | Status: DC | PRN

## 2023-07-03 MED ORDER — ACETAMINOPHEN 650 MG RE SUPP: 650.0000 mg | Freq: Four times a day (QID) | RECTAL | Status: DC | PRN

## 2023-07-03 MED ORDER — TRANEXAMIC ACID FOR EPISTAXIS
500.0000 mg | Freq: Once | TOPICAL | Status: AC
  Administered 2023-07-03: 500 mg via TOPICAL
  Filled 2023-07-03: qty 5
  Filled 2023-07-03: qty 10

## 2023-07-03 MED ORDER — DIPHENHYDRAMINE HCL 50 MG/ML IJ SOLN
25.0000 mg | Freq: Four times a day (QID) | INTRAMUSCULAR | Status: DC
  Administered 2023-07-03 – 2023-07-06 (×7): 25 mg via INTRAVENOUS
  Filled 2023-07-03 (×10): qty 1

## 2023-07-03 MED ORDER — CHLORHEXIDINE GLUCONATE CLOTH 2 % EX PADS
6.0000 | MEDICATED_PAD | Freq: Every day | CUTANEOUS | Status: DC
Start: 2023-07-03 — End: 2023-07-05
  Administered 2023-07-03 – 2023-07-05 (×3): 6 via TOPICAL

## 2023-07-03 MED ORDER — SODIUM CHLORIDE 0.9 % IV SOLN
10.0000 mL/h | Freq: Once | INTRAVENOUS | Status: AC
  Administered 2023-07-03: 10 mL/h via INTRAVENOUS

## 2023-07-03 MED ORDER — SODIUM CHLORIDE 0.9 % IV SOLN: INTRAVENOUS | Status: DC

## 2023-07-03 MED ORDER — METHYLPREDNISOLONE SODIUM SUCC 125 MG IJ SOLR
60.0000 mg | Freq: Four times a day (QID) | INTRAMUSCULAR | Status: DC
  Administered 2023-07-03 – 2023-07-05 (×8): 60 mg via INTRAVENOUS
  Filled 2023-07-03 (×8): qty 2

## 2023-07-03 MED ORDER — METHYLPREDNISOLONE SODIUM SUCC 125 MG IJ SOLR
INTRAMUSCULAR | Status: AC
  Administered 2023-07-03: 125 mg via INTRAVENOUS
  Filled 2023-07-03: qty 2

## 2023-07-03 NOTE — ED Provider Notes (Signed)
 [facility] Provider Note    Event Date/Time   First MD Initiated Contact with Patient 07/02/23 2353     (approximate)   History   Vascular Access Problem   HPI  Colin Barron is a 78 y.o. male with history of MDS currently being treated at [facility] who presents to the emergency department with bleeding from his port.  He had a port placed by IR at [facility] on 06/28/2023.  States that a nurse came out and changed his dressing 2 days later.  Brother reports tonight that they noticed that the dressing was saturated with blood but no bleeding leaking from outside of the dressing.  They have also noticed some bruising to the right lateral ribs.  He denies any pain, fever.  His port has not been accessed.  No falls or injury.  He states he was not aware that he should not be lifting anything over 10 pounds.  Brother states he has been relatively active since the port was placed.  Patient last received irradiated platelet transfusion just prior to port placement due to platelet count of 26,000.  Patient not on antiplatelets or anticoagulants.   History provided by patient, brother.    Past Medical History:  Diagnosis Date   Arthritis    BPH (benign prostatic hyperplasia) 12/08/2016    Past Surgical History:  Procedure Laterality Date   BIOPSY PROSTATE     CHOLECYSTECTOMY     CYST REMOVAL NECK     TONSILLECTOMY      MEDICATIONS:  Prior to Admission medications   Medication Sig Start Date End Date Taking? Authorizing Provider  aspirin EC 81 MG EC tablet Take 1 tablet (81 mg total) by mouth daily. Swallow whole. Patient not taking: Reported on 04/25/2023 02/09/21   Zannie Cove, MD  b complex vitamins capsule Take 1 capsule by mouth daily. Patient not taking: Reported on 04/25/2023    [provider]  Cholecalciferol (VITAMIN D3 PO) Take 1 tablet by mouth every other day. Patient not taking: Reported on 04/25/2023    [provider]   Polyethyl Glycol-Propyl Glycol (SYSTANE) 0.4-0.3 % SOLN Apply to eye daily.    [provider]    Physical Exam   Triage Vital Signs: ED Triage Vitals  Encounter Vitals Group     BP 07/02/23 2350 125/73     Systolic BP Percentile --      Diastolic BP Percentile --      Pulse Rate 07/02/23 2350 80     Resp 07/02/23 2350 18     Temp 07/02/23 2350 98.5 F (36.9 C)     Temp Source 07/02/23 2350 Oral     SpO2 07/02/23 2350 98 %     Weight --      Height 07/02/23 2348 5\' 11"  (1.803 m)     Head Circumference --      Peak Flow --      Pain Score 07/02/23 2348 0     Pain Loc --      Pain Education --      Exclude from Growth Chart --     Most recent vital signs: Vitals:   07/03/23 0130 07/03/23 0330  BP: 108/62 (!) 101/58  Pulse: 79 78  Resp:    Temp:    SpO2: 100% 100%    CONSTITUTIONAL: Alert, responds appropriately to questions.  Elderly, thin, in no distress HEAD: Normocephalic, atraumatic EYES: Conjunctivae clear, pupils appear equal, sclera nonicteric ENT: normal nose;  moist mucous membranes NECK: Supple, normal ROM CARD: RRR; S1 and S2 appreciated CHEST: Patient has a port located in the right chest that has mild soft tissue swelling, bruising without increased warmth.  There is a small hematoma to the most medial aspect of the incision site.  It is oozing a small amount of blood from the wound.  There is dependent bruising noted to the right chest wall but no bony abnormality or significant tenderness. RESP: Normal chest excursion without splinting or tachypnea; breath sounds clear and equal bilaterally; no wheezes, no rhonchi, no rales, no hypoxia or respiratory distress, speaking full sentences ABD/GI: Non-distended; soft, non-tender, no rebound, no guarding, no peritoneal signs BACK: The back appears normal EXT: Normal ROM in all joints; no deformity noted, no edema SKIN: Normal color for age and race; warm; no rash on exposed skin NEURO: Moves all  extremities equally, normal speech PSYCH: The patient's mood and manner are appropriate.     Patient gave verbal permission to utilize photo for medical documentation only. The image was not stored on any personal device.   ED Results / Procedures / Treatments   LABS: (all labs ordered are listed, but only abnormal results are displayed) Labs Reviewed  CBC WITH DIFFERENTIAL/PLATELET - Abnormal; Notable for the following components:      Result Value   RBC 2.11 (*)    Hemoglobin 7.4 (*)    HCT 23.1 (*)    MCV 109.5 (*)    MCH 35.1 (*)    RDW 17.1 (*)    Platelets 12 (*)    Abs Immature Granulocytes 0.31 (*)    All other components within normal limits  COMPREHENSIVE METABOLIC PANEL - Abnormal; Notable for the following components:   Sodium 133 (*)    Glucose, Bld 110 (*)    Calcium 8.7 (*)    AST 14 (*)    All other components within normal limits  PROTIME-INR - Abnormal; Notable for the following components:   Prothrombin Time 15.7 (*)    All other components within normal limits  TYPE AND SCREEN  PREPARE RBC (CROSSMATCH)  PREPARE PLATELET PHERESIS  ABO/RH     EKG:  RADIOLOGY: My personal review and interpretation of imaging: X-ray shows normal port placement.  I have personally reviewed all radiology reports.   DG Chest Portable 1 View Result Date: 07/03/2023 CLINICAL DATA:  Bleeding at the port site. Recently placed right chest port. EXAM: PORTABLE CHEST 1 VIEW COMPARISON:  Chest CT without contrast 11/26/2021 FINDINGS: Cardiac size is normal. There is tortuosity, scattered calcific plaque with stable mediastinum. The lungs are hypoinflated but generally clear. No pneumothorax or pleural effusion is seen. There is new demonstration of a right chest port with IJ approach catheter terminating at the level of the superior cavoatrial junction. There is no visible break in the catheter or disconnection from the port. Thoracic cage is intact. IMPRESSION: 1. No acute  radiographic chest findings. 2. Right chest port with IJ approach catheter terminating at the level of the superior cavoatrial junction. No visible break in the catheter or disconnection from the port. Electronically Signed   By: Almira Bar M.D.   On: 07/03/2023 01:30     PROCEDURES:  Critical Care performed: Yes, see critical care procedure note(s)   CRITICAL CARE Performed by: Rochele Raring   Total critical care time: 35 minutes  Critical care time was exclusive of separately billable procedures and treating other patients.  Critical care was necessary to treat or  prevent imminent or life-threatening deterioration.  Critical care was time spent personally by me on the following activities: development of treatment plan with patient and/or surrogate as well as nursing, discussions with consultants, evaluation of patient's response to treatment, examination of patient, obtaining history from patient or surrogate, ordering and performing treatments and interventions, ordering and review of laboratory studies, ordering and review of radiographic studies, pulse oximetry and re-evaluation of patient's condition.   Marland Kitchen1-3 Lead EKG Interpretation  Performed by: Crespin Forstrom, Layla Maw, DO Authorized by: Chioke Noxon, Layla Maw, DO     Interpretation: normal     ECG rate:  79   ECG rate assessment: normal     Rhythm: sinus rhythm     Ectopy: none     Conduction: normal       IMPRESSION / MDM / ASSESSMENT AND PLAN / ED COURSE  I reviewed the triage vital signs and the nursing notes.    Patient here with bleeding from his port.  History of MDS with anemia and thrombocytopenia.  The patient is on the cardiac monitor to evaluate for evidence of arrhythmia and/or significant heart rate changes.   DIFFERENTIAL DIAGNOSIS (includes but not limited to):   Bleeding from port, hematoma, thrombocytopenia, coagulopathy, anemia, no sign of infection   Patient's presentation is most consistent with acute  presentation with potential threat to life or bodily function.   PLAN: Will check labs today including CBC, CMP, INR.  Will obtain chest x-ray as well to evaluate his port further.  After I remove the Steri-Strips it does look like he has a small area of wound dehiscence and a hematoma.  We cleaned the area several times and then injected lidocaine with epinephrine into the subcutaneous tissue.  This caused the hematoma to rupture and decompress and bleeding stopped quickly after that.  We placed TXA soaked gauze over the area and wrapped with a pressure bandage.  Will continue to monitor closely.  No sign of infection on exam today.  He is hemodynamically stable.   MEDICATIONS GIVEN IN ED: Medications  0.9 %  sodium chloride infusion (has no administration in time range)  0.9 %  sodium chloride infusion (has no administration in time range)  tranexamic acid (CYKLOKAPRON) 1000 MG/10ML topical solution 500 mg (500 mg Topical Given 07/03/23 0040)  lidocaine-EPINEPHrine (XYLOCAINE W/EPI) 2 %-1:200000 (PF) injection (20 mLs  Given 07/03/23 0040)     ED COURSE: Platelet count of 12,000.  Patient will need transfusion given he is bleeding.  Also hemoglobin of 7.4.  Was 9.5 on 06/30/2023 at [facility].  He has no other signs of bleeding but still has some intermittent oozing from the incision site from his port.  Will transfuse 1 unit of packed red blood cells as well.  Will discuss with hospitalist for admission here for observation.   CONSULTS:  Consulted and discussed patient's case with hospitalist, Dr. Arville Care.  I have recommended admission and consulting physician agrees and will place admission orders.  Patient (and family if present) agree with this plan.   I reviewed all nursing notes, vitals, pertinent previous records.  All labs, EKGs, imaging ordered have been independently reviewed and interpreted by myself.    OUTSIDE RECORDS REVIEWED: Reviewed recent oncology notes and IR notes at  [facility]       FINAL CLINICAL IMPRESSION(S) / ED DIAGNOSES   Final diagnoses:  Bleeding  Vascular port complication, initial encounter  Anemia, unspecified type  Thrombocytopenia (HCC)     Rx / DC Orders  ED Discharge Orders     None        Note:  This document was prepared using Dragon voice recognition software and may include unintentional dictation errors.   Roland Lipke, Layla Maw, DO 07/03/23 [phone]

## 2023-07-03 NOTE — H&P (Signed)
 [place]   PATIENT NAME: Colin Barron    MR#:  [id number]  DATE OF BIRTH:  [birth year]  DATE OF ADMISSION:  07/02/2023  PRIMARY CARE PHYSICIAN: Tally Joe, MD   Patient is coming from: Home  REQUESTING/REFERRING PHYSICIAN: Ward, Layla Maw, DO  CHIEF COMPLAINT:   Chief Complaint  Patient presents with   Vascular Access Problem    HISTORY OF PRESENT ILLNESS:  Colin Barron is a 78 y.o. African-American male with medical history significant for MDS, currently being treated at [facility], osteoarthritis and BPH, who presented to the emergency room with acute onset of bleeding from his newly inserted port.  He had this inserted by IR at [facility] on 3//2025.  He stated that a nurse came out and change his dressings a couple of days ago.  His brother reported denied that they noticed the dressing was saturated with blood with no bleeding from outside the dressing.  He was noted to have some bruising to the right lateral ribs.  No nausea or vomiting or abdominal pain.  No fever or chills.  No cough or wheezing or hemoptysis.  No dysuria, oliguria or hematuria or flank pain.  No other bleeding diathesis.  He denies any recent falls or trauma.  He last received irrigated platelets transfusion just prior to port placement due to thrombocytopenia of 20 sticks.  He has not been on any blood thinners.  ED Course: When he came to the ER, vital signs were within normal.  Labs revealed mild hyponatremia calcium of 8.7 with otherwise unremarkable CMP.  CBC showed anemia with hemoglobin 7.4 and hematocrit 23.1 compared to 9.9 and 30.5 on 05/26/2023 with platelets of 12 compared to 18 then.  INR is 1.2 and PT 15.7.  Patient was typed and crossmatch. EKG as reviewed by me : None Imaging: Portable chest x-ray showed no acute cardiopulmonary disease.  It showed right chest port with IJ approach catheter terminating at the level of the superior cavoatrial junction with no visible break in the catheter or  disconnection from the port.  The patient was given 500 mg of topical tranexamic acid as well as IV lidocaine with epinephrine with good hemostasis.  The patient will be admitted to an observation progressive unit bed for further evaluation and management. PAST MEDICAL HISTORY:   Past Medical History:  Diagnosis Date   Arthritis    BPH (benign prostatic hyperplasia) 12/08/2016    PAST SURGICAL HISTORY:   Past Surgical History:  Procedure Laterality Date   BIOPSY PROSTATE     CHOLECYSTECTOMY     CYST REMOVAL NECK     TONSILLECTOMY      SOCIAL HISTORY:   Social History   Tobacco Use   Smoking status: Never   Smokeless tobacco: Never  Substance Use Topics   Alcohol use: No    FAMILY HISTORY:   Family History  Problem Relation Age of Onset   Diabetes Mother    Hypertension Mother    Intracerebral hemorrhage Brother    Hypertension Brother    Stroke Maternal Grandmother    Stroke Maternal Grandfather    Hypertension Brother        POSS    DRUG ALLERGIES:   Allergies  Allergen Reactions   Amoxicillin Other (See Comments)    Tolerated Rocephin IV on 04/25/2021      REVIEW OF SYSTEMS:   ROS As per history of present illness. All pertinent systems were reviewed above. Constitutional, HEENT, cardiovascular, respiratory, GI,  GU, musculoskeletal, neuro, psychiatric, endocrine, integumentary and hematologic systems were reviewed and are otherwise negative/unremarkable except for positive findings mentioned above in the HPI.   MEDICATIONS AT HOME:   Prior to Admission medications   Medication Sig Start Date End Date Taking? Authorizing Provider  aspirin EC 81 MG EC tablet Take 1 tablet (81 mg total) by mouth daily. Swallow whole. Patient not taking: Reported on 04/25/2023 02/09/21   Zannie Cove, MD  b complex vitamins capsule Take 1 capsule by mouth daily. Patient not taking: Reported on 04/25/2023    [provider]  Cholecalciferol (VITAMIN D3  PO) Take 1 tablet by mouth every other day. Patient not taking: Reported on 04/25/2023    [provider]  Polyethyl Glycol-Propyl Glycol (SYSTANE) 0.4-0.3 % SOLN Apply to eye daily.    [provider]      VITAL SIGNS:  Blood pressure (!) 101/58, pulse 78, temperature 98.5 F (36.9 C), temperature source Oral, resp. rate 18, height 5\' 11"  (1.803 m), SpO2 100%.  PHYSICAL EXAMINATION:  Physical Exam  GENERAL:  78 y.o.-year-old African-American patient lying in the bed with no acute distress.  EYES: Pupils equal, round, reactive to light and accommodation. No scleral icterus. Extraocular muscles intact.  HEENT: Head atraumatic, normocephalic. Oropharynx and nasopharynx clear.  NECK:  Supple, no jugular venous distention. No thyroid enlargement, no tenderness.  LUNGS: Normal breath sounds bilaterally, no wheezing, rales,rhonchi or crepitation. No use of accessory muscles of respiration.  CARDIOVASCULAR: Regular rate and rhythm, S1, S2 normal. No murmurs, rubs, or gallops.  ABDOMEN: Soft, nondistended, nontender. Bowel sounds present. No organomegaly or mass.  EXTREMITIES: No pedal edema, cyanosis, or clubbing.  NEUROLOGIC: Cranial nerves II through XII are intact. Muscle strength 5/5 in all extremities. Sensation intact. Gait not checked.  PSYCHIATRIC: The patient is alert and oriented x 3.  Normal affect and good eye contact. SKIN: No obvious rash, lesion, or ulcer.   LABORATORY PANEL:   CBC Recent Labs  Lab 07/03/23 0039  WBC 9.0  HGB 7.4*  HCT 23.1*  PLT 12*   ------------------------------------------------------------------------------------------------------------------  Chemistries  Recent Labs  Lab 07/03/23 0039  NA 133*  K 4.1  CL 103  CO2 25  GLUCOSE 110*  BUN 18  CREATININE 0.82  CALCIUM 8.7*  AST 14*  ALT 10  ALKPHOS 51  BILITOT 1.2    ------------------------------------------------------------------------------------------------------------------  Cardiac Enzymes No results for input(s): "TROPONINI" in the last 168 hours. ------------------------------------------------------------------------------------------------------------------  RADIOLOGY:  DG Chest Portable 1 View Result Date: 07/03/2023 CLINICAL DATA:  Bleeding at the port site. Recently placed right chest port. EXAM: PORTABLE CHEST 1 VIEW COMPARISON:  Chest CT without contrast 11/26/2021 FINDINGS: Cardiac size is normal. There is tortuosity, scattered calcific plaque with stable mediastinum. The lungs are hypoinflated but generally clear. No pneumothorax or pleural effusion is seen. There is new demonstration of a right chest port with IJ approach catheter terminating at the level of the superior cavoatrial junction. There is no visible break in the catheter or disconnection from the port. Thoracic cage is intact. IMPRESSION: 1. No acute radiographic chest findings. 2. Right chest port with IJ approach catheter terminating at the level of the superior cavoatrial junction. No visible break in the catheter or disconnection from the port. Electronically Signed   By: Almira Bar M.D.   On: 07/03/2023 01:30      IMPRESSION AND PLAN:  Assessment and Plan: * Severe thrombocytopenia (HCC) - This is associated with myelodysplastic syndrome. - The patient will  be admitted to a progressive unit bed. - He will be transfused 2 units of irradiated platelets. - Hematology consult will be obtained. - I notified Dr. Donneta Romberg about the patient.  Acute on chronic anemia - She was typed and crossmatch and will be transfused 1 unit of packed red blood cells given associated bleeding with hemoglobin less than 8. - We will follow posttransfusion H&H.  Gout - We will continue allopurinol.   DVT prophylaxis: SCDs. Advanced Care Planning:  Code Status: full code. Family  Communication:  The plan of care was discussed in details with the patient (and family). I answered all questions. The patient agreed to proceed with the above mentioned plan. Further management will depend upon hospital course. Disposition Plan: Back to previous home environment Consults called: Hematology All the records are reviewed and case discussed with ED provider.  Status is: Observation  I certify that at the time of admission, it is my clinical judgment that the patient will require  hospital care extending less than 2 midnights.                            Dispo: The patient is from: Home              Anticipated d/c is to: Home              Patient currently is not medically stable to d/c.              Difficult to place patient: No  Hannah Beat M.D on 07/03/2023 at 5:00 AM  [facility]   From 7 PM-7 AM, contact night-coverage [email or web address]  CC: Primary care physician; Tally Joe, MD

## 2023-07-03 NOTE — Assessment & Plan Note (Signed)
-   She was typed and crossmatch and will be transfused 1 unit of packed red blood cells given associated bleeding with hemoglobin less than 8. - We will follow posttransfusion H&H.

## 2023-07-03 NOTE — Progress Notes (Signed)
 Progress Note    Colin Barron  LOV:[id number] DOB: 1945-09-24  DOA: 07/02/2023 PCP: Tally Joe, MD      Brief Narrative:    Medical records reviewed and are as summarized below:  Colin Barron is a 78 y.o. male with medical history significant for MDS, currently being treated at [facility], osteoarthritis and BPH, who presented to the emergency room with acute onset of bleeding from his newly inserted port.  He had this inserted by IR at [facility] on 06/30/2023.  He stated that a nurse came out and change his dressings a couple of days ago.  Family noticed that the dressing was saturated with blood.  He had received blood transfusion prior to Port-A-Cath placement.  His hemoglobin was 7.4 on admission and it dropped to 6.8.  Please count was 12,000.   Assessment/Plan:   Principal Problem:   Severe thrombocytopenia (HCC) Active Problems:   Acute on chronic anemia   Gout   Body mass index is 23.15 kg/m.   Severe thrombocytopenia, severe anemia, acute blood loss anemia from bleeding at Port-A-Cath site, myelodysplastic syndrome: Hemoglobin was down to 6.8 and platelet count 212.  Hemoglobin was 9.5 and platelet was 45 on 06/30/2023. Patient will be transfused with 2 units of packed red blood cells and 2 units of platelets.  Monitor CBC and transfuse as needed. Dr. Donneta Romberg, oncologist, has been consulted to assist with management   Angioedema of the upper lip: Improved.  He was treated with IV Solu-Medrol, IV Benadryl. Patient said the only new medicine he had taken recently was  Inqovi prescribed by his oncologist for MDS.  He is not aware of taking any other new medicines.   General weakness and dizziness: Likely from severe anemia.  Consult PT when hemoglobin and platelet counts have stabilized.   Comorbidities include gout, BPH, osteoarthritis  Diet Order             Diet Heart Room service appropriate? Yes; Fluid consistency: Thin  Diet effective now                             Consultants: Oncologist  Procedures: None    Medications:    Chlorhexidine Gluconate Cloth  6 each Topical Q0600   diphenhydrAMINE  25 mg Intravenous Q6H   methylPREDNISolone (SOLU-MEDROL) injection  60 mg Intravenous Q6H   Continuous Infusions:  sodium chloride     sodium chloride     famotidine (PEPCID) IV       Anti-infectives (From admission, onward)    None              Family Communication/Anticipated D/C date and plan/Code Status   DVT prophylaxis: SCDs Start: 07/03/23 0431     Code Status: Full Code  Family Communication: Plan discussed with Caryn Bee, son, and his daughter-in-law at the bedside. Disposition Plan: Plan to discharge home   Status is: Inpatient Remains inpatient appropriate because: Severe anemia and thrombocytopenia       Subjective:   Interval events noted.  He complains of weakness and dizziness.  He said upper lip swelling is better but he thinks the swelling has moved from the left to the right.  He said he took [place] recently for MDS.  His son and daughter-in-law were at the bedside.  Rolly Salter, RN, was also at the bedside.  Objective:    Vitals:   07/03/23 1100 07/03/23 1101 07/03/23 1116 07/03/23 1200  BP:  119/70  114/66 127/72  Pulse: 70  68 72  Resp: 16  12 16   Temp:  98.1 F (36.7 C) 98.2 F (36.8 C)   TempSrc:  Oral    SpO2: 100%   100%  Weight:      Height:       No data found.   Intake/Output Summary (Last 24 hours) at 07/03/2023 1249 Last data filed at 07/03/2023 1200 Gross per 24 hour  Intake 755.66 ml  Output 100 ml  Net 655.66 ml   Filed Weights   07/03/23 1043  Weight: 75.3 kg    Exam:  GEN: NAD SKIN: Warm and dry.  No active bleeding from Port-A-Cath on right upper chest.  Dressing on Port-A-Cath in right upper chest is clean, dry and intact. EYES: Pale but anicteric ENT: MMM, right side of upper lip appears slightly swollen.  No tongue or facial swelling.  He  is maintaining his airway. CV: RRR PULM: CTA B ABD: soft, ND, NT, +BS CNS: AAO x 3, non focal EXT: No edema or tenderness        Data Reviewed:   I have personally reviewed following labs and imaging studies:  Labs: Labs show the following:   Basic Metabolic Panel: Recent Labs  Lab 07/03/23 0039 07/03/23 0502  NA 133* 136  K 4.1 3.9  CL 103 105  CO2 25 25  GLUCOSE 110* 94  BUN 18 18  CREATININE 0.82 0.76  CALCIUM 8.7* 8.6*   GFR Estimated Creatinine Clearance: 81.1 mL/min (by C-G formula based on SCr of 0.76 mg/dL). Liver Function Tests: Recent Labs  Lab 07/03/23 0039  AST 14*  ALT 10  ALKPHOS 51  BILITOT 1.2  PROT 6.8  ALBUMIN 3.6   No results for input(s): "LIPASE", "AMYLASE" in the last 168 hours. No results for input(s): "AMMONIA" in the last 168 hours. Coagulation profile Recent Labs  Lab 07/03/23 0039  INR 1.2    CBC: Recent Labs  Lab 07/03/23 0039 07/03/23 0502  WBC 9.0 7.2  NEUTROABS 5.4  --   HGB 7.4* 6.8*  HCT 23.1* 20.7*  MCV 109.5* 106.7*  PLT 12* 12*   Cardiac Enzymes: No results for input(s): "CKTOTAL", "CKMB", "CKMBINDEX", "TROPONINI" in the last 168 hours. BNP (last 3 results) No results for input(s): "PROBNP" in the last 8760 hours. CBG: Recent Labs  Lab 07/03/23 1044  GLUCAP 116*   D-Dimer: No results for input(s): "DDIMER" in the last 72 hours. Hgb A1c: No results for input(s): "HGBA1C" in the last 72 hours. Lipid Profile: No results for input(s): "CHOL", "HDL", "LDLCALC", "TRIG", "CHOLHDL", "LDLDIRECT" in the last 72 hours. Thyroid function studies: No results for input(s): "TSH", "T4TOTAL", "T3FREE", "THYROIDAB" in the last 72 hours.  Invalid input(s): "FREET3" Anemia work up: No results for input(s): "VITAMINB12", "FOLATE", "FERRITIN", "TIBC", "IRON", "RETICCTPCT" in the last 72 hours. Sepsis Labs: Recent Labs  Lab 07/03/23 0039 07/03/23 0502  WBC 9.0 7.2    Microbiology Recent Results (from the  past 240 hours)  MRSA Next Gen by PCR, Nasal     Status: None   Collection Time: 07/03/23 10:46 AM   Specimen: Nasal Mucosa; Nasal Swab  Result Value Ref Range Status   MRSA by PCR Next Gen NOT DETECTED NOT DETECTED Final    Comment: (NOTE) The GeneXpert MRSA Assay (FDA approved for NASAL specimens only), is one component of a comprehensive MRSA colonization surveillance program. It is not intended to diagnose MRSA infection nor to guide or monitor  treatment for MRSA infections. Test performance is not FDA approved in patients less than 30 years old. Performed at [facility], [address] [place], [place] [address]     Procedures and diagnostic studies:  DG Neck Soft Tissue Result Date: 07/03/2023 CLINICAL DATA:  9965 Edema 9965 EXAM: NECK SOFT TISSUES - 1 VIEW COMPARISON:  None Available. FINDINGS: Evaluation is limited by single AP view of the neck. Retropharyngeal soft tissues cannot be assessed on AP view. Atherosclerotic calcifications. Partial visualization of the port. IMPRESSION: Evaluation is limited by single AP view of the neck. Retropharyngeal soft tissues cannot be assessed on AP view. Electronically Signed   By: Meda Klinefelter M.D.   On: 07/03/2023 08:06   DG Chest Port 1 View Result Date: 07/03/2023 CLINICAL DATA:  [id number] Chest pain [id number].  History of sarcoidosis EXAM: PORTABLE CHEST 1 VIEW COMPARISON:  July 03, 2023, April 25, 2021 FINDINGS: The cardiomediastinal silhouette is unchanged in contour with enlarged hilar contours and prominent paratracheal stripe, consistent with history of sarcoidosis.RIGHT chest port with tip terminating over the superior cavoatrial junction. No pleural effusion. No pneumothorax. There are a few scattered streaky bibasilar linear appearing opacities. IMPRESSION: There are few scattered streaky bibasilar linear appearing opacities, favored to reflect atelectasis. Electronically Signed   By: Meda Klinefelter M.D.   On:  07/03/2023 08:05   DG Chest Portable 1 View Result Date: 07/03/2023 CLINICAL DATA:  Bleeding at the port site. Recently placed right chest port. EXAM: PORTABLE CHEST 1 VIEW COMPARISON:  Chest CT without contrast 11/26/2021 FINDINGS: Cardiac size is normal. There is tortuosity, scattered calcific plaque with stable mediastinum. The lungs are hypoinflated but generally clear. No pneumothorax or pleural effusion is seen. There is new demonstration of a right chest port with IJ approach catheter terminating at the level of the superior cavoatrial junction. There is no visible break in the catheter or disconnection from the port. Thoracic cage is intact. IMPRESSION: 1. No acute radiographic chest findings. 2. Right chest port with IJ approach catheter terminating at the level of the superior cavoatrial junction. No visible break in the catheter or disconnection from the port. Electronically Signed   By: Almira Bar M.D.   On: 07/03/2023 01:30               LOS: 0 days   Areyana Leoni  [facility]   Pager on [email or web address]. If 7PM-7AM, please contact night-coverage at [email or web address]     07/03/2023, 12:49 PM

## 2023-07-03 NOTE — ED Notes (Signed)
 This RN assisted pt in changing brief. Pericare provided and new brief applied. Family is at bed side and assisted.

## 2023-07-03 NOTE — Assessment & Plan Note (Signed)
 -  We will continue allopurinol

## 2023-07-03 NOTE — Progress Notes (Signed)
       CROSS COVER NOTE  NAME: Colin Barron MRN: [id number] DOB : 18-Mar-1946 ATTENDING PHYSICIAN: Lurene Shadow, MD    Date of Service   07/03/2023   HPI/Events of Note   Rapid response called Angioedema left upper labia and tongue  Interventions   Assessment/Plan:    07/03/2023    6:00 AM 07/03/2023    5:30 AM 07/03/2023    5:00 AM  Vitals with BMI  Systolic 92 94 107  Diastolic 54 56 62  Pulse 78 74 75   Patient alert able to swallow Pharynx visuallized was satting 97% on room air 100% on 2LNC No stridor Denies ever having similar reaction Only endorsed allergy to amoxicillin  Pressure dressing across chest removed Solumedrol 125 then 60 q6 Benadryl 50 x1 then 25 every 6 pepcid every 12 Admit bed changed to step down Chest and soft tissue neck xrays done - not officially read - no gross abnormalities noted at bedside      Donnie Mesa NP [facility] Cross Cover 7pm-7am - check amion for availability Pager [phone]

## 2023-07-03 NOTE — Assessment & Plan Note (Addendum)
-   This is associated with myelodysplastic syndrome. - The patient will be admitted to a progressive unit bed. - He will be transfused 2 units of irradiated platelets. - Hematology consult will be obtained. - I notified Dr. Donneta Romberg about the patient.

## 2023-07-03 NOTE — Progress Notes (Signed)
 Messaged MD per patient request to make sure communication regarding oncology appt tomorrow at [facility].

## 2023-07-03 NOTE — Plan of Care (Signed)
  Problem: Education: Goal: Knowledge of General Education information will improve Description: Including pain rating scale, medication(s)/side effects and non-pharmacologic comfort measures Outcome: Progressing   Problem: Clinical Measurements: Goal: Ability to maintain clinical measurements within normal limits will improve Outcome: Progressing Goal: Will remain free from infection Outcome: Progressing Goal: Diagnostic test results will improve Outcome: Progressing Goal: Respiratory complications will improve Outcome: Progressing   Problem: Nutrition: Goal: Adequate nutrition will be maintained Outcome: Progressing   Problem: Elimination: Goal: Will not experience complications related to bowel motility Outcome: Progressing

## 2023-07-04 DIAGNOSIS — R58 Hemorrhage, not elsewhere classified: Secondary | ICD-10-CM | POA: Diagnosis not present

## 2023-07-04 DIAGNOSIS — D696 Thrombocytopenia, unspecified: Secondary | ICD-10-CM | POA: Diagnosis not present

## 2023-07-04 DIAGNOSIS — D62 Acute posthemorrhagic anemia: Secondary | ICD-10-CM

## 2023-07-04 DIAGNOSIS — D469 Myelodysplastic syndrome, unspecified: Secondary | ICD-10-CM

## 2023-07-04 DIAGNOSIS — T783XXA Angioneurotic edema, initial encounter: Secondary | ICD-10-CM

## 2023-07-04 LAB — BPAM PLATELET PHERESIS
Blood Product Expiration Date: 202503112359
Blood Product Expiration Date: 202503112359
ISSUE DATE / TIME: 202503090855
ISSUE DATE / TIME: 202503091054
Unit Type and Rh: 5100
Unit Type and Rh: 5100

## 2023-07-04 LAB — PREPARE PLATELET PHERESIS
Unit division: 0
Unit division: 0

## 2023-07-04 LAB — CBC
HCT: 24.2 % — ABNORMAL LOW (ref 39.0–52.0)
Hemoglobin: 8 g/dL — ABNORMAL LOW (ref 13.0–17.0)
MCH: 33.5 pg (ref 26.0–34.0)
MCHC: 33.1 g/dL (ref 30.0–36.0)
MCV: 101.3 fL — ABNORMAL HIGH (ref 80.0–100.0)
Platelets: 42 10*3/uL — ABNORMAL LOW (ref 150–400)
RBC: 2.39 MIL/uL — ABNORMAL LOW (ref 4.22–5.81)
RDW: 19.6 % — ABNORMAL HIGH (ref 11.5–15.5)
WBC: 3.4 10*3/uL — ABNORMAL LOW (ref 4.0–10.5)
nRBC: 0 % (ref 0.0–0.2)

## 2023-07-04 LAB — BASIC METABOLIC PANEL
Anion gap: 7 (ref 5–15)
BUN: 14 mg/dL (ref 8–23)
CO2: 25 mmol/L (ref 22–32)
Calcium: 8.8 mg/dL — ABNORMAL LOW (ref 8.9–10.3)
Chloride: 104 mmol/L (ref 98–111)
Creatinine, Ser: 0.68 mg/dL (ref 0.61–1.24)
GFR, Estimated: >60 mL/min (ref >60)
Glucose, Bld: 138 mg/dL — ABNORMAL HIGH (ref 70–99)
Potassium: 4 mmol/L (ref 3.5–5.1)
Sodium: 136 mmol/L (ref 135–145)

## 2023-07-04 LAB — TYPE AND SCREEN
ABO/RH(D): O POS
Antibody Screen: NEGATIVE
Unit division: 0

## 2023-07-04 LAB — BPAM RBC
Blood Product Expiration Date: 202503192359
ISSUE DATE / TIME: 202503090730
Unit Type and Rh: 9500

## 2023-07-04 MED ORDER — ALLOPURINOL 100 MG PO TABS
300.0000 mg | ORAL_TABLET | Freq: Every day | ORAL | Status: DC
  Administered 2023-07-04 – 2023-07-07 (×3): 300 mg via ORAL
  Filled 2023-07-04 (×2): qty 3
  Filled 2023-07-04: qty 1
  Filled 2023-07-04: qty 3

## 2023-07-04 MED ORDER — VALACYCLOVIR HCL 500 MG PO TABS
500.0000 mg | ORAL_TABLET | Freq: Every day | ORAL | Status: DC
  Administered 2023-07-04 – 2023-07-07 (×3): 500 mg via ORAL
  Filled 2023-07-04 (×4): qty 1

## 2023-07-04 NOTE — Plan of Care (Signed)

## 2023-07-04 NOTE — Progress Notes (Signed)
 Progress Note   Patient: Colin Barron NFA:[id number] DOB: 1946-01-13 DOA: 07/02/2023     1 DOS: the patient was seen and examined on 07/04/2023   Brief hospital course: Colin Barron is a 78 y.o. male with medical history significant for MDS, currently being treated at [facility], osteoarthritis and BPH, who presented to the emergency room with acute onset of bleeding from his newly inserted port.  He had this inserted by IR at [facility] on 06/30/2023.   Family noticed that the dressing was saturated with blood.  He had received blood transfusion prior to Port-A-Cath placement.  Patient's hemoglobin 6.8, platelet count 12 upon presentation, admitted to hospitalist service for further management evaluation of severe thrombocytopenia, acute blood loss anemia due to bleeding from Port-A-Cath site.  Oncology evaluation called.  Assessment and Plan: Severe thrombocytopenia  Severe anemia Acute blood loss anemia from bleeding at Port-A-Cath site, Myelodysplastic syndrome: Hemoglobin improved to 8.0 from 6.8 on presentation and and platelet count 42 from 12.  Patient got 2 units of PRBC. Oncologist evaluation is appreciated.  Advised to continue supportive care with platelets and PRBC transfusion as needed to maintain platelet count above 10, hemoglobin above 7. Patient will continue to follow-up with [facility] oncology team.  Continue home dose Valtrex. Daily CBC ordered.   Angioedema of the upper lip: Improved.  He was treated with IV Solu-Medrol, IV Benadryl. He was raised and started on Inqovi. Advised to let [facility] oncology team know regarding the reaction.  General weakness and dizziness:  In the setting of severe anemia.   PT OT consulted for tomorrow.  Gout -allopurinol restarted.  Other comorbidities BPH, osteoarthritis       Out of bed to chair. Incentive spirometry. Nursing supportive care. Fall, aspiration precautions. DVT prophylaxis   Code Status: Full Code  Subjective: Patient is  seen and examined today morning in ICU.  He is able to answer me slowly and appropriately.  Patient asked about his home Valtrex, allopurinol.  Patient feels little better, ate better as per sons at bedside.  Physical Exam: Vitals:   07/04/23 1300 07/04/23 1400 07/04/23 1500 07/04/23 1600  BP: 116/67 113/62 116/65 106/60  Pulse: 76 68 68 69  Resp: 17 15 13 11   Temp:      TempSrc:      SpO2: 99% 98% 97% 97%  Weight:      Height:        General - Elderly ill African American male, mild respiratory distress HEENT - PERRLA, EOMI, atraumatic head, right port with dressing noted, minimal bleeding. Lung - Clear, basal rales, rhonchi, no wheezes. Heart - S1, S2 heard, no murmurs, rubs, trace pedal edema. Abdomen - Soft, non tender, bowel sounds good Neuro - Alert, awake and oriented x 3, talks slowly, non focal exam. Skin - Warm and dry.  Data Reviewed:      Latest Ref Rng & Units 07/04/2023    3:37 AM 07/03/2023    2:02 PM 07/03/2023    5:02 AM  CBC  WBC 4.0 - 10.5 K/uL 3.4  6.7  7.2   Hemoglobin 13.0 - 17.0 g/dL 8.0  8.4  6.8   Hematocrit 39.0 - 52.0 % 24.2  25.2  20.7   Platelets 150 - 400 K/uL 42  49  12       Latest Ref Rng & Units 07/04/2023    3:37 AM 07/03/2023    5:02 AM 07/03/2023   12:39 AM  BMP  Glucose 70 - 99  mg/dL 621  94  308   BUN 8 - 23 mg/dL 14  18  18    Creatinine 0.61 - 1.24 mg/dL 6.57  8.46  9.62   Sodium 135 - 145 mmol/L 136  136  133   Potassium 3.5 - 5.1 mmol/L 4.0  3.9  4.1   Chloride 98 - 111 mmol/L 104  105  103   CO2 22 - 32 mmol/L 25  25  25    Calcium 8.9 - 10.3 mg/dL 8.8  8.6  8.7    DG Neck Soft Tissue Result Date: 07/03/2023 CLINICAL DATA:  9965 Edema 9965 EXAM: NECK SOFT TISSUES - 1 VIEW COMPARISON:  None Available. FINDINGS: Evaluation is limited by single AP view of the neck. Retropharyngeal soft tissues cannot be assessed on AP view. Atherosclerotic calcifications. Partial visualization of the port. IMPRESSION: Evaluation is limited by single AP  view of the neck. Retropharyngeal soft tissues cannot be assessed on AP view. Electronically Signed   By: Meda Klinefelter M.D.   On: 07/03/2023 08:06   DG Chest Port 1 View Result Date: 07/03/2023 CLINICAL DATA:  [id number] Chest pain [id number].  History of sarcoidosis EXAM: PORTABLE CHEST 1 VIEW COMPARISON:  July 03, 2023, April 25, 2021 FINDINGS: The cardiomediastinal silhouette is unchanged in contour with enlarged hilar contours and prominent paratracheal stripe, consistent with history of sarcoidosis.RIGHT chest port with tip terminating over the superior cavoatrial junction. No pleural effusion. No pneumothorax. There are a few scattered streaky bibasilar linear appearing opacities. IMPRESSION: There are few scattered streaky bibasilar linear appearing opacities, favored to reflect atelectasis. Electronically Signed   By: Meda Klinefelter M.D.   On: 07/03/2023 08:05   DG Chest Portable 1 View Result Date: 07/03/2023 CLINICAL DATA:  Bleeding at the port site. Recently placed right chest port. EXAM: PORTABLE CHEST 1 VIEW COMPARISON:  Chest CT without contrast 11/26/2021 FINDINGS: Cardiac size is normal. There is tortuosity, scattered calcific plaque with stable mediastinum. The lungs are hypoinflated but generally clear. No pneumothorax or pleural effusion is seen. There is new demonstration of a right chest port with IJ approach catheter terminating at the level of the superior cavoatrial junction. There is no visible break in the catheter or disconnection from the port. Thoracic cage is intact. IMPRESSION: 1. No acute radiographic chest findings. 2. Right chest port with IJ approach catheter terminating at the level of the superior cavoatrial junction. No visible break in the catheter or disconnection from the port. Electronically Signed   By: Almira Bar M.D.   On: 07/03/2023 01:30    Family Communication: Discussed with patient, sons at bedside they understand and agree. All questions  answered.  Disposition: Status is: Inpatient Remains inpatient appropriate because: thrombocytopenia, anemia requiring close monitoring.  Planned Discharge Destination: Home with Home Health     Time spent: 40 minutes  Author: Marcelino Duster, MD 07/04/2023 4:42 PM Secure chat 7am to 7pm For on call review [email or web address].

## 2023-07-04 NOTE — Consult Note (Signed)
 [facility]  [phone] [phone]  ID: Colin Barron OB: Apr 02, 1946  MR#: [id number]  MVH#:[id number]  Patient Care Team: Tally Joe, MD as PCP - General (Family Medicine) Salena Saner, MD as Consulting Physician (Pulmonary Disease) Michaelyn Barter, MD as Consulting Physician (Oncology)  REFERRING PROVIDER: Dr. Arville Care  REASON FOR REFERRAL: MDS with excess blasts  ASSESSMENT AND PLAN:   Colin Barron is a 78 y.o. male with pmh of  sarcoidosis, BPH last follows with [facility] presented to ED with complaint of bleeding from the port site and was found to have platelets of 12.  # MDS with excess blast # Thrombocytopenia # Anemia -Follows with Dr. Mayford Knife at [facility].  He was started on Inoqvi days 1-3 on 06/28/2023.  He is also on Valtrex and allopurinol. -Continue supportive care with irradiated platelet transfusion for less than 10,000 or to maintain more than 30,000 if bleeding.  PRBC transfusion for hemoglobin less than 7. -Continue with twice weekly monitoring with possible transfusion scheduled at [facility]. -Continue follow-up as scheduled with the [facility] team.  # Angioedema of the upper lip and tongue -On arrival to ED, swelling was noted in upper lip and tongue.  Patient was having difficulty speaking.  He was treated with IV Solu-Medrol and IV Benadryl.  Swelling has improved.  -Unclear etiology. He has newly started Inoqvi but that was more than a week ago.  Denies starting any new medications.  Patient was advised to make his Oncology team aware.  Thank you for referral your referral.  Patient expressed understanding and was in agreement with this plan. He also understands that He can call clinic at any time with any questions, concerns, or complaints.   I spent a total of 60 minutes reviewing chart data, face-to-face evaluation with the patient, counseling and coordination of care as detailed above.   HPI: Colin Barron is a 78  y.o. male with past medical history of sarcoidosis, BPH last follows with [facility] presented to ED with complaint of bleeding from the port site and was found to have platelets of 12.  Last seen by Dr. Mayford Knife at [facility] on June 27, 2023.  He was started on Inoqvi day 1-3 on 06/28/2023 for new diagnosis of MDS with excess blast.  He is on Valtrex and allopurinol.  Patient had port placed on 06/28/2023.  On Saturday, he noticed that the dressing was saturated with blood.  On admission, platelets of 12,000.  He received 2 units of irradiated platelets.  Hemoglobin of 7.4 on admission which dropped to 6.8.  He received 1 unit of PRBC transfusion.  In the ED, he was also found to have swelling of the upper lip and the tongue.  He was treated with IV Solu-Medrol and Benadryl.  Swelling has significantly improved today.   REVIEW OF SYSTEMS:   ROS  As per HPI. Otherwise, a complete review of systems is negative.  PAST MEDICAL HISTORY: Past Medical History:  Diagnosis Date   Arthritis    BPH (benign prostatic hyperplasia) 12/08/2016    PAST SURGICAL HISTORY: Past Surgical History:  Procedure Laterality Date   BIOPSY PROSTATE     CHOLECYSTECTOMY     CYST REMOVAL NECK     TONSILLECTOMY      FAMILY HISTORY: Family History  Problem Relation Age of Onset   Diabetes Mother    Hypertension Mother    Intracerebral hemorrhage Brother    Hypertension Brother    Stroke Maternal Grandmother  Stroke Maternal Grandfather    Hypertension Brother        POSS    HEALTH MAINTENANCE: Social History   Tobacco Use   Smoking status: Never   Smokeless tobacco: Never  Vaping Use   Vaping status: Never Used  Substance Use Topics   Alcohol use: No   Drug use: No     Allergies  Allergen Reactions   Amoxicillin Other (See Comments)    Tolerated Rocephin IV on 04/25/2021      Current Facility-Administered Medications  Medication Dose Route Frequency Provider Last Rate Last Admin   0.9 %  sodium  chloride infusion   Intravenous Once Mansy, Jan A, MD       acetaminophen (TYLENOL) tablet 650 mg  650 mg Oral Q6H PRN Mansy, Jan A, MD       Or   acetaminophen (TYLENOL) suppository 650 mg  650 mg Rectal Q6H PRN Mansy, Jan A, MD       allopurinol (ZYLOPRIM) tablet 300 mg  300 mg Oral Daily Marcelino Duster, MD   300 mg at 07/04/23 1118   Chlorhexidine Gluconate Cloth 2 % PADS 6 each  6 each Topical Q0600 Lurene Shadow, MD   6 each at 07/04/23 1100   diphenhydrAMINE (BENADRYL) injection 25 mg  25 mg Intravenous Q6H Manuela Schwartz, NP   25 mg at 07/04/23 0853   famotidine (PEPCID) IVPB 20 mg premix  20 mg Intravenous Q12H Manuela Schwartz, NP 100 mL/hr at 07/04/23 1123 20 mg at 07/04/23 1123   magnesium hydroxide (MILK OF MAGNESIA) suspension 30 mL  30 mL Oral Daily PRN Mansy, Jan A, MD       methylPREDNISolone sodium succinate (SOLU-MEDROL) 125 mg/2 mL injection 60 mg  60 mg Intravenous Q6H Manuela Schwartz, NP   60 mg at 07/04/23 1236   ondansetron (ZOFRAN) tablet 4 mg  4 mg Oral Q6H PRN Mansy, Jan A, MD       Or   ondansetron [facility]) injection 4 mg  4 mg Intravenous Q6H PRN Mansy, Vernetta Honey, MD       Oral care mouth rinse  15 mL Mouth Rinse PRN Lurene Shadow, MD       traZODone (DESYREL) tablet 25 mg  25 mg Oral QHS PRN Mansy, Jan A, MD       valACYclovir (VALTREX) tablet 500 mg  500 mg Oral Daily Marcelino Duster, MD   500 mg at 07/04/23 1119    OBJECTIVE: Vitals:   07/04/23 1000 07/04/23 1100  BP: 117/64 109/63  Pulse: 63 77  Resp: (!) 6 15  Temp:    SpO2: 99% 99%     Body mass index is 23.15 kg/m.      General: Well-developed, well-nourished, no acute distress. Eyes: Pink conjunctiva, anicteric sclera. HEENT: Normocephalic, moist mucous membranes, clear oropharnyx. Lungs: Clear to auscultation bilaterally. Heart: Regular rate and rhythm. No rubs, murmurs, or gallops. Abdomen: Soft, nontender, nondistended. No organomegaly noted, normoactive bowel  sounds. Musculoskeletal: No edema, cyanosis, or clubbing. Neuro: Alert, answering all questions appropriately. Cranial nerves grossly intact. Skin: No rashes or petechiae noted. Psych: Normal affect. Lymphatics: No cervical, calvicular, axillary or inguinal LAD.   LAB RESULTS:  Lab Results  Component Value Date   NA 136 07/04/2023   K 4.0 07/04/2023   CL 104 07/04/2023   CO2 25 07/04/2023   GLUCOSE 138 (H) 07/04/2023   BUN 14 07/04/2023   CREATININE 0.68 07/04/2023   CALCIUM 8.8 (L) 07/04/2023   PROT 6.8  07/03/2023   ALBUMIN 3.6 07/03/2023   AST 14 (L) 07/03/2023   ALT 10 07/03/2023   ALKPHOS 51 07/03/2023   BILITOT 1.2 07/03/2023   GFRNONAA >60 07/04/2023   GFRAA 100 12/21/2019    Lab Results  Component Value Date   WBC 3.4 (L) 07/04/2023   NEUTROABS 5.4 07/03/2023   HGB 8.0 (L) 07/04/2023   HCT 24.2 (L) 07/04/2023   MCV 101.3 (H) 07/04/2023   PLT 42 (L) 07/04/2023    No results found for: "TIBC", "FERRITIN", "IRONPCTSAT"   STUDIES: DG Neck Soft Tissue Result Date: 07/03/2023 CLINICAL DATA:  9965 Edema 9965 EXAM: NECK SOFT TISSUES - 1 VIEW COMPARISON:  None Available. FINDINGS: Evaluation is limited by single AP view of the neck. Retropharyngeal soft tissues cannot be assessed on AP view. Atherosclerotic calcifications. Partial visualization of the port. IMPRESSION: Evaluation is limited by single AP view of the neck. Retropharyngeal soft tissues cannot be assessed on AP view. Electronically Signed   By: Meda Klinefelter M.D.   On: 07/03/2023 08:06   DG Chest Port 1 View Result Date: 07/03/2023 CLINICAL DATA:  [id number] Chest pain [id number].  History of sarcoidosis EXAM: PORTABLE CHEST 1 VIEW COMPARISON:  July 03, 2023, April 25, 2021 FINDINGS: The cardiomediastinal silhouette is unchanged in contour with enlarged hilar contours and prominent paratracheal stripe, consistent with history of sarcoidosis.RIGHT chest port with tip terminating over the superior cavoatrial  junction. No pleural effusion. No pneumothorax. There are a few scattered streaky bibasilar linear appearing opacities. IMPRESSION: There are few scattered streaky bibasilar linear appearing opacities, favored to reflect atelectasis. Electronically Signed   By: Meda Klinefelter M.D.   On: 07/03/2023 08:05   DG Chest Portable 1 View Result Date: 07/03/2023 CLINICAL DATA:  Bleeding at the port site. Recently placed right chest port. EXAM: PORTABLE CHEST 1 VIEW COMPARISON:  Chest CT without contrast 11/26/2021 FINDINGS: Cardiac size is normal. There is tortuosity, scattered calcific plaque with stable mediastinum. The lungs are hypoinflated but generally clear. No pneumothorax or pleural effusion is seen. There is new demonstration of a right chest port with IJ approach catheter terminating at the level of the superior cavoatrial junction. There is no visible break in the catheter or disconnection from the port. Thoracic cage is intact. IMPRESSION: 1. No acute radiographic chest findings. 2. Right chest port with IJ approach catheter terminating at the level of the superior cavoatrial junction. No visible break in the catheter or disconnection from the port. Electronically Signed   By: Almira Bar M.D.   On: 07/03/2023 01:30    Michaelyn Barter, MD   07/04/2023 12:57 PM

## 2023-07-05 DIAGNOSIS — D62 Acute posthemorrhagic anemia: Secondary | ICD-10-CM | POA: Diagnosis not present

## 2023-07-05 DIAGNOSIS — T783XXA Angioneurotic edema, initial encounter: Secondary | ICD-10-CM | POA: Diagnosis not present

## 2023-07-05 DIAGNOSIS — D469 Myelodysplastic syndrome, unspecified: Secondary | ICD-10-CM | POA: Diagnosis not present

## 2023-07-05 DIAGNOSIS — I951 Orthostatic hypotension: Secondary | ICD-10-CM | POA: Insufficient documentation

## 2023-07-05 DIAGNOSIS — D696 Thrombocytopenia, unspecified: Secondary | ICD-10-CM | POA: Diagnosis not present

## 2023-07-05 LAB — CBC
HCT: 24.8 % — ABNORMAL LOW (ref 39.0–52.0)
Hemoglobin: 8.4 g/dL — ABNORMAL LOW (ref 13.0–17.0)
MCH: 34.1 pg — ABNORMAL HIGH (ref 26.0–34.0)
MCHC: 33.9 g/dL (ref 30.0–36.0)
MCV: 100.8 fL — ABNORMAL HIGH (ref 80.0–100.0)
Platelets: 37 10*3/uL — ABNORMAL LOW (ref 150–400)
RBC: 2.46 MIL/uL — ABNORMAL LOW (ref 4.22–5.81)
RDW: 18.5 % — ABNORMAL HIGH (ref 11.5–15.5)
WBC: 9.4 10*3/uL (ref 4.0–10.5)
nRBC: 0.2 % (ref 0.0–0.2)

## 2023-07-05 LAB — BASIC METABOLIC PANEL
Anion gap: 8 (ref 5–15)
BUN: 23 mg/dL (ref 8–23)
CO2: 25 mmol/L (ref 22–32)
Calcium: 9.2 mg/dL (ref 8.9–10.3)
Chloride: 104 mmol/L (ref 98–111)
Creatinine, Ser: 0.79 mg/dL (ref 0.61–1.24)
GFR, Estimated: >60 mL/min (ref >60)
Glucose, Bld: 140 mg/dL — ABNORMAL HIGH (ref 70–99)
Potassium: 4.1 mmol/L (ref 3.5–5.1)
Sodium: 137 mmol/L (ref 135–145)

## 2023-07-05 MED ORDER — SODIUM CHLORIDE 0.9 % IV SOLN: INTRAVENOUS | Status: AC

## 2023-07-05 MED ORDER — FAMOTIDINE 20 MG PO TABS
20.0000 mg | ORAL_TABLET | Freq: Two times a day (BID) | ORAL | Status: DC
  Administered 2023-07-06 – 2023-07-07 (×3): 20 mg via ORAL
  Filled 2023-07-05 (×4): qty 1

## 2023-07-05 NOTE — Progress Notes (Signed)
 Transition of Care [facility]) - Inpatient Brief Assessment   Patient Details  Name: Colin Barron MRN: [id number] Date of Birth: [birth year]  Transition of [facility] CM/SW Contact:    Truddie Hidden, RN Phone Number: 07/05/2023, 10:05 AM   Clinical Narrative: TOC continuing to follow patient's progress throughout discharge planning.   Transition of Care Asessment:

## 2023-07-05 NOTE — Evaluation (Addendum)
 Occupational Therapy Evaluation Patient Details Name: Colin Barron MRN: [id number] DOB: 10/01/1945 Today's Date: 07/05/2023   History of Present Illness   Pt is a 78 yo male that presented to ED for acute onset of bleeding from newly inserted port, noted for severe thrombocytopenia, ABLA. PMH of MDS treated by [facility], BPH, OA.     Clinical Impressions Pt received with PT obtaining orthostatic assessment due to reported dizziness upon standing. Pt alert, noted for overall confusion and reports feeling "groggy". Per family and pt, he is independent for ADLs and mobility.   On eval, pt required CGA for bed mobility, able to don/doff socks seated EOB with setup, and transfers from bed > recliner with 1 person HHA and minA. UB bathing/grooming tasks completed recliner level with pt requiring max multimodal cuing for performance due to impairments in executive functioning. Pt could not appropriately terminate task performance, and perseverates on washing face multiple times requiring frequent redirection. Orthostatic vitals assessed (see flowsheet for further details), 81/52 in standing with HR 81 and pt symptomatic (+2 for safety during standing BP with PT). If pt to discharge home, he will require 24/7 supervision for all ADL performance due to mobility and cognitive deficits. Pt would benefit from skilled OT services to address noted impairments and functional limitations (see below for any additional details) in order to maximize safety and independence while minimizing falls risk and caregiver burden. Patient will benefit from continued inpatient follow up therapy, <3 hours/day as he is not currently at his baseline level of functioning.        If plan is discharge home, recommend the following:   A lot of help with walking and/or transfers;A lot of help with bathing/dressing/bathroom;Direct supervision/assist for medications management;Direct supervision/assist for financial  management;Assist for transportation;Supervision due to cognitive status;Help with stairs or ramp for entrance     Functional Status Assessment   Patient has had a recent decline in their functional status and demonstrates the ability to make significant improvements in function in a reasonable and predictable amount of time.     Equipment Recommendations   None recommended by OT (defer to next LOC)      Precautions/Restrictions   Precautions Precautions: Fall Restrictions Weight Bearing Restrictions Per Provider Order: No     Mobility Bed Mobility Overal bed mobility: Needs Assistance Bed Mobility: Supine to Sit, Sit to Supine     Supine to sit: Contact guard, HOB elevated, Used rails          Transfers Overall transfer level: Needs assistance Equipment used: 1 person hand held assist Transfers: Sit to/from Stand Sit to Stand: Contact guard assist                  Balance Overall balance assessment: Needs assistance Sitting-balance support: Feet supported Sitting balance-Leahy Scale: Good     Standing balance support: Single extremity supported Standing balance-Leahy Scale: Poor                             ADL either performed or assessed with clinical judgement   ADL Overall ADL's : Needs assistance/impaired     Grooming: Wash/dry hands;Wash/dry face;Sitting;Cueing for sequencing;Cueing for safety Grooming Details (indicate cue type and reason): recliner level, max multimodal cuing for task segmentation as pt cannot terminate task appropriately, requires cues for performance Upper Body Bathing: Sitting;Moderate assistance Upper Body Bathing Details (indicate cue type and reason): UB bathing in recliner, pt frequently abandons UB  bathing under arms, returning to washing face, requiring frequent directional cuing         Lower Body Dressing: Sitting/lateral leans;Cueing for sequencing;Set up Lower Body Dressing Details (indicate  cue type and reason): dons/doffs socks with setupo Toilet Transfer: Minimal assistance;Stand-pivot;BSC/3in1 Toilet Transfer Details (indicate cue type and reason): simulated bed > recliner Toileting- Clothing Manipulation and Hygiene: Maximal assistance;Sit to/from stand         General ADL Comments: Pt reported dizziness in standing. Cognitive status perhaps due to medications, generally confused      Pertinent Vitals/Pain Pain Assessment Pain Assessment: No/denies pain Faces Pain Scale: Hurts a little bit     Extremity/Trunk Assessment Upper Extremity Assessment Upper Extremity Assessment: Generalized weakness   Lower Extremity Assessment Lower Extremity Assessment: Generalized weakness   Cervical / Trunk Assessment Cervical / Trunk Assessment: Normal   Communication Communication Communication: Impaired Factors Affecting Communication: Difficulty expressing self   Cognition Arousal: Alert, Suspect due to medications Behavior During Therapy: Flat affect Cognition: Difficult to assess             OT - Cognition Comments: Pt with overall flat affect, often requiring redirection to task, perseverating on playing with telemetry box                 Following commands: Impaired Following commands impaired: Follows one step commands with increased time     Cueing  General Comments   Cueing Techniques: Verbal cues;Tactile cues;Gestural cues  orthostatic noted in standing, BP seated in recliner 131/70 end of session           Home Living Family/patient expects to be discharged to:: Private residence Living Arrangements: Other relatives (brother) Available Help at Discharge: Family;Available 24 hours/day Type of Home: House Home Access: Level entry     Home Layout: Multi-level     Bathroom Shower/Tub: Tub/shower unit;Walk-in shower   Bathroom Toilet: Standard     Home Equipment: Agricultural consultant (2 wheels)          Prior  Functioning/Environment Prior Level of Function : Independent/Modified Independent             Mobility Comments: no recent falls ADLs Comments: family assists with transportation    OT Problem List: Decreased strength;Decreased range of motion;Impaired balance (sitting and/or standing);Decreased activity tolerance;Decreased cognition;Decreased knowledge of use of DME or AE   OT Treatment/Interventions: Self-care/ADL training;Therapeutic exercise;Neuromuscular education;Energy conservation;DME and/or AE instruction;Therapeutic activities;Patient/family education;Balance training      OT Goals(Current goals can be found in the care plan section)   Acute Rehab OT Goals OT Goal Formulation: With patient Time For Goal Achievement: 07/19/23 Potential to Achieve Goals: Good   OT Frequency:  Min 2X/week       AM-PAC OT "6 Clicks" Daily Activity     Outcome Measure Help from another person eating meals?: None Help from another person taking care of personal grooming?: A Little Help from another person toileting, which includes using toliet, bedpan, or urinal?: A Lot Help from another person bathing (including washing, rinsing, drying)?: A Lot Help from another person to put on and taking off regular upper body clothing?: A Little Help from another person to put on and taking off regular lower body clothing?: A Little 6 Click Score: 17   End of Session Nurse Communication: Mobility status;Other (comment) (positive orthostatic vital signs)  Activity Tolerance: Patient tolerated treatment well Patient left: in chair;with call bell/phone within reach;with chair alarm set;with family/visitor present  OT Visit Diagnosis: Unsteadiness on feet (  R26.81);Other abnormalities of gait and mobility (R26.89);Muscle weakness (generalized) (M62.81)                Time: 3875-6433 OT Time Calculation (min): 26 min Charges:  OT General Charges $OT Visit: 1 Visit OT Evaluation $OT Eval Low  Complexity: 1 Low OT Treatments $Self Care/Home Management : 8-22 mins  Vernis Eid L. Aleesia Henney, OTR/L  07/05/23, 2:00 PM

## 2023-07-05 NOTE — Progress Notes (Signed)
 Patient refused all medications this morning.  Dr. Clide Dales notified and he spoke with the patient and family at bedside.  Per Dr. Clide Dales, patient states he had already taken his medications given to him by his son this AM.  Patient and son educated that home medications have to be approved by the MD when in the hospital.  Jonny Ruiz, RN (Charge) and Judeth Cornfield, Charity fundraiser Emergency planning/management officer) also notified.

## 2023-07-05 NOTE — Evaluation (Signed)
 Physical Therapy Evaluation Patient Details Name: Colin Barron MRN: [id number] DOB: March 15, 1946 Today's Date: 07/05/2023  History of Present Illness  Pt is a 78 yo male that presented to ED for acute onset of bleeding from newly inserted port, noted for severe thrombocytopenia, ABLA. PMH of MDS treated by [facility], BPH, OA.  Clinical Impression  Pt alert, agreeable to PT but noted for some situational confusion, needed redirection occasionally to remain on task, difficulty with motor sequencing noted. Pt did endorse feeling groggy; able to identify family in room. Per pt/family, at baseline he is independent for ambulation and ADLs.  Supine to sit at least twice this session, CGA with use of bed rails and verbal cues. Sit <> stand twice as well, CGA with handheld assist. Pt reported light headedness so with second mobility attempts, orthostatics assessed and pt positive, RN and MD notified, and session modified accordingly. He was able to take a few steps towards [facility] with handheld assist but noted to be unsteady, minA needed. Pt seated EOB with BP WFLs and OT at bedside (OT present for orthostatic vitals assessment for 2+ for safety).  Overall the patient demonstrated deficits (see "PT Problem List") that impede the patient's functional abilities, safety, and mobility and would benefit from skilled PT intervention and continued assessment of pt function.         If plan is discharge home, recommend the following: A lot of help with walking and/or transfers;A lot of help with bathing/dressing/bathroom;Assist for transportation;Assistance with cooking/housework;Help with stairs or ramp for entrance   Can travel by private vehicle   No    Equipment Recommendations Other (comment) (TBD)  Recommendations for Other Services       Functional Status Assessment Patient has had a recent decline in their functional status and demonstrates the ability to make significant improvements in function in a  reasonable and predictable amount of time.     Precautions / Restrictions Precautions Precautions: Fall Restrictions Weight Bearing Restrictions Per Provider Order: No      Mobility  Bed Mobility Overal bed mobility: Needs Assistance Bed Mobility: Supine to Sit, Sit to Supine     Supine to sit: Contact guard, HOB elevated, Used rails Sit to supine: Contact guard assist        Transfers Overall transfer level: Needs assistance Equipment used: 1 person hand held assist Transfers: Sit to/from Stand Sit to Stand: Contact guard assist                Ambulation/Gait Ambulation/Gait assistance: Min assist Gait Distance (Feet): 2 Feet Assistive device: 1 person hand held assist         General Gait Details: staggering noted with side stepping at EOB, minA for steadying  Stairs            Wheelchair Mobility     Tilt Bed    Modified Rankin (Stroke Patients Only)       Balance Overall balance assessment: Needs assistance Sitting-balance support: Feet supported Sitting balance-Leahy Scale: Good Sitting balance - Comments: able to don socks with just verbal cues and setup   Standing balance support: Single extremity supported Standing balance-Leahy Scale: Poor                               Pertinent Vitals/Pain Pain Assessment Pain Assessment: Faces Faces Pain Scale: Hurts a little bit    Home Living Family/patient expects to be discharged to:: Private residence Living  Arrangements: Other relatives (brother) Available Help at Discharge: Family;Available 24 hours/day Type of Home: House Home Access: Level entry       Home Layout: Multi-level Home Equipment: Agricultural consultant (2 wheels);Cane - single point      Prior Function Prior Level of Function : Independent/Modified Independent             Mobility Comments: no recent falls, denies AD use ADLs Comments: family assists with transportation     Extremity/Trunk  Assessment   Upper Extremity Assessment Upper Extremity Assessment: Generalized weakness    Lower Extremity Assessment Lower Extremity Assessment: Generalized weakness       Communication        Cognition Arousal: Alert Behavior During Therapy: Flat affect                           PT - Cognition Comments: pt displayed some situational confusion during session, did state he felt groggy. oriented to self, family, time. occasional redirection to task needed Following commands: Intact       Cueing Cueing Techniques: Verbal cues, Tactile cues, Gestural cues     General Comments General comments (skin integrity, edema, etc.): orthostatic noted especially with standing    Exercises     Assessment/Plan    PT Assessment Patient needs continued PT services  PT Problem List Decreased strength;Decreased activity tolerance;Decreased balance;Decreased mobility;Decreased knowledge of precautions       PT Treatment Interventions DME instruction;Balance training;Gait training;Neuromuscular re-education;Stair training;Patient/family education;Functional mobility training;Therapeutic activities;Therapeutic exercise    PT Goals (Current goals can be found in the Care Plan section)  Acute Rehab PT Goals Patient Stated Goal: to go home PT Goal Formulation: With patient Time For Goal Achievement: 07/19/23 Potential to Achieve Goals: Good    Frequency Min 2X/week     Co-evaluation               AM-PAC PT "6 Clicks" Mobility  Outcome Measure Help needed turning from your back to your side while in a flat bed without using bedrails?: None Help needed moving from lying on your back to sitting on the side of a flat bed without using bedrails?: None Help needed moving to and from a bed to a chair (including a wheelchair)?: None Help needed standing up from a chair using your arms (e.g., wheelchair or bedside chair)?: A Little Help needed to walk in hospital room?: A  Little Help needed climbing 3-5 steps with a railing? : A Lot 6 Click Score: 20    End of Session   Activity Tolerance: Patient limited by fatigue Patient left: Other (comment) (seated EOB with OT)   PT Visit Diagnosis: Other abnormalities of gait and mobility (R26.89);Difficulty in walking, not elsewhere classified (R26.2);Muscle weakness (generalized) (M62.81)    Time: 1000-1026 PT Time Calculation (min) (ACUTE ONLY): 26 min   Charges:   PT Evaluation $PT Eval Low Complexity: 1 Low PT Treatments $Therapeutic Activity: 8-22 mins PT General Charges $$ ACUTE PT VISIT: 1 Visit        Olga Coaster PT, DPT 11:53 AM,07/05/23

## 2023-07-05 NOTE — Progress Notes (Signed)
 Progress Note   Patient: Colin Barron ZOX:[id number] DOB: 1945/06/20 DOA: 07/02/2023     2 DOS: the patient was seen and examined on 07/05/2023   Brief hospital course: LONGINO TREFZ is a 78 y.o. male with medical history significant for MDS, currently being treated at [facility], osteoarthritis and BPH, who presented to the emergency room with acute onset of bleeding from his newly inserted port.  He had this inserted by IR at [facility] on 06/30/2023.   Family noticed that the dressing was saturated with blood.  He had received blood transfusion prior to Port-A-Cath placement.  Patient's hemoglobin 6.8, platelet count 12 upon presentation, admitted to hospitalist service for further management evaluation of severe thrombocytopenia, acute blood loss anemia due to bleeding from Port-A-Cath site.  Oncology evaluated advised [facility] onc team follow up as outpatient.  Today he is orthostatic, has dizziness while working with PT. Discharge held.  Assessment and Plan: Severe thrombocytopenia  Severe anemia Acute blood loss anemia from bleeding at Port-A-Cath site, Myelodysplastic syndrome: Hemoglobin improved to 8.4 from 6.8 on presentation and platelet count 37 from 12.  Patient got 2 units of Platelets, 1 unit PRBC. Oncologist advised to continue supportive care with platelets and PRBC transfusion as needed to maintain platelet count above 10, hemoglobin above 7. Patient will continue to follow-up with [facility] oncology team.  Continue home dose Valtrex. Daily CBC ordered.   Orthostatic hypotension- Poor oral intake. Advised oral diet, fluids. Gentle IV fluids ordered.  Angioedema of the upper lip: Improved.  He was treated with IV Solu-Medrol, IV Benadryl. Stop steroids.  He was started on Inqovi. Advised to let [facility] oncology team know regarding the reaction.  General weakness and dizziness:  In the setting of severe anemia, MDS. Has been deteriorating from baseline. PT OT advised rehab.  Gout  -allopurinol restarted.  Other comorbidities BPH, osteoarthritis       Out of bed to chair. Incentive spirometry. Nursing supportive care. Fall, aspiration precautions. DVT prophylaxis   Code Status: Full Code  Subjective: Patient is seen and examined today morning. He is confused this morning, stating that he took his meds. He worked with PT felt dizzy, positive orthostasis noted. I advised him to stay another day for IV hydration.  Physical Exam: Vitals:   07/05/23 0418 07/05/23 0806 07/05/23 1030 07/05/23 1216  BP: 125/72 (!) 140/81 122/75 109/64  Pulse: 72 72  91  Resp: 17 18  16   Temp: 98.4 F (36.9 C) 98.2 F (36.8 C)  98.3 F (36.8 C)  TempSrc:    Oral  SpO2: 95% 95%  99%  Weight:      Height:        General - Elderly weak African American male, no respiratory distress HEENT - PERRLA, EOMI, atraumatic head, right port with dressing noted, minimal bleeding. Lung - Clear, basal rales, rhonchi, no wheezes. Heart - S1, S2 heard, no murmurs, rubs, trace pedal edema. Abdomen - Soft, non tender, bowel sounds good Neuro - Alert, awake and oriented, talks slowly, non focal exam. Skin - Warm and dry.  Data Reviewed:      Latest Ref Rng & Units 07/05/2023    4:45 AM 07/04/2023    3:37 AM 07/03/2023    2:02 PM  CBC  WBC 4.0 - 10.5 K/uL 9.4  3.4  6.7   Hemoglobin 13.0 - 17.0 g/dL 8.4  8.0  8.4   Hematocrit 39.0 - 52.0 % 24.8  24.2  25.2   Platelets 150 -  400 K/uL 37  42  49       Latest Ref Rng & Units 07/05/2023    4:45 AM 07/04/2023    3:37 AM 07/03/2023    5:02 AM  BMP  Glucose 70 - 99 mg/dL 161  096  94   BUN 8 - 23 mg/dL 23  14  18    Creatinine 0.61 - 1.24 mg/dL 0.45  4.09  8.11   Sodium 135 - 145 mmol/L 137  136  136   Potassium 3.5 - 5.1 mmol/L 4.1  4.0  3.9   Chloride 98 - 111 mmol/L 104  104  105   CO2 22 - 32 mmol/L 25  25  25    Calcium 8.9 - 10.3 mg/dL 9.2  8.8  8.6    No results found.   Family Communication: Discussed with patient, sons at  bedside they understand and agree. All questions answered.  Disposition: Status is: Inpatient Remains inpatient appropriate because: thrombocytopenia, anemia requiring close monitoring. Weakness need placement.  Planned Discharge Destination: Home with Home Health     Time spent: 39 minutes  Author: Marcelino Duster, MD 07/05/2023 1:22 PM Secure chat 7am to 7pm For on call review [email or web address].

## 2023-07-05 NOTE — Progress Notes (Signed)
 Patient refuses all medications, son administered at bedside. Dr aware and family reeducated on administering medications.

## 2023-07-05 NOTE — Plan of Care (Signed)

## 2023-07-05 NOTE — Plan of Care (Signed)

## 2023-07-06 ENCOUNTER — Inpatient Hospital Stay

## 2023-07-06 DIAGNOSIS — D696 Thrombocytopenia, unspecified: Secondary | ICD-10-CM | POA: Diagnosis not present

## 2023-07-06 DIAGNOSIS — D62 Acute posthemorrhagic anemia: Secondary | ICD-10-CM | POA: Diagnosis not present

## 2023-07-06 DIAGNOSIS — T783XXA Angioneurotic edema, initial encounter: Secondary | ICD-10-CM | POA: Diagnosis not present

## 2023-07-06 DIAGNOSIS — D469 Myelodysplastic syndrome, unspecified: Secondary | ICD-10-CM | POA: Diagnosis not present

## 2023-07-06 LAB — CBC
HCT: 23.5 % — ABNORMAL LOW (ref 39.0–52.0)
HCT: 28.2 % — ABNORMAL LOW (ref 39.0–52.0)
Hemoglobin: 7.7 g/dL — ABNORMAL LOW (ref 13.0–17.0)
Hemoglobin: 9.2 g/dL — ABNORMAL LOW (ref 13.0–17.0)
MCH: 34.1 pg — ABNORMAL HIGH (ref 26.0–34.0)
MCH: 33.9 pg (ref 26.0–34.0)
MCHC: 32.8 g/dL (ref 30.0–36.0)
MCHC: 32.6 g/dL (ref 30.0–36.0)
MCV: 104 fL — ABNORMAL HIGH (ref 80.0–100.0)
MCV: 104.1 fL — ABNORMAL HIGH (ref 80.0–100.0)
Platelets: 21 10*3/uL — CL (ref 150–400)
Platelets: 21 10*3/uL — CL (ref 150–400)
RBC: 2.26 MIL/uL — ABNORMAL LOW (ref 4.22–5.81)
RBC: 2.71 MIL/uL — ABNORMAL LOW (ref 4.22–5.81)
RDW: 18.2 % — ABNORMAL HIGH (ref 11.5–15.5)
RDW: 17.9 % — ABNORMAL HIGH (ref 11.5–15.5)
WBC: 9.5 10*3/uL (ref 4.0–10.5)
WBC: 15.8 10*3/uL — ABNORMAL HIGH (ref 4.0–10.5)
nRBC: 0.2 % (ref 0.0–0.2)
nRBC: 0.2 % (ref 0.0–0.2)

## 2023-07-06 NOTE — Plan of Care (Signed)
  Problem: Activity: Goal: Risk for activity intolerance will decrease Outcome: Progressing   Problem: Nutrition: Goal: Adequate nutrition will be maintained Outcome: Progressing   Problem: Pain Managment: Goal: General experience of comfort will improve and/or be controlled Outcome: Progressing   Problem: Skin Integrity: Goal: Risk for impaired skin integrity will decrease Outcome: Progressing

## 2023-07-06 NOTE — Progress Notes (Signed)
 Progress Note   Patient: Colin Barron BJY:[id number] DOB: November 18, 1945 DOA: 07/02/2023     3 DOS: the patient was seen and examined on 07/06/2023   Brief hospital course: Colin Barron is a 78 y.o. male with medical history significant for MDS, currently being treated at [facility], osteoarthritis and BPH, who presented to the emergency room with acute onset of bleeding from his newly inserted port.  He had this inserted by IR at [facility] on 06/30/2023.   Family noticed that the dressing was saturated with blood.  He had received blood transfusion prior to Port-A-Cath placement.  Patient's hemoglobin 6.8, platelet count 12 upon presentation, admitted to hospitalist service for further management evaluation of severe thrombocytopenia, acute blood loss anemia due to bleeding from Port-A-Cath site.  Oncology evaluated advised [facility] onc team follow up as outpatient.  07/05/23 he is orthostatic, has dizziness while working with PT. Discharge held. 07/06/23 - he is more lethargic today, slow with PT. Colin Barron very slow. A CT head ordered.  Assessment and Plan: Severe thrombocytopenia  Severe anemia Acute blood loss anemia from bleeding at Port-A-Cath site, Myelodysplastic syndrome: Hemoglobin improved to 7.7, platelets 21 post 2 units of Platelets, 1 unit PRBC. Oncologist advised to continue supportive care with platelets and PRBC transfusion as needed to maintain platelet count above 10, or 30 if bleeding and hemoglobin above 7. Patient will continue to follow-up with [facility] oncology team after dc.  Daily CBC ordered.  Change in mental status- He is more sleepy and lethargic, not at baseline. Very slow responding. Eating poor. CT head ordered. Continue neuro checks   Orthostatic hypotension- Poor oral intake. Gentle IV fluids for 1 bag. Today BP better, no dizziness while getting up with PT.  Angioedema of the upper lip: Improved.  He was treated with IV Solu-Medrol, IV Benadryl. Stopped steroids.  He  was started on Inqovi. Advised to let [facility] oncology team know regarding the reaction.  General weakness and dizziness:  In the setting of severe anemia, MDS. Far from his baseline, very lethargic. CT head pending. PT OT advised rehab facility placement. Discussed with family regarding the same.    Out of bed to chair. Incentive spirometry. Nursing supportive care. Fall, aspiration precautions. DVT prophylaxis   Code Status: Full Code  Subjective: Patient is seen and examined today morning. He is more sleepy and lethargic today. Barron slow. Worked with PT but not baseline. A CT head ordered.   Physical Exam: Vitals:   07/05/23 1523 07/05/23 2014 07/05/23 2333 07/06/23 0355  BP: 100/63 113/69 116/67 117/71  Pulse: 67 72 62 61  Resp: 18 18 18 18   Temp: 98.1 F (36.7 C) 98.8 F (37.1 C) 98.8 F (37.1 C) 98.8 F (37.1 C)  TempSrc: Oral Oral    SpO2: 98% 97% 98% 97%  Weight:      Height:        General - Elderly weak African American male, no respiratory distress HEENT - PERRLA, EOMI, atraumatic head, right port with dressing noted, minimal bleeding. Lung - Clear, basal rales, rhonchi, no wheezes. Heart - S1, S2 heard, no murmurs, rubs, trace pedal edema. Abdomen - Soft, non tender, bowel sounds good Neuro - Alert, awake and oriented, talks slowly, non focal exam. Skin - Warm and dry.  Data Reviewed:      Latest Ref Rng & Units 07/06/2023    6:20 AM 07/05/2023    4:45 AM 07/04/2023    3:37 AM  CBC  WBC 4.0 -  10.5 K/uL 9.5  9.4  3.4   Hemoglobin 13.0 - 17.0 g/dL 7.7  8.4  8.0   Hematocrit 39.0 - 52.0 % 23.5  24.8  24.2   Platelets 150 - 400 K/uL 21  37  42       Latest Ref Rng & Units 07/05/2023    4:45 AM 07/04/2023    3:37 AM 07/03/2023    5:02 AM  BMP  Glucose 70 - 99 mg/dL 578  469  94   BUN 8 - 23 mg/dL 23  14  18    Creatinine 0.61 - 1.24 mg/dL 6.29  5.28  4.13   Sodium 135 - 145 mmol/L 137  136  136   Potassium 3.5 - 5.1 mmol/L 4.1  4.0  3.9   Chloride 98 -  111 mmol/L 104  104  105   CO2 22 - 32 mmol/L 25  25  25    Calcium 8.9 - 10.3 mg/dL 9.2  8.8  8.6    No results found.   Family Communication: Discussed with son at bedside, brother over phone.  They understand and agree. All questions answered.  Disposition: Status is: Inpatient Remains inpatient appropriate because: thrombocytopenia, anemia requiring close monitoring. Weakness need placement.  Planned Discharge Destination: Home with Home Health     Time spent: 42 minutes  Author: Marcelino Duster, MD 07/06/2023 3:14 PM Secure chat 7am to 7pm For on call review [email or web address].

## 2023-07-06 NOTE — Progress Notes (Signed)
 Occupational Therapy Treatment Patient Details Name: Colin Barron MRN: [id number] DOB: December 22, 1945 Today's Date: 07/06/2023   History of present illness Pt is a 78 yo male that presented to ED for acute onset of bleeding from newly inserted port, noted for severe thrombocytopenia, ABLA. PMH of MDS treated by [facility], BPH, OA.   OT comments  Pt received upright in recliner, nods in agreement to participate in session. Pt with very flat affect, speaks very minimally and overall appearing more lethargic vs yesterday's evaluation. MinA-CGA for functional sit<>stand transfers from various height surfaces, minA for mobility using RW t/f sink, and CGA with occasional minA for standing grooming tasks at sink. Pt continues to require cues for task segmentation due to deficits in executive functioning specifically in task initiation, sequencing and termination. BP assessed standing after grooming tasks, 94/65 (75) and again after pt returns to recliner in seated position 116/67 (82). Pt noted to have difficulties swallowing pills with RN, and slurred speech that worsened during session. Pt's speech was quiet initially, but progressively worsened and by end of session his responses were inelligble and speech significantly slurred. Assessed vision with tracking WNL, and no focal deficits UE/LE. Alerted RN and MD with findings immediately. Pt left in recliner, needs in reach. Discharge recommendation remains appropriate, OT will continue to follow for functional gains.       If plan is discharge home, recommend the following:  A lot of help with walking and/or transfers;A lot of help with bathing/dressing/bathroom;Direct supervision/assist for medications management;Direct supervision/assist for financial management;Assist for transportation;Supervision due to cognitive status;Help with stairs or ramp for entrance   Equipment Recommendations  None recommended by OT       Precautions / Restrictions  Precautions Precautions: Fall Restrictions Weight Bearing Restrictions Per Provider Order: No       Mobility Bed Mobility               General bed mobility comments: NT, pt recieved and left in recliner    Transfers Overall transfer level: Needs assistance Equipment used: Rolling walker (2 wheels) Transfers: Sit to/from Stand Sit to Stand: Min assist, Contact guard assist           General transfer comment: minA-CGA, cues for hand placement, movements very effortful, requries excessive time to complete     Balance Overall balance assessment: Needs assistance Sitting-balance support: Feet supported Sitting balance-Leahy Scale: Good     Standing balance support: Single extremity supported Standing balance-Leahy Scale: Fair                             ADL either performed or assessed with clinical judgement   ADL Overall ADL's : Needs assistance/impaired     Grooming: Wash/dry face;Wash/dry hands;Minimal assistance;Standing Grooming Details (indicate cue type and reason): standing at sink. pt requiring cues for alertness, slow movements, occ minA for standing balance                             Functional mobility during ADLs: Minimal assistance;Cueing for sequencing;Rolling walker (2 wheels);Cueing for safety General ADL Comments: t/f sink from recliner, minA for environmental negotiation    Extremity/Trunk Assessment Upper Extremity Assessment Upper Extremity Assessment: Generalized weakness (assessed UE for focal deficits, RUE/LUE symmetrical at this time, able to perform ROM slowly but [facility] without significant weakness)            Vision   Vision  Assessment?: Yes Tracking/Visual Pursuits: Able to track stimulus in all quads without difficulty Saccades: Within functional limits         Communication Communication Communication: Impaired Factors Affecting Communication: Reduced clarity of speech;Difficulty expressing self  (Difficulties swallowing pills with RN, noted slurred speech that worsened during OT session. OT could understand pt's responses start of session though he spoke minimally, end of session his responses were inelligble and speech significantly slurred.)   Cognition Arousal: Lethargic Behavior During Therapy: Flat affect Cognition: Difficult to assess Difficult to assess due to: Level of arousal           OT - Cognition Comments: Very flat affect, speaks very minimally, follows commands consistently with increased time. Pt falling asleep standng up while performing grooming tasks at sink.                 Following commands: Intact Following commands impaired: Follows one step commands with increased time               General Comments BP assessed after standing grooming tasks at sink 94/65 (75), returning to seated in recliner 116/67 (82)    Pertinent Vitals/ Pain       Pain Assessment Pain Assessment: No/denies pain         Frequency  Min 2X/week        Progress Toward Goals  OT Goals(current goals can now be found in the care plan section)  Progress towards OT goals: Progressing toward goals  Acute Rehab OT Goals OT Goal Formulation: With patient Time For Goal Achievement: 07/19/23 Potential to Achieve Goals: Good ADL Goals Pt Will Perform Upper Body Bathing: with set-up;sitting Pt Will Perform Lower Body Dressing: with contact guard assist;sit to/from stand Pt Will Transfer to Toilet: with contact guard assist;ambulating Pt Will Perform Toileting - Clothing Manipulation and hygiene: with contact guard assist;sitting/lateral leans;sit to/from stand  Plan         AM-PAC OT "6 Clicks" Daily Activity     Outcome Measure   Help from another person eating meals?: None Help from another person taking care of personal grooming?: A Little Help from another person toileting, which includes using toliet, bedpan, or urinal?: A Lot Help from another person  bathing (including washing, rinsing, drying)?: A Lot Help from another person to put on and taking off regular upper body clothing?: A Little Help from another person to put on and taking off regular lower body clothing?: A Little 6 Click Score: 17    End of Session Equipment Utilized During Treatment: Gait belt;Rolling walker (2 wheels)  OT Visit Diagnosis: Unsteadiness on feet (R26.81);Other abnormalities of gait and mobility (R26.89);Muscle weakness (generalized) (M62.81)   Activity Tolerance Patient tolerated treatment well   Patient Left in chair;with call bell/phone within reach;with chair alarm set   Nurse Communication Mobility status;Other (comment) (alerted MD and RN due to onset of slurred speech and lethargy)        Time: 1610-9604 OT Time Calculation (min): 32 min  Charges: OT General Charges $OT Visit: 1 Visit OT Treatments $Self Care/Home Management : 23-37 mins  Zamyah Wiesman L. [employer], OTR/L  07/06/23, 11:54 AM

## 2023-07-06 NOTE — Plan of Care (Signed)

## 2023-07-06 NOTE — Progress Notes (Signed)
 Physical Therapy Treatment Patient Details Name: Colin Barron MRN: [id number] DOB: February 14, 1946 Today's Date: 07/06/2023   History of Present Illness Pt is a 78 yo male that presented to ED for acute onset of bleeding from newly inserted port, noted for severe thrombocytopenia, ABLA. PMH of MDS treated by [facility], BPH, OA.    PT Comments  Pt sleeping but does wake to voice. Interacts with therapist but delayed, and very flat affect today, remains lethargic but follows commands with increased time. Supine to sit supervision with use of bed rails, sit <> stand with CGA and RW with cues for hand placement each time. Orthostatic vitals assessed which were negative, but pt still complained of light headedness, RN notified. With a close chair follow he ambulated ~34ft with RW and CGA. He did need verbal cues to attend to his environment and manage RW, very decreased gait velocity and pt reported some fatigue at end of activity. Returned to room with needs in reach. The patient would benefit from further skilled PT intervention to continue to progress towards goals.    If plan is discharge home, recommend the following: A lot of help with walking and/or transfers;A lot of help with bathing/dressing/bathroom;Assist for transportation;Assistance with cooking/housework;Help with stairs or ramp for entrance   Can travel by private vehicle     No  Equipment Recommendations  Other (comment) (TBD)    Recommendations for Other Services       Precautions / Restrictions Precautions Precautions: Fall Restrictions Weight Bearing Restrictions Per Provider Order: No     Mobility  Bed Mobility Overal bed mobility: Needs Assistance Bed Mobility: Supine to Sit     Supine to sit: Supervision, Used rails          Transfers Overall transfer level: Needs assistance Equipment used: Rolling walker (2 wheels) Transfers: Sit to/from Stand Sit to Stand: Contact guard assist           General  transfer comment: somewhat effortful, definite CGA for safety. cues for hand placement each time    Ambulation/Gait Ambulation/Gait assistance: Contact guard assist Gait Distance (Feet): 60 Feet (chair follow) Assistive device: Rolling walker (2 wheels)         General Gait Details: very slow, pt with flat affect throughout no LOB but did need verbal and gestrual cues with navigating hallway/obstacles   Stairs             Wheelchair Mobility     Tilt Bed    Modified Rankin (Stroke Patients Only)       Balance Overall balance assessment: Needs assistance Sitting-balance support: Feet supported Sitting balance-Leahy Scale: Good     Standing balance support: Single extremity supported Standing balance-Leahy Scale: Fair Standing balance comment: able to stand and void with urinal                            Communication    Cognition Arousal: Lethargic Behavior During Therapy: Flat affect                             Following commands: Intact Following commands impaired: Follows one step commands with increased time    Cueing Cueing Techniques: Verbal cues, Tactile cues, Gestural cues  Exercises Other Exercises Other Exercises: seated calf raises, LAQ, verbal and visual cues needed Other Exercises: orthostatic vitals assessed, WNLs    General Comments  Pertinent Vitals/Pain Pain Assessment Pain Assessment: No/denies pain    Home Living                          Prior Function            PT Goals (current goals can now be found in the care plan section) Progress towards PT goals: Progressing toward goals    Frequency    Min 2X/week      PT Plan      Co-evaluation              AM-PAC PT "6 Clicks" Mobility   Outcome Measure  Help needed turning from your back to your side while in a flat bed without using bedrails?: None Help needed moving from lying on your back to sitting on the side of  a flat bed without using bedrails?: None Help needed moving to and from a bed to a chair (including a wheelchair)?: None Help needed standing up from a chair using your arms (e.g., wheelchair or bedside chair)?: A Little Help needed to walk in hospital room?: A Little Help needed climbing 3-5 steps with a railing? : A Lot 6 Click Score: 20    End of Session Equipment Utilized During Treatment: Gait belt Activity Tolerance: Patient tolerated treatment well Patient left: in chair;with call bell/phone within reach;with chair alarm set;with family/visitor present Nurse Communication: Mobility status;Other (comment) (BP readings) PT Visit Diagnosis: Other abnormalities of gait and mobility (R26.89);Difficulty in walking, not elsewhere classified (R26.2);Muscle weakness (generalized) (M62.81)     Time: 1610-9604 PT Time Calculation (min) (ACUTE ONLY): 27 min  Charges:    $Therapeutic Activity: 23-37 mins PT General Charges $$ ACUTE PT VISIT: 1 Visit                     Olga Coaster PT, DPT 9:56 AM,07/06/23

## 2023-07-06 NOTE — Care Management Important Message (Signed)
 Important Message  Patient Details  Name: Colin Barron MRN: [id number] Date of Birth: 06-Sep-1945   Important Message Given:  Yes - Medicare IM     Cristela Blue, CMA 07/06/2023, 10:38 AM

## 2023-07-06 NOTE — Plan of Care (Signed)

## 2023-07-07 DIAGNOSIS — R531 Weakness: Secondary | ICD-10-CM

## 2023-07-07 DIAGNOSIS — D6189 Other specified aplastic anemias and other bone marrow failure syndromes: Secondary | ICD-10-CM | POA: Diagnosis not present

## 2023-07-07 DIAGNOSIS — D469 Myelodysplastic syndrome, unspecified: Secondary | ICD-10-CM | POA: Diagnosis not present

## 2023-07-07 DIAGNOSIS — D696 Thrombocytopenia, unspecified: Secondary | ICD-10-CM | POA: Diagnosis not present

## 2023-07-07 DIAGNOSIS — R58 Hemorrhage, not elsewhere classified: Secondary | ICD-10-CM | POA: Diagnosis not present

## 2023-07-07 DIAGNOSIS — R410 Disorientation, unspecified: Secondary | ICD-10-CM

## 2023-07-07 LAB — CBC
HCT: 27.3 % — ABNORMAL LOW (ref 39.0–52.0)
Hemoglobin: 9.1 g/dL — ABNORMAL LOW (ref 13.0–17.0)
MCH: 34.6 pg — ABNORMAL HIGH (ref 26.0–34.0)
MCHC: 33.3 g/dL (ref 30.0–36.0)
MCV: 103.8 fL — ABNORMAL HIGH (ref 80.0–100.0)
Platelets: 19 10*3/uL — CL (ref 150–400)
RBC: 2.63 MIL/uL — ABNORMAL LOW (ref 4.22–5.81)
RDW: 17.5 % — ABNORMAL HIGH (ref 11.5–15.5)
WBC: 12.5 10*3/uL — ABNORMAL HIGH (ref 4.0–10.5)
nRBC: 0.2 % (ref 0.0–0.2)

## 2023-07-07 LAB — BASIC METABOLIC PANEL
Anion gap: 4 — ABNORMAL LOW (ref 5–15)
BUN: 19 mg/dL (ref 8–23)
CO2: 27 mmol/L (ref 22–32)
Calcium: 8.7 mg/dL — ABNORMAL LOW (ref 8.9–10.3)
Chloride: 106 mmol/L (ref 98–111)
Creatinine, Ser: 0.71 mg/dL (ref 0.61–1.24)
GFR, Estimated: >60 mL/min (ref >60)
Glucose, Bld: 97 mg/dL (ref 70–99)
Potassium: 3.9 mmol/L (ref 3.5–5.1)
Sodium: 137 mmol/L (ref 135–145)

## 2023-07-07 MED ORDER — SODIUM CHLORIDE 0.9% IV SOLUTION: Freq: Once | INTRAVENOUS | Status: AC

## 2023-07-07 NOTE — Plan of Care (Signed)
  Problem: Education: Goal: Knowledge of General Education information will improve Description: Including pain rating scale, medication(s)/side effects and non-pharmacologic comfort measures 07/07/2023 1659 by Serita Grit, RN Outcome: Adequate for Discharge 07/07/2023 1512 by Serita Grit, RN Outcome: Progressing   Problem: Health Behavior/Discharge Planning: Goal: Ability to manage health-related needs will improve 07/07/2023 1659 by Serita Grit, RN Outcome: Adequate for Discharge 07/07/2023 1512 by Serita Grit, RN Outcome: Progressing   Problem: Clinical Measurements: Goal: Ability to maintain clinical measurements within normal limits will improve 07/07/2023 1659 by Serita Grit, RN Outcome: Adequate for Discharge 07/07/2023 1512 by Serita Grit, RN Outcome: Progressing Goal: Will remain free from infection 07/07/2023 1659 by Serita Grit, RN Outcome: Adequate for Discharge 07/07/2023 1512 by Serita Grit, RN Outcome: Progressing Goal: Diagnostic test results will improve 07/07/2023 1659 by Serita Grit, RN Outcome: Adequate for Discharge 07/07/2023 1512 by Serita Grit, RN Outcome: Progressing Goal: Respiratory complications will improve 07/07/2023 1659 by Serita Grit, RN Outcome: Adequate for Discharge 07/07/2023 1512 by Serita Grit, RN Outcome: Progressing Goal: Cardiovascular complication will be avoided 07/07/2023 1659 by Serita Grit, RN Outcome: Adequate for Discharge 07/07/2023 1512 by Serita Grit, RN Outcome: Progressing   Problem: Activity: Goal: Risk for activity intolerance will decrease 07/07/2023 1659 by Serita Grit, RN Outcome: Adequate for Discharge 07/07/2023 1512 by Serita Grit, RN Outcome: Progressing   Problem: Nutrition: Goal: Adequate nutrition will be maintained 07/07/2023 1659 by Serita Grit, RN Outcome: Adequate for Discharge 07/07/2023 1512 by Serita Grit, RN Outcome: Progressing   Problem: Coping: Goal: Level of anxiety will decrease 07/07/2023 1659 by Serita Grit, RN Outcome: Adequate for Discharge 07/07/2023 1512 by Serita Grit, RN Outcome: Progressing   Problem: Elimination: Goal: Will not experience complications related to bowel motility 07/07/2023 1659 by Serita Grit, RN Outcome: Adequate for Discharge 07/07/2023 1512 by Serita Grit, RN Outcome: Progressing Goal: Will not experience complications related to urinary retention 07/07/2023 1659 by Serita Grit, RN Outcome: Adequate for Discharge 07/07/2023 1512 by Serita Grit, RN Outcome: Progressing   Problem: Pain Managment: Goal: General experience of comfort will improve and/or be controlled 07/07/2023 1659 by Serita Grit, RN Outcome: Adequate for Discharge 07/07/2023 1512 by Serita Grit, RN Outcome: Progressing   Problem: Safety: Goal: Ability to remain free from injury will improve 07/07/2023 1659 by Serita Grit, RN Outcome: Adequate for Discharge 07/07/2023 1512 by Serita Grit, RN Outcome: Progressing   Problem: Skin Integrity: Goal: Risk for impaired skin integrity will decrease 07/07/2023 1659 by Serita Grit, RN Outcome: Adequate for Discharge 07/07/2023 1512 by Serita Grit, RN Outcome: Progressing

## 2023-07-07 NOTE — TOC Progression Note (Addendum)
 Transition of Care [facility]) - Progression Note    Patient Details  Name: Colin Barron MRN: [id number] Date of Birth: Sep 09, 1945  Transition of Care [facility] CM/SW Contact  Truddie Hidden, RN Phone Number: 07/07/2023, 3:10 PM  Clinical Narrative:    Spoke with patient and his son regarding HHPT rec. Patient is agreeable to [facility]. He was provided choices for [facility] via [facility], [facility], and [facility]. Patient son chose [facility]. Patient advised the accepting agency will contact him directly to scheduled SOC within 48 post discharge.  Referral sent and accepted by [place] from [facility].          Expected Discharge Plan and Services         Expected Discharge Date: 07/05/23                                     Social Determinants of Health (SDOH) Interventions SDOH Screenings   Food Insecurity: No Food Insecurity (07/04/2023)  Housing: Low Risk  (07/04/2023)  Transportation Needs: No Transportation Needs (07/04/2023)  Utilities: Not At Risk (07/04/2023)  Alcohol Screen: Low Risk  (03/16/2019)  Depression (PHQ2-9): Low Risk  (04/22/2023)  Financial Resource Strain: Low Risk  (06/06/2023)   Received from [facility]  Physical Activity: Inactive (04/25/2023)   Received from [facility]  Social Connections: Moderately Integrated (07/04/2023)  Stress: Stress Concern Present (03/02/2022)  Tobacco Use: Low Risk  (07/02/2023)  Health Literacy: Low Risk  (06/06/2023)   Received from [facility]    Readmission Risk Interventions     No data to display

## 2023-07-07 NOTE — Plan of Care (Signed)

## 2023-07-07 NOTE — Progress Notes (Signed)
 AVS given and reviewed with patient and son. PIV removed. All questions answered.

## 2023-07-07 NOTE — Evaluation (Signed)
 Clinical/Bedside Swallow Evaluation Patient Details  Name: Colin Barron MRN: [id number] Date of Birth: [birth year]  Today's Date: 07/07/2023 Time: SLP Start Time (ACUTE ONLY): 1010 SLP Stop Time (ACUTE ONLY): 1055 SLP Time Calculation (min) (ACUTE ONLY): 45 min  Past Medical History:  Past Medical History:  Diagnosis Date   Arthritis    BPH (benign prostatic hyperplasia) 12/08/2016   Past Surgical History:  Past Surgical History:  Procedure Laterality Date   BIOPSY PROSTATE     CHOLECYSTECTOMY     CYST REMOVAL NECK     TONSILLECTOMY     HPI:  Pt is a 78 y.o. African-American male with medical history significant for MDS, currently being treated at [facility], osteoarthritis and BPH, who presented to the emergency room with acute onset of bleeding from his newly inserted port.  He had this inserted by IR at [facility] on 3//2025.  He stated that a nurse came out and change his dressings a couple of days ago.  His brother reported denied that they noticed the dressing was saturated with blood with no bleeding from outside the dressing.  He was noted to have some bruising to the right lateral ribs.  No nausea or vomiting or abdominal pain.  No fever or chills.  No cough or wheezing or hemoptysis.  No dysuria, oliguria or hematuria or flank pain.  No other bleeding diathesis.  He denies any recent falls or trauma.  He last received irrigated platelets transfusion just prior to port placement due to thrombocytopenia of 20 sticks.  He has not been on any blood thinners.  Chest Imaging: R chest port. There are few scattered streaky bibasilar linear appearing  opacities, favored to reflect atelectasis.   During this admit, NSG reported pt having diffficulty swallowing Pills w/ liquid; this has improved when swallowing Pills in Applesauce(a puree).    Assessment / Plan / Recommendation  Clinical Impression   Pt seen for BSE. Pt awake, verbal and followed instruction given Time. Min-appearing, slow follow  through. Son present in room.  On RA, afebrile. WBC trending down.  Pt appears to present w/ functional oropharyngeal phase swallow w/ No gross, overt oropharyngeal phase dysphagia noted, except min slower Oral Phase A-P transfer of boluses intermittently -- NSG also reported "swishing" of Pills and liquid in mouth yesterday. Pt consumed po trials given w/ No overt, clinical s/s of aspiration during po trials.  Pt appears at reduced risk for aspiration following general aspiration precautions. However, pt does have challenging factors that could impact oropharyngeal swallowing to include MDS/tx impact, deconditioning/weakness, and hospitalization. These factors can increase risk for dysphagia as well as decreased oral intake overall.   During po trials, pt consumed all consistencies given w/ no overt coughing, decline in vocal quality, or change in respiratory presentation during/post trials. No decline in O2 sats recorded. Oral phase appeared min slower post bolus acceptance w/ slight oral holding/hesitation of A-P transfer for swallowing = this was noted w/ liquids primarily. Similar oral phase issues noted w/ Pill swallowing yesterday w/ NSG; this has been remedied by putting Pills in Puree for ease of swallowing. NSG reported pt was tolerating this well; pt agreed. Pt exhibited grossly WFL, and more timely, bolus management and mastication w/ increased textured trials; more timely A-P transfer for swallowing and oral clearing = suspect increased input from textured foods.  OM Exam appeared [facility] w/ no unilateral oral weakness noted. Speech Clear, min low volume of speech. Pt fed self w/ setup support.   Recommend  continue a fairly Regular consistency diet w/ well-Cut meats, moistened foods; Thin liquids -- recommend CUP drinking for increased control/input. Pt should Hold Cup when drinking. Recommend general aspiration precautions, reduce distractions during meals(talking). Tray setup and sitting up  support for pt. Pills WHOLE in Puree for safer, easier swallowing -- pt has done this w/ NSG this shift, and it was encourged now and for D/C to the Son.  Education given on Pills in Puree; food consistencies and easy to eat options; general aspiration precautions; the impact of illness/hospitalization to pt and Son. No further skilled ST services indicated currently. MD/NSG to reconsult if any new needs arise. Both updated, agreed. Recommend Dietician f/u for support. SLP Visit Diagnosis: Dysphagia, unspecified (R13.10) (MIN oral phase slowness intermittently; reported difficulty swallowing Pills w/ liquids)    Aspiration Risk   (reduced following general aspiration precautions)    Diet Recommendation   Thin;Age appropriate regular (moist, cut foods = easy to eat for conservation of energy) = continue a fairly Regular consistency diet w/ well-Cut meats, moistened foods; Thin liquids -- recommend CUP drinking for increased control/input. Pt should Hold Cup when drinking. Recommend general aspiration precautions, reduce distractions during meals(talking). Tray setup and sitting up support for pt.   Medication Administration: Whole meds with puree    Other  Recommendations Recommended Consults:  (Dietician f/u) Oral Care Recommendations: Oral care BID;Oral care before and after PO;Patient independent with oral care (setup)    Recommendations for follow up therapy are one component of a multi-disciplinary discharge planning process, led by the attending physician.  Recommendations may be updated based on patient status, additional functional criteria and insurance authorization.  Follow up Recommendations No SLP follow up      Assistance Recommended at Discharge  Intermittent  Functional Status Assessment Patient has had a recent decline in their functional status and demonstrates the ability to make significant improvements in function in a reasonable and predictable amount of time.  Frequency  and Duration  (n/a)   (n/a)       Prognosis Prognosis for improved oropharyngeal function: Fair (-Good) Barriers/Prognosis Comment: baseline comorbidities      Swallow Study   General Date of Onset: 07/03/23 HPI: Pt is a 78 y.o. African-American male with medical history significant for MDS, currently being treated at [facility], osteoarthritis and BPH, who presented to the emergency room with acute onset of bleeding from his newly inserted port.  He had this inserted by IR at [facility] on 3//2025.  He stated that a nurse came out and change his dressings a couple of days ago.  His brother reported denied that they noticed the dressing was saturated with blood with no bleeding from outside the dressing.  He was noted to have some bruising to the right lateral ribs.  No nausea or vomiting or abdominal pain.  No fever or chills.  No cough or wheezing or hemoptysis.  No dysuria, oliguria or hematuria or flank pain.  No other bleeding diathesis.  He denies any recent falls or trauma.  He last received irrigated platelets transfusion just prior to port placement due to thrombocytopenia of 20 sticks.  He has not been on any blood thinners.  Chest Imaging: R chest port. There are few scattered streaky bibasilar linear appearing  opacities, favored to reflect atelectasis.   During this admit, NSG reported pt having diffficulty swallowing Pills w/ liquid; this has improved when swallowing Pills in Applesauce(a puree). Type of Study: Bedside Swallow Evaluation Previous Swallow Assessment: none Diet  Prior to this Study: Regular;Thin liquids (Level 0) Temperature Spikes Noted: No (wbc 12.5 trending down) Respiratory Status: Room air History of Recent Intubation: No Behavior/Cognition: Alert;Cooperative;Pleasant mood;Requires cueing (min slow follow through at times) Oral Cavity Assessment: Within Functional Limits Oral Care Completed by SLP: Recent completion by staff Oral Cavity - Dentition: Adequate natural  dentition Vision: Functional for self-feeding Self-Feeding Abilities: Needs assist;Needs set up Patient Positioning: Upright in bed (needed MOD support) Baseline Vocal Quality: Normal;Low vocal intensity Volitional Cough: Strong Volitional Swallow: Able to elicit    Oral/Motor/Sensory Function Overall Oral Motor/Sensory Function: Within functional limits (no unilateral weakness noted)   Ice Chips Ice chips: Within functional limits Presentation: Spoon (fed; 3 trials)   Thin Liquid Thin Liquid: Within functional limits Presentation: Cup;Self Fed (~4 ozs) Other Comments: water    Nectar Thick Nectar Thick Liquid: Not tested   Honey Thick Honey Thick Liquid: Not tested   Puree Puree: Not tested Other Comments: declined   Solid     Solid: Within functional limits Presentation: Self Fed (5 trials) Other Comments: declined applesauce to moisten        Jerilynn Som, MS, CCC-SLP Speech Language Pathologist Rehab Services; [facility] [phone] (ascom) Ashtynn Berke 07/07/2023,1:19 PM

## 2023-07-07 NOTE — Discharge Summary (Signed)
 Physician Discharge Summary   Patient: Colin Barron MRN: [id number] DOB: 08-27-1945  Admit date:     07/02/2023  Discharge date: 07/07/23  Discharge Physician: Marcelino Duster   PCP: Tally Joe, MD   Recommendations at discharge:  {Tip this will not be part of the note when signed- Example include specific recommendations for outpatient follow-up, pending tests to follow-up on. (Optional):26781}  PCP follow up in 1 week. Oncology follow up as scheduled.  Discharge Diagnoses: Principal Problem:   Severe thrombocytopenia (HCC) Active Problems:   Anemia   Gout   Bleeding   MDS (myelodysplastic syndrome) (HCC)   Orthostatic hypotension  Resolved Problems:   * No resolved hospital problems. *  Hospital Course: No notes on file  Assessment and Plan: * Severe thrombocytopenia (HCC) - This is associated with myelodysplastic syndrome. - The patient will be admitted to a progressive unit bed. - He will be transfused 2 units of irradiated platelets. - Hematology consult will be obtained. - I notified Dr. Donneta Romberg about the patient.  Anemia - She was typed and crossmatch and will be transfused 1 unit of packed red blood cells given associated bleeding with hemoglobin less than 8. - We will follow posttransfusion H&H.  Gout - We will continue allopurinol.      {Tip this will not be part of the note when signed Body mass index is 23.15 kg/m. , ,  (Optional):26781}  {(NOTE) Pain control PDMP Statment (Optional):26782} Consultants: *** Procedures performed: ***  Disposition: {Plan; Disposition:26390} Diet recommendation:  Discharge Diet Orders (From admission, onward)     Start     Ordered   07/05/23 0000  Diet - low sodium heart healthy        07/05/23 1304           {Diet_Plan:26776} DISCHARGE MEDICATION: Allergies as of 07/07/2023       Reactions   Amoxicillin Other (See Comments)   Tolerated Rocephin IV on 04/25/2021        Medication  List     TAKE these medications    allopurinol 300 MG tablet Commonly known as: ZYLOPRIM Take 1 tablet by mouth daily.   decitabine-cedazuridine 35-100 MG oral tablet Commonly known as: INQOVI Take 1 tablet by mouth daily. Take days 1-3 in each 28 day cycle   Systane 0.4-0.3 % Soln Generic drug: Polyethyl Glycol-Propyl Glycol Apply to eye daily.   valACYclovir 500 MG tablet Commonly known as: VALTREX Take 1 tablet by mouth daily.   VITAMIN D3 PO Take 1 tablet by mouth every other day.               Discharge Care Instructions  (From admission, onward)           Start     Ordered   07/07/23 0000  Leave dressing on - Keep it clean, dry, and intact until clinic visit        07/07/23 1544   07/07/23 0000  Leave dressing on - Keep it clean, dry, and intact until clinic visit        07/07/23 1548            Follow-up Information     Tally Joe, MD Follow up in 1 week(s).   Specialty: Family Medicine Contact information: [address] [place] [address] [phone]                Discharge Exam: Ceasar Mons Weights   07/03/23 1043  Weight: 75.3 kg   ***  Condition at discharge: {DC Condition:26389}  The results of significant diagnostics from this hospitalization (including imaging, microbiology, ancillary and laboratory) are listed below for reference.   Imaging Studies: CT HEAD WO CONTRAST ( ) Result Date: 07/06/2023 CLINICAL DATA:  Altered mental status EXAM: CT HEAD WITHOUT CONTRAST TECHNIQUE: Contiguous axial images were obtained from the base of the skull through the vertex without intravenous contrast. RADIATION DOSE REDUCTION: This exam was performed according to the departmental dose-optimization program which includes automated exposure control, adjustment of the mA and/or kV according to patient size and/or use of iterative reconstruction technique. COMPARISON:  None Available. FINDINGS: Brain: No mass,hemorrhage  or extra-axial collection. Normal appearance of the parenchyma and CSF spaces. Vascular: No hyperdense vessel or unexpected vascular calcification. Skull: The visualized skull base, calvarium and extracranial soft tissues are normal. Sinuses/Orbits: No fluid levels or advanced mucosal thickening of the visualized paranasal sinuses. No mastoid or middle ear effusion. Normal orbits. Other: None. IMPRESSION: Normal head CT. Electronically Signed   By: Deatra Robinson M.D.   On: 07/06/2023 19:27   DG Neck Soft Tissue Result Date: 07/03/2023 CLINICAL DATA:  9965 Edema 9965 EXAM: NECK SOFT TISSUES - 1 VIEW COMPARISON:  None Available. FINDINGS: Evaluation is limited by single AP view of the neck. Retropharyngeal soft tissues cannot be assessed on AP view. Atherosclerotic calcifications. Partial visualization of the port. IMPRESSION: Evaluation is limited by single AP view of the neck. Retropharyngeal soft tissues cannot be assessed on AP view. Electronically Signed   By: Meda Klinefelter M.D.   On: 07/03/2023 08:06   DG Chest Port 1 View Result Date: 07/03/2023 CLINICAL DATA:  [id number] Chest pain [id number].  History of sarcoidosis EXAM: PORTABLE CHEST 1 VIEW COMPARISON:  July 03, 2023, April 25, 2021 FINDINGS: The cardiomediastinal silhouette is unchanged in contour with enlarged hilar contours and prominent paratracheal stripe, consistent with history of sarcoidosis.RIGHT chest port with tip terminating over the superior cavoatrial junction. No pleural effusion. No pneumothorax. There are a few scattered streaky bibasilar linear appearing opacities. IMPRESSION: There are few scattered streaky bibasilar linear appearing opacities, favored to reflect atelectasis. Electronically Signed   By: Meda Klinefelter M.D.   On: 07/03/2023 08:05   DG Chest Portable 1 View Result Date: 07/03/2023 CLINICAL DATA:  Bleeding at the port site. Recently placed right chest port. EXAM: PORTABLE CHEST 1 VIEW COMPARISON:  Chest CT without  contrast 11/26/2021 FINDINGS: Cardiac size is normal. There is tortuosity, scattered calcific plaque with stable mediastinum. The lungs are hypoinflated but generally clear. No pneumothorax or pleural effusion is seen. There is new demonstration of a right chest port with IJ approach catheter terminating at the level of the superior cavoatrial junction. There is no visible break in the catheter or disconnection from the port. Thoracic cage is intact. IMPRESSION: 1. No acute radiographic chest findings. 2. Right chest port with IJ approach catheter terminating at the level of the superior cavoatrial junction. No visible break in the catheter or disconnection from the port. Electronically Signed   By: Almira Bar M.D.   On: 07/03/2023 01:30    Microbiology: Results for orders placed or performed during the hospital encounter of 07/02/23  MRSA Next Gen by PCR, Nasal     Status: None   Collection Time: 07/03/23 10:46 AM   Specimen: Nasal Mucosa; Nasal Swab  Result Value Ref Range Status   MRSA by PCR Next Gen NOT DETECTED NOT DETECTED Final    Comment: (NOTE) The GeneXpert MRSA Assay (FDA approved  for NASAL specimens only), is one component of a comprehensive MRSA colonization surveillance program. It is not intended to diagnose MRSA infection nor to guide or monitor treatment for MRSA infections. Test performance is not FDA approved in patients less than 98 years old. Performed at [facility], [address] [place], [place] [address]     Labs: CBC: Recent Labs  Lab 07/03/23 [id number] 07/03/23 0502 07/04/23 4098 07/05/23 0445 07/06/23 0620 07/06/23 1834 07/07/23 0550  WBC 9.0   < > 3.4* 9.4 9.5 15.8* 12.5*  NEUTROABS 5.4  --   --   --   --   --   --   HGB 7.4*   < > 8.0* 8.4* 7.7* 9.2* 9.1*  HCT 23.1*   < > 24.2* 24.8* 23.5* 28.2* 27.3*  MCV 109.5*   < > 101.3* 100.8* 104.0* 104.1* 103.8*  PLT 12*   < > 42* 37* 21* 21* 19*   < > = values in this interval not displayed.    Basic Metabolic Panel: Recent Labs  Lab 07/03/23 0039 07/03/23 0502 07/04/23 0337 07/05/23 0445 07/07/23 0550  NA 133* 136 136 137 137  K 4.1 3.9 4.0 4.1 3.9  CL 103 105 104 104 106  CO2 25 25 25 25 27   GLUCOSE 110* 94 138* 140* 97  BUN 18 18 14 23 19   CREATININE 0.82 0.76 0.68 0.79 0.71  CALCIUM 8.7* 8.6* 8.8* 9.2 8.7*   Liver Function Tests: Recent Labs  Lab 07/03/23 0039  AST 14*  ALT 10  ALKPHOS 51  BILITOT 1.2  PROT 6.8  ALBUMIN 3.6   CBG: Recent Labs  Lab 07/03/23 1044  GLUCAP 116*    Discharge time spent: {LESS THAN/GREATER THAN:26388} 30 minutes.  Signed: Marcelino Duster, MD [facility] 07/07/2023

## 2023-07-08 LAB — BPAM PLATELET PHERESIS
Blood Product Expiration Date: 202503172359
ISSUE DATE / TIME: 202503131326
Unit Type and Rh: 5100

## 2023-07-08 LAB — PREPARE PLATELET PHERESIS: Unit division: 0

## 2023-07-11 DIAGNOSIS — D860 Sarcoidosis of lung: Secondary | ICD-10-CM | POA: Diagnosis not present

## 2023-07-11 DIAGNOSIS — Z79899 Other long term (current) drug therapy: Secondary | ICD-10-CM | POA: Diagnosis not present

## 2023-07-11 DIAGNOSIS — R5383 Other fatigue: Secondary | ICD-10-CM | POA: Diagnosis not present

## 2023-07-11 DIAGNOSIS — D8689 Sarcoidosis of other sites: Secondary | ICD-10-CM | POA: Diagnosis not present

## 2023-07-11 DIAGNOSIS — R531 Weakness: Secondary | ICD-10-CM | POA: Diagnosis not present

## 2023-07-11 DIAGNOSIS — D469 Myelodysplastic syndrome, unspecified: Secondary | ICD-10-CM | POA: Diagnosis not present

## 2023-07-11 DIAGNOSIS — D696 Thrombocytopenia, unspecified: Secondary | ICD-10-CM | POA: Diagnosis not present

## 2023-07-14 DIAGNOSIS — C9200 Acute myeloblastic leukemia, not having achieved remission: Secondary | ICD-10-CM | POA: Diagnosis not present

## 2023-07-14 DIAGNOSIS — D469 Myelodysplastic syndrome, unspecified: Secondary | ICD-10-CM | POA: Diagnosis not present

## 2023-07-18 DIAGNOSIS — Z79899 Other long term (current) drug therapy: Secondary | ICD-10-CM | POA: Diagnosis not present

## 2023-07-18 DIAGNOSIS — D469 Myelodysplastic syndrome, unspecified: Secondary | ICD-10-CM | POA: Diagnosis not present

## 2023-07-18 DIAGNOSIS — R531 Weakness: Secondary | ICD-10-CM | POA: Diagnosis not present

## 2023-07-18 DIAGNOSIS — Z887 Allergy status to serum and vaccine status: Secondary | ICD-10-CM | POA: Diagnosis not present

## 2023-07-18 DIAGNOSIS — Z880 Allergy status to penicillin: Secondary | ICD-10-CM | POA: Diagnosis not present

## 2023-07-18 DIAGNOSIS — Z8042 Family history of malignant neoplasm of prostate: Secondary | ICD-10-CM | POA: Diagnosis not present

## 2023-07-18 DIAGNOSIS — D696 Thrombocytopenia, unspecified: Secondary | ICD-10-CM | POA: Diagnosis not present

## 2023-07-19 DIAGNOSIS — M1990 Unspecified osteoarthritis, unspecified site: Secondary | ICD-10-CM | POA: Diagnosis not present

## 2023-07-19 DIAGNOSIS — D62 Acute posthemorrhagic anemia: Secondary | ICD-10-CM | POA: Diagnosis not present

## 2023-07-19 DIAGNOSIS — M109 Gout, unspecified: Secondary | ICD-10-CM | POA: Diagnosis not present

## 2023-07-19 DIAGNOSIS — D696 Thrombocytopenia, unspecified: Secondary | ICD-10-CM | POA: Diagnosis not present

## 2023-07-19 DIAGNOSIS — I951 Orthostatic hypotension: Secondary | ICD-10-CM | POA: Diagnosis not present

## 2023-07-19 DIAGNOSIS — D469 Myelodysplastic syndrome, unspecified: Secondary | ICD-10-CM | POA: Diagnosis not present

## 2023-07-19 DIAGNOSIS — N401 Enlarged prostate with lower urinary tract symptoms: Secondary | ICD-10-CM | POA: Diagnosis not present

## 2023-07-19 DIAGNOSIS — I700 Atherosclerosis of aorta: Secondary | ICD-10-CM | POA: Diagnosis not present

## 2023-07-19 DIAGNOSIS — D869 Sarcoidosis, unspecified: Secondary | ICD-10-CM | POA: Diagnosis not present

## 2023-07-21 DIAGNOSIS — D696 Thrombocytopenia, unspecified: Secondary | ICD-10-CM | POA: Diagnosis not present

## 2023-07-21 DIAGNOSIS — D469 Myelodysplastic syndrome, unspecified: Secondary | ICD-10-CM | POA: Diagnosis not present

## 2023-07-21 DIAGNOSIS — C9200 Acute myeloblastic leukemia, not having achieved remission: Secondary | ICD-10-CM | POA: Diagnosis not present

## 2023-07-25 DIAGNOSIS — R5383 Other fatigue: Secondary | ICD-10-CM | POA: Diagnosis not present

## 2023-07-25 DIAGNOSIS — R531 Weakness: Secondary | ICD-10-CM | POA: Diagnosis not present

## 2023-07-25 DIAGNOSIS — C9200 Acute myeloblastic leukemia, not having achieved remission: Secondary | ICD-10-CM | POA: Diagnosis not present

## 2023-07-25 DIAGNOSIS — D696 Thrombocytopenia, unspecified: Secondary | ICD-10-CM | POA: Diagnosis not present

## 2023-07-26 DIAGNOSIS — D469 Myelodysplastic syndrome, unspecified: Secondary | ICD-10-CM | POA: Diagnosis not present

## 2023-07-26 DIAGNOSIS — D860 Sarcoidosis of lung: Secondary | ICD-10-CM | POA: Diagnosis not present

## 2023-07-26 DIAGNOSIS — D696 Thrombocytopenia, unspecified: Secondary | ICD-10-CM | POA: Diagnosis not present

## 2023-07-26 DIAGNOSIS — R5381 Other malaise: Secondary | ICD-10-CM | POA: Diagnosis not present

## 2023-07-28 DIAGNOSIS — D696 Thrombocytopenia, unspecified: Secondary | ICD-10-CM | POA: Diagnosis not present

## 2023-07-28 DIAGNOSIS — D649 Anemia, unspecified: Secondary | ICD-10-CM | POA: Diagnosis not present

## 2023-07-29 DIAGNOSIS — D696 Thrombocytopenia, unspecified: Secondary | ICD-10-CM | POA: Diagnosis not present

## 2023-07-29 DIAGNOSIS — M109 Gout, unspecified: Secondary | ICD-10-CM | POA: Diagnosis not present

## 2023-07-29 DIAGNOSIS — M1990 Unspecified osteoarthritis, unspecified site: Secondary | ICD-10-CM | POA: Diagnosis not present

## 2023-07-29 DIAGNOSIS — D62 Acute posthemorrhagic anemia: Secondary | ICD-10-CM | POA: Diagnosis not present

## 2023-07-29 DIAGNOSIS — I951 Orthostatic hypotension: Secondary | ICD-10-CM | POA: Diagnosis not present

## 2023-07-29 DIAGNOSIS — D869 Sarcoidosis, unspecified: Secondary | ICD-10-CM | POA: Diagnosis not present

## 2023-07-29 DIAGNOSIS — I700 Atherosclerosis of aorta: Secondary | ICD-10-CM | POA: Diagnosis not present

## 2023-07-29 DIAGNOSIS — N401 Enlarged prostate with lower urinary tract symptoms: Secondary | ICD-10-CM | POA: Diagnosis not present

## 2023-07-29 DIAGNOSIS — D469 Myelodysplastic syndrome, unspecified: Secondary | ICD-10-CM | POA: Diagnosis not present

## 2023-08-01 DIAGNOSIS — D696 Thrombocytopenia, unspecified: Secondary | ICD-10-CM | POA: Diagnosis not present

## 2023-08-01 DIAGNOSIS — D649 Anemia, unspecified: Secondary | ICD-10-CM | POA: Diagnosis not present

## 2023-08-02 DIAGNOSIS — Z95828 Presence of other vascular implants and grafts: Secondary | ICD-10-CM | POA: Diagnosis not present

## 2023-08-04 DIAGNOSIS — D696 Thrombocytopenia, unspecified: Secondary | ICD-10-CM | POA: Diagnosis not present

## 2023-08-04 DIAGNOSIS — D649 Anemia, unspecified: Secondary | ICD-10-CM | POA: Diagnosis not present

## 2023-08-09 DIAGNOSIS — R531 Weakness: Secondary | ICD-10-CM | POA: Diagnosis not present

## 2023-08-09 DIAGNOSIS — Z880 Allergy status to penicillin: Secondary | ICD-10-CM | POA: Diagnosis not present

## 2023-08-09 DIAGNOSIS — R918 Other nonspecific abnormal finding of lung field: Secondary | ICD-10-CM | POA: Diagnosis not present

## 2023-08-09 DIAGNOSIS — D696 Thrombocytopenia, unspecified: Secondary | ICD-10-CM | POA: Diagnosis not present

## 2023-08-09 DIAGNOSIS — D869 Sarcoidosis, unspecified: Secondary | ICD-10-CM | POA: Diagnosis not present

## 2023-08-09 DIAGNOSIS — C9200 Acute myeloblastic leukemia, not having achieved remission: Secondary | ICD-10-CM | POA: Diagnosis not present

## 2023-08-09 DIAGNOSIS — R0781 Pleurodynia: Secondary | ICD-10-CM | POA: Diagnosis not present

## 2023-08-09 DIAGNOSIS — R5383 Other fatigue: Secondary | ICD-10-CM | POA: Diagnosis not present

## 2023-08-09 DIAGNOSIS — Z5111 Encounter for antineoplastic chemotherapy: Secondary | ICD-10-CM | POA: Diagnosis not present

## 2023-08-10 DIAGNOSIS — R531 Weakness: Secondary | ICD-10-CM | POA: Diagnosis not present

## 2023-08-10 DIAGNOSIS — R5383 Other fatigue: Secondary | ICD-10-CM | POA: Diagnosis not present

## 2023-08-10 DIAGNOSIS — Z5111 Encounter for antineoplastic chemotherapy: Secondary | ICD-10-CM | POA: Diagnosis not present

## 2023-08-10 DIAGNOSIS — C9200 Acute myeloblastic leukemia, not having achieved remission: Secondary | ICD-10-CM | POA: Diagnosis not present

## 2023-08-11 DIAGNOSIS — Z5111 Encounter for antineoplastic chemotherapy: Secondary | ICD-10-CM | POA: Diagnosis not present

## 2023-08-11 DIAGNOSIS — R5383 Other fatigue: Secondary | ICD-10-CM | POA: Diagnosis not present

## 2023-08-11 DIAGNOSIS — R531 Weakness: Secondary | ICD-10-CM | POA: Diagnosis not present

## 2023-08-11 DIAGNOSIS — C9200 Acute myeloblastic leukemia, not having achieved remission: Secondary | ICD-10-CM | POA: Diagnosis not present

## 2023-08-12 DIAGNOSIS — R5383 Other fatigue: Secondary | ICD-10-CM | POA: Diagnosis not present

## 2023-08-12 DIAGNOSIS — C9200 Acute myeloblastic leukemia, not having achieved remission: Secondary | ICD-10-CM | POA: Diagnosis not present

## 2023-08-12 DIAGNOSIS — R531 Weakness: Secondary | ICD-10-CM | POA: Diagnosis not present

## 2023-08-12 DIAGNOSIS — Z5111 Encounter for antineoplastic chemotherapy: Secondary | ICD-10-CM | POA: Diagnosis not present

## 2023-08-13 DIAGNOSIS — R5383 Other fatigue: Secondary | ICD-10-CM | POA: Diagnosis not present

## 2023-08-13 DIAGNOSIS — R531 Weakness: Secondary | ICD-10-CM | POA: Diagnosis not present

## 2023-08-13 DIAGNOSIS — C9200 Acute myeloblastic leukemia, not having achieved remission: Secondary | ICD-10-CM | POA: Diagnosis not present

## 2023-08-13 DIAGNOSIS — Z5111 Encounter for antineoplastic chemotherapy: Secondary | ICD-10-CM | POA: Diagnosis not present

## 2023-08-15 DIAGNOSIS — K8020 Calculus of gallbladder without cholecystitis without obstruction: Secondary | ICD-10-CM | POA: Diagnosis not present

## 2023-08-15 DIAGNOSIS — D692 Other nonthrombocytopenic purpura: Secondary | ICD-10-CM | POA: Diagnosis not present

## 2023-08-15 DIAGNOSIS — N3000 Acute cystitis without hematuria: Secondary | ICD-10-CM | POA: Diagnosis not present

## 2023-08-15 DIAGNOSIS — R21 Rash and other nonspecific skin eruption: Secondary | ICD-10-CM | POA: Diagnosis not present

## 2023-08-15 DIAGNOSIS — N390 Urinary tract infection, site not specified: Secondary | ICD-10-CM | POA: Diagnosis not present

## 2023-08-15 DIAGNOSIS — C9200 Acute myeloblastic leukemia, not having achieved remission: Secondary | ICD-10-CM | POA: Diagnosis not present

## 2023-08-15 DIAGNOSIS — R58 Hemorrhage, not elsewhere classified: Secondary | ICD-10-CM | POA: Diagnosis not present

## 2023-08-15 DIAGNOSIS — R233 Spontaneous ecchymoses: Secondary | ICD-10-CM | POA: Diagnosis not present

## 2023-08-15 DIAGNOSIS — D869 Sarcoidosis, unspecified: Secondary | ICD-10-CM | POA: Diagnosis not present

## 2023-08-15 DIAGNOSIS — D696 Thrombocytopenia, unspecified: Secondary | ICD-10-CM | POA: Diagnosis not present

## 2023-08-15 DIAGNOSIS — N3289 Other specified disorders of bladder: Secondary | ICD-10-CM | POA: Diagnosis not present

## 2023-08-15 DIAGNOSIS — N400 Enlarged prostate without lower urinary tract symptoms: Secondary | ICD-10-CM | POA: Diagnosis not present

## 2023-08-16 DIAGNOSIS — K8020 Calculus of gallbladder without cholecystitis without obstruction: Secondary | ICD-10-CM | POA: Diagnosis not present

## 2023-08-16 DIAGNOSIS — R404 Transient alteration of awareness: Secondary | ICD-10-CM | POA: Diagnosis not present

## 2023-08-16 DIAGNOSIS — T451X5A Adverse effect of antineoplastic and immunosuppressive drugs, initial encounter: Secondary | ICD-10-CM | POA: Diagnosis not present

## 2023-08-16 DIAGNOSIS — S301XXA Contusion of abdominal wall, initial encounter: Secondary | ICD-10-CM | POA: Diagnosis not present

## 2023-08-16 DIAGNOSIS — N3090 Cystitis, unspecified without hematuria: Secondary | ICD-10-CM | POA: Diagnosis not present

## 2023-08-16 DIAGNOSIS — G9340 Encephalopathy, unspecified: Secondary | ICD-10-CM | POA: Diagnosis not present

## 2023-08-16 DIAGNOSIS — D84821 Immunodeficiency due to drugs: Secondary | ICD-10-CM | POA: Diagnosis not present

## 2023-08-16 DIAGNOSIS — R233 Spontaneous ecchymoses: Secondary | ICD-10-CM | POA: Diagnosis not present

## 2023-08-16 DIAGNOSIS — C9200 Acute myeloblastic leukemia, not having achieved remission: Secondary | ICD-10-CM | POA: Diagnosis not present

## 2023-08-16 DIAGNOSIS — Z5111 Encounter for antineoplastic chemotherapy: Secondary | ICD-10-CM | POA: Diagnosis not present

## 2023-08-16 DIAGNOSIS — N3289 Other specified disorders of bladder: Secondary | ICD-10-CM | POA: Diagnosis not present

## 2023-08-17 DIAGNOSIS — R531 Weakness: Secondary | ICD-10-CM | POA: Diagnosis not present

## 2023-08-17 DIAGNOSIS — R5383 Other fatigue: Secondary | ICD-10-CM | POA: Diagnosis not present

## 2023-08-17 DIAGNOSIS — Z5111 Encounter for antineoplastic chemotherapy: Secondary | ICD-10-CM | POA: Diagnosis not present

## 2023-08-17 DIAGNOSIS — C9200 Acute myeloblastic leukemia, not having achieved remission: Secondary | ICD-10-CM | POA: Diagnosis not present

## 2023-08-18 DIAGNOSIS — I700 Atherosclerosis of aorta: Secondary | ICD-10-CM | POA: Diagnosis not present

## 2023-08-18 DIAGNOSIS — N401 Enlarged prostate with lower urinary tract symptoms: Secondary | ICD-10-CM | POA: Diagnosis not present

## 2023-08-18 DIAGNOSIS — D696 Thrombocytopenia, unspecified: Secondary | ICD-10-CM | POA: Diagnosis not present

## 2023-08-18 DIAGNOSIS — D869 Sarcoidosis, unspecified: Secondary | ICD-10-CM | POA: Diagnosis not present

## 2023-08-18 DIAGNOSIS — I951 Orthostatic hypotension: Secondary | ICD-10-CM | POA: Diagnosis not present

## 2023-08-18 DIAGNOSIS — D62 Acute posthemorrhagic anemia: Secondary | ICD-10-CM | POA: Diagnosis not present

## 2023-08-18 DIAGNOSIS — M109 Gout, unspecified: Secondary | ICD-10-CM | POA: Diagnosis not present

## 2023-08-18 DIAGNOSIS — M1990 Unspecified osteoarthritis, unspecified site: Secondary | ICD-10-CM | POA: Diagnosis not present

## 2023-08-18 DIAGNOSIS — D469 Myelodysplastic syndrome, unspecified: Secondary | ICD-10-CM | POA: Diagnosis not present

## 2023-08-19 DIAGNOSIS — I951 Orthostatic hypotension: Secondary | ICD-10-CM | POA: Diagnosis not present

## 2023-08-19 DIAGNOSIS — D469 Myelodysplastic syndrome, unspecified: Secondary | ICD-10-CM | POA: Diagnosis not present

## 2023-08-19 DIAGNOSIS — D696 Thrombocytopenia, unspecified: Secondary | ICD-10-CM | POA: Diagnosis not present

## 2023-08-19 DIAGNOSIS — I700 Atherosclerosis of aorta: Secondary | ICD-10-CM | POA: Diagnosis not present

## 2023-08-19 DIAGNOSIS — M109 Gout, unspecified: Secondary | ICD-10-CM | POA: Diagnosis not present

## 2023-08-19 DIAGNOSIS — D62 Acute posthemorrhagic anemia: Secondary | ICD-10-CM | POA: Diagnosis not present

## 2023-08-19 DIAGNOSIS — M1990 Unspecified osteoarthritis, unspecified site: Secondary | ICD-10-CM | POA: Diagnosis not present

## 2023-08-19 DIAGNOSIS — D649 Anemia, unspecified: Secondary | ICD-10-CM | POA: Diagnosis not present

## 2023-08-19 DIAGNOSIS — N401 Enlarged prostate with lower urinary tract symptoms: Secondary | ICD-10-CM | POA: Diagnosis not present

## 2023-08-19 DIAGNOSIS — D869 Sarcoidosis, unspecified: Secondary | ICD-10-CM | POA: Diagnosis not present

## 2023-08-21 DIAGNOSIS — C9200 Acute myeloblastic leukemia, not having achieved remission: Secondary | ICD-10-CM | POA: Diagnosis not present

## 2023-08-21 DIAGNOSIS — D696 Thrombocytopenia, unspecified: Secondary | ICD-10-CM | POA: Diagnosis not present

## 2023-08-21 DIAGNOSIS — R531 Weakness: Secondary | ICD-10-CM | POA: Diagnosis not present

## 2023-08-21 DIAGNOSIS — R5383 Other fatigue: Secondary | ICD-10-CM | POA: Diagnosis not present

## 2023-08-22 DIAGNOSIS — I951 Orthostatic hypotension: Secondary | ICD-10-CM | POA: Diagnosis not present

## 2023-08-22 DIAGNOSIS — N401 Enlarged prostate with lower urinary tract symptoms: Secondary | ICD-10-CM | POA: Diagnosis not present

## 2023-08-22 DIAGNOSIS — M1990 Unspecified osteoarthritis, unspecified site: Secondary | ICD-10-CM | POA: Diagnosis not present

## 2023-08-22 DIAGNOSIS — I700 Atherosclerosis of aorta: Secondary | ICD-10-CM | POA: Diagnosis not present

## 2023-08-22 DIAGNOSIS — D469 Myelodysplastic syndrome, unspecified: Secondary | ICD-10-CM | POA: Diagnosis not present

## 2023-08-22 DIAGNOSIS — D62 Acute posthemorrhagic anemia: Secondary | ICD-10-CM | POA: Diagnosis not present

## 2023-08-22 DIAGNOSIS — D869 Sarcoidosis, unspecified: Secondary | ICD-10-CM | POA: Diagnosis not present

## 2023-08-22 DIAGNOSIS — D696 Thrombocytopenia, unspecified: Secondary | ICD-10-CM | POA: Diagnosis not present

## 2023-08-22 DIAGNOSIS — M109 Gout, unspecified: Secondary | ICD-10-CM | POA: Diagnosis not present

## 2023-08-23 DIAGNOSIS — R0781 Pleurodynia: Secondary | ICD-10-CM | POA: Diagnosis not present

## 2023-08-23 DIAGNOSIS — Z79899 Other long term (current) drug therapy: Secondary | ICD-10-CM | POA: Diagnosis not present

## 2023-08-23 DIAGNOSIS — D860 Sarcoidosis of lung: Secondary | ICD-10-CM | POA: Diagnosis not present

## 2023-08-23 DIAGNOSIS — C9200 Acute myeloblastic leukemia, not having achieved remission: Secondary | ICD-10-CM | POA: Diagnosis not present

## 2023-08-23 DIAGNOSIS — D8689 Sarcoidosis of other sites: Secondary | ICD-10-CM | POA: Diagnosis not present

## 2023-08-26 DIAGNOSIS — D649 Anemia, unspecified: Secondary | ICD-10-CM | POA: Diagnosis not present

## 2023-08-26 DIAGNOSIS — D696 Thrombocytopenia, unspecified: Secondary | ICD-10-CM | POA: Diagnosis not present

## 2023-08-29 DIAGNOSIS — D696 Thrombocytopenia, unspecified: Secondary | ICD-10-CM | POA: Diagnosis not present

## 2023-08-29 DIAGNOSIS — D649 Anemia, unspecified: Secondary | ICD-10-CM | POA: Diagnosis not present

## 2023-08-30 DIAGNOSIS — C9200 Acute myeloblastic leukemia, not having achieved remission: Secondary | ICD-10-CM | POA: Diagnosis not present

## 2023-08-30 DIAGNOSIS — D696 Thrombocytopenia, unspecified: Secondary | ICD-10-CM | POA: Diagnosis not present

## 2023-08-30 DIAGNOSIS — D469 Myelodysplastic syndrome, unspecified: Secondary | ICD-10-CM | POA: Diagnosis not present

## 2023-09-01 DIAGNOSIS — R42 Dizziness and giddiness: Secondary | ICD-10-CM | POA: Diagnosis not present

## 2023-09-01 DIAGNOSIS — D696 Thrombocytopenia, unspecified: Secondary | ICD-10-CM | POA: Diagnosis not present

## 2023-09-01 DIAGNOSIS — R531 Weakness: Secondary | ICD-10-CM | POA: Diagnosis not present

## 2023-09-01 DIAGNOSIS — D469 Myelodysplastic syndrome, unspecified: Secondary | ICD-10-CM | POA: Diagnosis not present

## 2023-09-01 DIAGNOSIS — R5383 Other fatigue: Secondary | ICD-10-CM | POA: Diagnosis not present

## 2023-09-01 DIAGNOSIS — C9200 Acute myeloblastic leukemia, not having achieved remission: Secondary | ICD-10-CM | POA: Diagnosis not present

## 2023-09-01 DIAGNOSIS — E861 Hypovolemia: Secondary | ICD-10-CM | POA: Diagnosis not present

## 2023-09-02 DIAGNOSIS — D62 Acute posthemorrhagic anemia: Secondary | ICD-10-CM | POA: Diagnosis not present

## 2023-09-02 DIAGNOSIS — M109 Gout, unspecified: Secondary | ICD-10-CM | POA: Diagnosis not present

## 2023-09-02 DIAGNOSIS — I700 Atherosclerosis of aorta: Secondary | ICD-10-CM | POA: Diagnosis not present

## 2023-09-02 DIAGNOSIS — N401 Enlarged prostate with lower urinary tract symptoms: Secondary | ICD-10-CM | POA: Diagnosis not present

## 2023-09-02 DIAGNOSIS — D696 Thrombocytopenia, unspecified: Secondary | ICD-10-CM | POA: Diagnosis not present

## 2023-09-02 DIAGNOSIS — M1990 Unspecified osteoarthritis, unspecified site: Secondary | ICD-10-CM | POA: Diagnosis not present

## 2023-09-02 DIAGNOSIS — D469 Myelodysplastic syndrome, unspecified: Secondary | ICD-10-CM | POA: Diagnosis not present

## 2023-09-02 DIAGNOSIS — D869 Sarcoidosis, unspecified: Secondary | ICD-10-CM | POA: Diagnosis not present

## 2023-09-02 DIAGNOSIS — I951 Orthostatic hypotension: Secondary | ICD-10-CM | POA: Diagnosis not present

## 2023-09-05 DIAGNOSIS — D860 Sarcoidosis of lung: Secondary | ICD-10-CM | POA: Diagnosis not present

## 2023-09-05 DIAGNOSIS — R531 Weakness: Secondary | ICD-10-CM | POA: Diagnosis not present

## 2023-09-05 DIAGNOSIS — C9200 Acute myeloblastic leukemia, not having achieved remission: Secondary | ICD-10-CM | POA: Diagnosis not present

## 2023-09-05 DIAGNOSIS — R5383 Other fatigue: Secondary | ICD-10-CM | POA: Diagnosis not present

## 2023-09-05 DIAGNOSIS — C9201 Acute myeloblastic leukemia, in remission: Secondary | ICD-10-CM | POA: Diagnosis not present

## 2023-09-09 DIAGNOSIS — D469 Myelodysplastic syndrome, unspecified: Secondary | ICD-10-CM | POA: Diagnosis not present

## 2023-09-09 DIAGNOSIS — M1990 Unspecified osteoarthritis, unspecified site: Secondary | ICD-10-CM | POA: Diagnosis not present

## 2023-09-09 DIAGNOSIS — I951 Orthostatic hypotension: Secondary | ICD-10-CM | POA: Diagnosis not present

## 2023-09-09 DIAGNOSIS — D696 Thrombocytopenia, unspecified: Secondary | ICD-10-CM | POA: Diagnosis not present

## 2023-09-09 DIAGNOSIS — N401 Enlarged prostate with lower urinary tract symptoms: Secondary | ICD-10-CM | POA: Diagnosis not present

## 2023-09-09 DIAGNOSIS — I700 Atherosclerosis of aorta: Secondary | ICD-10-CM | POA: Diagnosis not present

## 2023-09-09 DIAGNOSIS — D869 Sarcoidosis, unspecified: Secondary | ICD-10-CM | POA: Diagnosis not present

## 2023-09-09 DIAGNOSIS — M109 Gout, unspecified: Secondary | ICD-10-CM | POA: Diagnosis not present

## 2023-09-09 DIAGNOSIS — D62 Acute posthemorrhagic anemia: Secondary | ICD-10-CM | POA: Diagnosis not present

## 2023-09-12 DIAGNOSIS — D869 Sarcoidosis, unspecified: Secondary | ICD-10-CM | POA: Diagnosis not present

## 2023-09-12 DIAGNOSIS — D469 Myelodysplastic syndrome, unspecified: Secondary | ICD-10-CM | POA: Diagnosis not present

## 2023-09-12 DIAGNOSIS — R531 Weakness: Secondary | ICD-10-CM | POA: Diagnosis not present

## 2023-09-12 DIAGNOSIS — Z79899 Other long term (current) drug therapy: Secondary | ICD-10-CM | POA: Diagnosis not present

## 2023-09-12 DIAGNOSIS — Z880 Allergy status to penicillin: Secondary | ICD-10-CM | POA: Diagnosis not present

## 2023-09-12 DIAGNOSIS — C9200 Acute myeloblastic leukemia, not having achieved remission: Secondary | ICD-10-CM | POA: Diagnosis not present

## 2023-09-12 DIAGNOSIS — R634 Abnormal weight loss: Secondary | ICD-10-CM | POA: Diagnosis not present

## 2023-09-12 DIAGNOSIS — Z515 Encounter for palliative care: Secondary | ICD-10-CM | POA: Diagnosis not present

## 2023-09-12 DIAGNOSIS — D696 Thrombocytopenia, unspecified: Secondary | ICD-10-CM | POA: Diagnosis not present

## 2023-09-12 DIAGNOSIS — R5383 Other fatigue: Secondary | ICD-10-CM | POA: Diagnosis not present

## 2023-09-12 DIAGNOSIS — Z7189 Other specified counseling: Secondary | ICD-10-CM | POA: Diagnosis not present

## 2023-09-13 DIAGNOSIS — R5383 Other fatigue: Secondary | ICD-10-CM | POA: Diagnosis not present

## 2023-09-13 DIAGNOSIS — Z5111 Encounter for antineoplastic chemotherapy: Secondary | ICD-10-CM | POA: Diagnosis not present

## 2023-09-13 DIAGNOSIS — R531 Weakness: Secondary | ICD-10-CM | POA: Diagnosis not present

## 2023-09-13 DIAGNOSIS — C9200 Acute myeloblastic leukemia, not having achieved remission: Secondary | ICD-10-CM | POA: Diagnosis not present

## 2023-09-14 DIAGNOSIS — M1990 Unspecified osteoarthritis, unspecified site: Secondary | ICD-10-CM | POA: Diagnosis not present

## 2023-09-14 DIAGNOSIS — D62 Acute posthemorrhagic anemia: Secondary | ICD-10-CM | POA: Diagnosis not present

## 2023-09-14 DIAGNOSIS — D869 Sarcoidosis, unspecified: Secondary | ICD-10-CM | POA: Diagnosis not present

## 2023-09-14 DIAGNOSIS — D696 Thrombocytopenia, unspecified: Secondary | ICD-10-CM | POA: Diagnosis not present

## 2023-09-14 DIAGNOSIS — Z5111 Encounter for antineoplastic chemotherapy: Secondary | ICD-10-CM | POA: Diagnosis not present

## 2023-09-14 DIAGNOSIS — C9200 Acute myeloblastic leukemia, not having achieved remission: Secondary | ICD-10-CM | POA: Diagnosis not present

## 2023-09-14 DIAGNOSIS — Z880 Allergy status to penicillin: Secondary | ICD-10-CM | POA: Diagnosis not present

## 2023-09-14 DIAGNOSIS — D469 Myelodysplastic syndrome, unspecified: Secondary | ICD-10-CM | POA: Diagnosis not present

## 2023-09-14 DIAGNOSIS — C92A0 Acute myeloid leukemia with multilineage dysplasia, not having achieved remission: Secondary | ICD-10-CM | POA: Diagnosis not present

## 2023-09-14 DIAGNOSIS — Z792 Long term (current) use of antibiotics: Secondary | ICD-10-CM | POA: Diagnosis not present

## 2023-09-14 DIAGNOSIS — I959 Hypotension, unspecified: Secondary | ICD-10-CM | POA: Diagnosis not present

## 2023-09-14 DIAGNOSIS — Z79899 Other long term (current) drug therapy: Secondary | ICD-10-CM | POA: Diagnosis not present

## 2023-09-14 DIAGNOSIS — I951 Orthostatic hypotension: Secondary | ICD-10-CM | POA: Diagnosis not present

## 2023-09-14 DIAGNOSIS — N400 Enlarged prostate without lower urinary tract symptoms: Secondary | ICD-10-CM | POA: Diagnosis not present

## 2023-09-14 DIAGNOSIS — N401 Enlarged prostate with lower urinary tract symptoms: Secondary | ICD-10-CM | POA: Diagnosis not present

## 2023-09-14 DIAGNOSIS — I700 Atherosclerosis of aorta: Secondary | ICD-10-CM | POA: Diagnosis not present

## 2023-09-14 DIAGNOSIS — M109 Gout, unspecified: Secondary | ICD-10-CM | POA: Diagnosis not present

## 2023-09-15 DIAGNOSIS — Z5111 Encounter for antineoplastic chemotherapy: Secondary | ICD-10-CM | POA: Diagnosis not present

## 2023-09-15 DIAGNOSIS — R531 Weakness: Secondary | ICD-10-CM | POA: Diagnosis not present

## 2023-09-15 DIAGNOSIS — R5383 Other fatigue: Secondary | ICD-10-CM | POA: Diagnosis not present

## 2023-09-15 DIAGNOSIS — C9200 Acute myeloblastic leukemia, not having achieved remission: Secondary | ICD-10-CM | POA: Diagnosis not present

## 2023-09-16 DIAGNOSIS — R531 Weakness: Secondary | ICD-10-CM | POA: Diagnosis not present

## 2023-09-16 DIAGNOSIS — R5383 Other fatigue: Secondary | ICD-10-CM | POA: Diagnosis not present

## 2023-09-16 DIAGNOSIS — C9200 Acute myeloblastic leukemia, not having achieved remission: Secondary | ICD-10-CM | POA: Diagnosis not present

## 2023-09-17 DIAGNOSIS — R5383 Other fatigue: Secondary | ICD-10-CM | POA: Diagnosis not present

## 2023-09-17 DIAGNOSIS — R531 Weakness: Secondary | ICD-10-CM | POA: Diagnosis not present

## 2023-09-17 DIAGNOSIS — Z5111 Encounter for antineoplastic chemotherapy: Secondary | ICD-10-CM | POA: Diagnosis not present

## 2023-09-17 DIAGNOSIS — C9200 Acute myeloblastic leukemia, not having achieved remission: Secondary | ICD-10-CM | POA: Diagnosis not present

## 2023-09-18 DIAGNOSIS — C9200 Acute myeloblastic leukemia, not having achieved remission: Secondary | ICD-10-CM | POA: Diagnosis not present

## 2023-09-19 DIAGNOSIS — C9200 Acute myeloblastic leukemia, not having achieved remission: Secondary | ICD-10-CM | POA: Diagnosis not present

## 2023-09-26 DIAGNOSIS — C9200 Acute myeloblastic leukemia, not having achieved remission: Secondary | ICD-10-CM | POA: Diagnosis not present

## 2023-10-03 DIAGNOSIS — C9200 Acute myeloblastic leukemia, not having achieved remission: Secondary | ICD-10-CM | POA: Diagnosis not present

## 2023-10-04 DIAGNOSIS — C9200 Acute myeloblastic leukemia, not having achieved remission: Secondary | ICD-10-CM | POA: Diagnosis not present

## 2023-10-10 DIAGNOSIS — C9200 Acute myeloblastic leukemia, not having achieved remission: Secondary | ICD-10-CM | POA: Diagnosis not present

## 2023-10-17 DIAGNOSIS — Z713 Dietary counseling and surveillance: Secondary | ICD-10-CM | POA: Diagnosis not present

## 2023-10-17 DIAGNOSIS — R634 Abnormal weight loss: Secondary | ICD-10-CM | POA: Diagnosis not present

## 2023-10-17 DIAGNOSIS — M6284 Sarcopenia: Secondary | ICD-10-CM | POA: Diagnosis not present

## 2023-10-17 DIAGNOSIS — D469 Myelodysplastic syndrome, unspecified: Secondary | ICD-10-CM | POA: Diagnosis not present

## 2023-10-17 DIAGNOSIS — K5900 Constipation, unspecified: Secondary | ICD-10-CM | POA: Diagnosis not present

## 2023-10-17 DIAGNOSIS — C9200 Acute myeloblastic leukemia, not having achieved remission: Secondary | ICD-10-CM | POA: Diagnosis not present

## 2023-10-17 DIAGNOSIS — D869 Sarcoidosis, unspecified: Secondary | ICD-10-CM | POA: Diagnosis not present

## 2023-10-17 DIAGNOSIS — Z880 Allergy status to penicillin: Secondary | ICD-10-CM | POA: Diagnosis not present

## 2023-10-17 DIAGNOSIS — Z887 Allergy status to serum and vaccine status: Secondary | ICD-10-CM | POA: Diagnosis not present

## 2023-10-24 DIAGNOSIS — R531 Weakness: Secondary | ICD-10-CM | POA: Diagnosis not present

## 2023-10-24 DIAGNOSIS — D469 Myelodysplastic syndrome, unspecified: Secondary | ICD-10-CM | POA: Diagnosis not present

## 2023-10-24 DIAGNOSIS — T451X5A Adverse effect of antineoplastic and immunosuppressive drugs, initial encounter: Secondary | ICD-10-CM | POA: Diagnosis not present

## 2023-10-24 DIAGNOSIS — C9200 Acute myeloblastic leukemia, not having achieved remission: Secondary | ICD-10-CM | POA: Diagnosis not present

## 2023-10-24 DIAGNOSIS — R5383 Other fatigue: Secondary | ICD-10-CM | POA: Diagnosis not present

## 2023-10-24 DIAGNOSIS — D696 Thrombocytopenia, unspecified: Secondary | ICD-10-CM | POA: Diagnosis not present

## 2023-10-24 DIAGNOSIS — D860 Sarcoidosis of lung: Secondary | ICD-10-CM | POA: Diagnosis not present

## 2023-10-24 DIAGNOSIS — D61810 Antineoplastic chemotherapy induced pancytopenia: Secondary | ICD-10-CM | POA: Diagnosis not present

## 2023-10-31 DIAGNOSIS — R5383 Other fatigue: Secondary | ICD-10-CM | POA: Diagnosis not present

## 2023-10-31 DIAGNOSIS — Z5111 Encounter for antineoplastic chemotherapy: Secondary | ICD-10-CM | POA: Diagnosis not present

## 2023-10-31 DIAGNOSIS — R531 Weakness: Secondary | ICD-10-CM | POA: Diagnosis not present

## 2023-10-31 DIAGNOSIS — C9200 Acute myeloblastic leukemia, not having achieved remission: Secondary | ICD-10-CM | POA: Diagnosis not present

## 2023-11-01 DIAGNOSIS — R531 Weakness: Secondary | ICD-10-CM | POA: Diagnosis not present

## 2023-11-01 DIAGNOSIS — R5383 Other fatigue: Secondary | ICD-10-CM | POA: Diagnosis not present

## 2023-11-01 DIAGNOSIS — C9200 Acute myeloblastic leukemia, not having achieved remission: Secondary | ICD-10-CM | POA: Diagnosis not present

## 2023-11-01 DIAGNOSIS — Z5111 Encounter for antineoplastic chemotherapy: Secondary | ICD-10-CM | POA: Diagnosis not present

## 2023-11-02 DIAGNOSIS — Z5111 Encounter for antineoplastic chemotherapy: Secondary | ICD-10-CM | POA: Diagnosis not present

## 2023-11-02 DIAGNOSIS — R5383 Other fatigue: Secondary | ICD-10-CM | POA: Diagnosis not present

## 2023-11-02 DIAGNOSIS — R531 Weakness: Secondary | ICD-10-CM | POA: Diagnosis not present

## 2023-11-02 DIAGNOSIS — Z95828 Presence of other vascular implants and grafts: Secondary | ICD-10-CM | POA: Diagnosis not present

## 2023-11-02 DIAGNOSIS — C9200 Acute myeloblastic leukemia, not having achieved remission: Secondary | ICD-10-CM | POA: Diagnosis not present

## 2023-11-03 DIAGNOSIS — R531 Weakness: Secondary | ICD-10-CM | POA: Diagnosis not present

## 2023-11-03 DIAGNOSIS — C9200 Acute myeloblastic leukemia, not having achieved remission: Secondary | ICD-10-CM | POA: Diagnosis not present

## 2023-11-03 DIAGNOSIS — R5383 Other fatigue: Secondary | ICD-10-CM | POA: Diagnosis not present

## 2023-11-03 DIAGNOSIS — Z5111 Encounter for antineoplastic chemotherapy: Secondary | ICD-10-CM | POA: Diagnosis not present

## 2023-11-04 DIAGNOSIS — R5383 Other fatigue: Secondary | ICD-10-CM | POA: Diagnosis not present

## 2023-11-04 DIAGNOSIS — R531 Weakness: Secondary | ICD-10-CM | POA: Diagnosis not present

## 2023-11-04 DIAGNOSIS — C9200 Acute myeloblastic leukemia, not having achieved remission: Secondary | ICD-10-CM | POA: Diagnosis not present

## 2023-11-14 DIAGNOSIS — D696 Thrombocytopenia, unspecified: Secondary | ICD-10-CM | POA: Diagnosis not present

## 2023-11-14 DIAGNOSIS — C9200 Acute myeloblastic leukemia, not having achieved remission: Secondary | ICD-10-CM | POA: Diagnosis not present

## 2023-11-21 DIAGNOSIS — R5383 Other fatigue: Secondary | ICD-10-CM | POA: Diagnosis not present

## 2023-11-21 DIAGNOSIS — C9200 Acute myeloblastic leukemia, not having achieved remission: Secondary | ICD-10-CM | POA: Diagnosis not present

## 2023-11-21 DIAGNOSIS — R531 Weakness: Secondary | ICD-10-CM | POA: Diagnosis not present

## 2023-11-28 DIAGNOSIS — C9200 Acute myeloblastic leukemia, not having achieved remission: Secondary | ICD-10-CM | POA: Diagnosis not present

## 2023-11-28 DIAGNOSIS — R531 Weakness: Secondary | ICD-10-CM | POA: Diagnosis not present

## 2023-11-28 DIAGNOSIS — D696 Thrombocytopenia, unspecified: Secondary | ICD-10-CM | POA: Diagnosis not present

## 2023-11-28 DIAGNOSIS — D701 Agranulocytosis secondary to cancer chemotherapy: Secondary | ICD-10-CM | POA: Diagnosis not present

## 2023-11-28 DIAGNOSIS — T451X5A Adverse effect of antineoplastic and immunosuppressive drugs, initial encounter: Secondary | ICD-10-CM | POA: Diagnosis not present

## 2023-11-28 DIAGNOSIS — Z5111 Encounter for antineoplastic chemotherapy: Secondary | ICD-10-CM | POA: Diagnosis not present

## 2023-11-28 DIAGNOSIS — D630 Anemia in neoplastic disease: Secondary | ICD-10-CM | POA: Diagnosis not present

## 2023-11-28 DIAGNOSIS — D469 Myelodysplastic syndrome, unspecified: Secondary | ICD-10-CM | POA: Diagnosis not present

## 2023-11-28 DIAGNOSIS — D61818 Other pancytopenia: Secondary | ICD-10-CM | POA: Diagnosis not present

## 2023-11-28 DIAGNOSIS — R5383 Other fatigue: Secondary | ICD-10-CM | POA: Diagnosis not present

## 2023-11-29 DIAGNOSIS — R531 Weakness: Secondary | ICD-10-CM | POA: Diagnosis not present

## 2023-11-29 DIAGNOSIS — C9200 Acute myeloblastic leukemia, not having achieved remission: Secondary | ICD-10-CM | POA: Diagnosis not present

## 2023-11-29 DIAGNOSIS — R5383 Other fatigue: Secondary | ICD-10-CM | POA: Diagnosis not present

## 2023-11-30 DIAGNOSIS — R531 Weakness: Secondary | ICD-10-CM | POA: Diagnosis not present

## 2023-11-30 DIAGNOSIS — Z5111 Encounter for antineoplastic chemotherapy: Secondary | ICD-10-CM | POA: Diagnosis not present

## 2023-11-30 DIAGNOSIS — C9200 Acute myeloblastic leukemia, not having achieved remission: Secondary | ICD-10-CM | POA: Diagnosis not present

## 2023-11-30 DIAGNOSIS — R5383 Other fatigue: Secondary | ICD-10-CM | POA: Diagnosis not present

## 2023-12-01 DIAGNOSIS — Z7189 Other specified counseling: Secondary | ICD-10-CM | POA: Diagnosis not present

## 2023-12-01 DIAGNOSIS — R5383 Other fatigue: Secondary | ICD-10-CM | POA: Diagnosis not present

## 2023-12-01 DIAGNOSIS — Z5111 Encounter for antineoplastic chemotherapy: Secondary | ICD-10-CM | POA: Diagnosis not present

## 2023-12-01 DIAGNOSIS — Z515 Encounter for palliative care: Secondary | ICD-10-CM | POA: Diagnosis not present

## 2023-12-01 DIAGNOSIS — C9200 Acute myeloblastic leukemia, not having achieved remission: Secondary | ICD-10-CM | POA: Diagnosis not present

## 2023-12-01 DIAGNOSIS — R530 Neoplastic (malignant) related fatigue: Secondary | ICD-10-CM | POA: Diagnosis not present

## 2023-12-01 DIAGNOSIS — R531 Weakness: Secondary | ICD-10-CM | POA: Diagnosis not present

## 2023-12-02 DIAGNOSIS — C9200 Acute myeloblastic leukemia, not having achieved remission: Secondary | ICD-10-CM | POA: Diagnosis not present

## 2023-12-02 DIAGNOSIS — Z5111 Encounter for antineoplastic chemotherapy: Secondary | ICD-10-CM | POA: Diagnosis not present

## 2023-12-02 DIAGNOSIS — R531 Weakness: Secondary | ICD-10-CM | POA: Diagnosis not present

## 2023-12-02 DIAGNOSIS — R5383 Other fatigue: Secondary | ICD-10-CM | POA: Diagnosis not present

## 2023-12-05 DIAGNOSIS — C9200 Acute myeloblastic leukemia, not having achieved remission: Secondary | ICD-10-CM | POA: Diagnosis not present

## 2023-12-12 DIAGNOSIS — C9200 Acute myeloblastic leukemia, not having achieved remission: Secondary | ICD-10-CM | POA: Diagnosis not present

## 2023-12-12 DIAGNOSIS — D696 Thrombocytopenia, unspecified: Secondary | ICD-10-CM | POA: Diagnosis not present

## 2023-12-19 DIAGNOSIS — C9200 Acute myeloblastic leukemia, not having achieved remission: Secondary | ICD-10-CM | POA: Diagnosis not present

## 2023-12-19 DIAGNOSIS — D696 Thrombocytopenia, unspecified: Secondary | ICD-10-CM | POA: Diagnosis not present

## 2023-12-21 DIAGNOSIS — C9200 Acute myeloblastic leukemia, not having achieved remission: Secondary | ICD-10-CM | POA: Diagnosis not present

## 2023-12-21 DIAGNOSIS — D696 Thrombocytopenia, unspecified: Secondary | ICD-10-CM | POA: Diagnosis not present

## 2023-12-27 DIAGNOSIS — D860 Sarcoidosis of lung: Secondary | ICD-10-CM | POA: Diagnosis not present

## 2023-12-27 DIAGNOSIS — R530 Neoplastic (malignant) related fatigue: Secondary | ICD-10-CM | POA: Diagnosis not present

## 2023-12-27 DIAGNOSIS — D469 Myelodysplastic syndrome, unspecified: Secondary | ICD-10-CM | POA: Diagnosis not present

## 2023-12-27 DIAGNOSIS — C9200 Acute myeloblastic leukemia, not having achieved remission: Secondary | ICD-10-CM | POA: Diagnosis not present

## 2023-12-27 DIAGNOSIS — D696 Thrombocytopenia, unspecified: Secondary | ICD-10-CM | POA: Diagnosis not present

## 2023-12-27 DIAGNOSIS — D630 Anemia in neoplastic disease: Secondary | ICD-10-CM | POA: Diagnosis not present

## 2023-12-27 DIAGNOSIS — R531 Weakness: Secondary | ICD-10-CM | POA: Diagnosis not present

## 2023-12-27 DIAGNOSIS — K5900 Constipation, unspecified: Secondary | ICD-10-CM | POA: Diagnosis not present

## 2023-12-27 DIAGNOSIS — Z880 Allergy status to penicillin: Secondary | ICD-10-CM | POA: Diagnosis not present

## 2023-12-30 DIAGNOSIS — C9200 Acute myeloblastic leukemia, not having achieved remission: Secondary | ICD-10-CM | POA: Diagnosis not present

## 2023-12-30 DIAGNOSIS — D649 Anemia, unspecified: Secondary | ICD-10-CM | POA: Diagnosis not present

## 2023-12-30 DIAGNOSIS — D696 Thrombocytopenia, unspecified: Secondary | ICD-10-CM | POA: Diagnosis not present

## 2024-01-03 DIAGNOSIS — D860 Sarcoidosis of lung: Secondary | ICD-10-CM | POA: Diagnosis not present

## 2024-01-03 DIAGNOSIS — D696 Thrombocytopenia, unspecified: Secondary | ICD-10-CM | POA: Diagnosis not present

## 2024-01-03 DIAGNOSIS — S5012XA Contusion of left forearm, initial encounter: Secondary | ICD-10-CM | POA: Diagnosis not present

## 2024-01-03 DIAGNOSIS — C9200 Acute myeloblastic leukemia, not having achieved remission: Secondary | ICD-10-CM | POA: Diagnosis not present

## 2024-01-03 DIAGNOSIS — R600 Localized edema: Secondary | ICD-10-CM | POA: Diagnosis not present

## 2024-01-03 DIAGNOSIS — Z515 Encounter for palliative care: Secondary | ICD-10-CM | POA: Diagnosis not present

## 2024-01-03 DIAGNOSIS — D469 Myelodysplastic syndrome, unspecified: Secondary | ICD-10-CM | POA: Diagnosis not present

## 2024-01-03 DIAGNOSIS — K5900 Constipation, unspecified: Secondary | ICD-10-CM | POA: Diagnosis not present

## 2024-01-03 DIAGNOSIS — R530 Neoplastic (malignant) related fatigue: Secondary | ICD-10-CM | POA: Diagnosis not present

## 2024-01-05 DIAGNOSIS — D696 Thrombocytopenia, unspecified: Secondary | ICD-10-CM | POA: Diagnosis not present

## 2024-01-05 DIAGNOSIS — C9200 Acute myeloblastic leukemia, not having achieved remission: Secondary | ICD-10-CM | POA: Diagnosis not present

## 2024-01-09 DIAGNOSIS — D696 Thrombocytopenia, unspecified: Secondary | ICD-10-CM | POA: Diagnosis not present

## 2024-01-09 DIAGNOSIS — C9200 Acute myeloblastic leukemia, not having achieved remission: Secondary | ICD-10-CM | POA: Diagnosis not present

## 2024-01-12 DIAGNOSIS — C9200 Acute myeloblastic leukemia, not having achieved remission: Secondary | ICD-10-CM | POA: Diagnosis not present

## 2024-01-12 DIAGNOSIS — D696 Thrombocytopenia, unspecified: Secondary | ICD-10-CM | POA: Diagnosis not present

## 2024-01-16 DIAGNOSIS — D696 Thrombocytopenia, unspecified: Secondary | ICD-10-CM | POA: Diagnosis not present

## 2024-01-16 DIAGNOSIS — D630 Anemia in neoplastic disease: Secondary | ICD-10-CM | POA: Diagnosis not present

## 2024-01-16 DIAGNOSIS — C9200 Acute myeloblastic leukemia, not having achieved remission: Secondary | ICD-10-CM | POA: Diagnosis not present

## 2024-01-19 DIAGNOSIS — C9200 Acute myeloblastic leukemia, not having achieved remission: Secondary | ICD-10-CM | POA: Diagnosis not present

## 2024-01-19 DIAGNOSIS — D696 Thrombocytopenia, unspecified: Secondary | ICD-10-CM | POA: Diagnosis not present

## 2024-01-23 DIAGNOSIS — C9200 Acute myeloblastic leukemia, not having achieved remission: Secondary | ICD-10-CM | POA: Diagnosis not present

## 2024-01-23 DIAGNOSIS — D696 Thrombocytopenia, unspecified: Secondary | ICD-10-CM | POA: Diagnosis not present

## 2024-01-25 DIAGNOSIS — D696 Thrombocytopenia, unspecified: Secondary | ICD-10-CM | POA: Diagnosis not present

## 2024-01-25 DIAGNOSIS — C9200 Acute myeloblastic leukemia, not having achieved remission: Secondary | ICD-10-CM | POA: Diagnosis not present

## 2024-01-28 DIAGNOSIS — D696 Thrombocytopenia, unspecified: Secondary | ICD-10-CM | POA: Diagnosis not present

## 2024-01-28 DIAGNOSIS — C9200 Acute myeloblastic leukemia, not having achieved remission: Secondary | ICD-10-CM | POA: Diagnosis not present

## 2024-01-30 DIAGNOSIS — R531 Weakness: Secondary | ICD-10-CM | POA: Diagnosis not present

## 2024-01-30 DIAGNOSIS — R5383 Other fatigue: Secondary | ICD-10-CM | POA: Diagnosis not present

## 2024-01-30 DIAGNOSIS — K5900 Constipation, unspecified: Secondary | ICD-10-CM | POA: Diagnosis not present

## 2024-01-30 DIAGNOSIS — C9200 Acute myeloblastic leukemia, not having achieved remission: Secondary | ICD-10-CM | POA: Diagnosis not present

## 2024-01-30 DIAGNOSIS — R530 Neoplastic (malignant) related fatigue: Secondary | ICD-10-CM | POA: Diagnosis not present

## 2024-01-30 DIAGNOSIS — D860 Sarcoidosis of lung: Secondary | ICD-10-CM | POA: Diagnosis not present

## 2024-01-30 DIAGNOSIS — T8092XA Unspecified transfusion reaction, initial encounter: Secondary | ICD-10-CM | POA: Diagnosis not present

## 2024-01-30 DIAGNOSIS — Z23 Encounter for immunization: Secondary | ICD-10-CM | POA: Diagnosis not present

## 2024-01-30 DIAGNOSIS — D469 Myelodysplastic syndrome, unspecified: Secondary | ICD-10-CM | POA: Diagnosis not present

## 2024-01-30 DIAGNOSIS — D696 Thrombocytopenia, unspecified: Secondary | ICD-10-CM | POA: Diagnosis not present

## 2024-01-31 DIAGNOSIS — Z8673 Personal history of transient ischemic attack (TIA), and cerebral infarction without residual deficits: Secondary | ICD-10-CM | POA: Diagnosis not present

## 2024-01-31 DIAGNOSIS — Z0000 Encounter for general adult medical examination without abnormal findings: Secondary | ICD-10-CM | POA: Diagnosis not present

## 2024-01-31 DIAGNOSIS — Z1211 Encounter for screening for malignant neoplasm of colon: Secondary | ICD-10-CM | POA: Diagnosis not present

## 2024-01-31 DIAGNOSIS — R600 Localized edema: Secondary | ICD-10-CM | POA: Diagnosis not present

## 2024-01-31 DIAGNOSIS — D696 Thrombocytopenia, unspecified: Secondary | ICD-10-CM | POA: Diagnosis not present

## 2024-01-31 DIAGNOSIS — Z1331 Encounter for screening for depression: Secondary | ICD-10-CM | POA: Diagnosis not present

## 2024-01-31 DIAGNOSIS — N400 Enlarged prostate without lower urinary tract symptoms: Secondary | ICD-10-CM | POA: Diagnosis not present

## 2024-01-31 DIAGNOSIS — D862 Sarcoidosis of lung with sarcoidosis of lymph nodes: Secondary | ICD-10-CM | POA: Diagnosis not present

## 2024-01-31 DIAGNOSIS — C9200 Acute myeloblastic leukemia, not having achieved remission: Secondary | ICD-10-CM | POA: Diagnosis not present

## 2024-02-02 DIAGNOSIS — D696 Thrombocytopenia, unspecified: Secondary | ICD-10-CM | POA: Diagnosis not present

## 2024-02-02 DIAGNOSIS — D649 Anemia, unspecified: Secondary | ICD-10-CM | POA: Diagnosis not present

## 2024-02-06 DIAGNOSIS — D469 Myelodysplastic syndrome, unspecified: Secondary | ICD-10-CM | POA: Diagnosis not present

## 2024-02-06 DIAGNOSIS — C9200 Acute myeloblastic leukemia, not having achieved remission: Secondary | ICD-10-CM | POA: Diagnosis not present

## 2024-02-06 DIAGNOSIS — D696 Thrombocytopenia, unspecified: Secondary | ICD-10-CM | POA: Diagnosis not present

## 2024-02-06 DIAGNOSIS — Z452 Encounter for adjustment and management of vascular access device: Secondary | ICD-10-CM | POA: Diagnosis not present

## 2024-02-06 DIAGNOSIS — Z79899 Other long term (current) drug therapy: Secondary | ICD-10-CM | POA: Diagnosis not present

## 2024-02-06 DIAGNOSIS — Z880 Allergy status to penicillin: Secondary | ICD-10-CM | POA: Diagnosis not present

## 2024-02-06 DIAGNOSIS — Z792 Long term (current) use of antibiotics: Secondary | ICD-10-CM | POA: Diagnosis not present

## 2024-02-09 DIAGNOSIS — D696 Thrombocytopenia, unspecified: Secondary | ICD-10-CM | POA: Diagnosis not present

## 2024-02-09 DIAGNOSIS — C9200 Acute myeloblastic leukemia, not having achieved remission: Secondary | ICD-10-CM | POA: Diagnosis not present

## 2024-02-09 DIAGNOSIS — D630 Anemia in neoplastic disease: Secondary | ICD-10-CM | POA: Diagnosis not present

## 2024-02-13 DIAGNOSIS — D696 Thrombocytopenia, unspecified: Secondary | ICD-10-CM | POA: Diagnosis not present

## 2024-02-13 DIAGNOSIS — C9200 Acute myeloblastic leukemia, not having achieved remission: Secondary | ICD-10-CM | POA: Diagnosis not present
